# Patient Record
Sex: Female | Born: 1955 | Race: White | Hispanic: No | Marital: Single | Smoking: Never smoker
Health system: Southern US, Community
[De-identification: ages and names within clinical notes are randomized; demographics above are authoritative.]

## PROBLEM LIST (undated history)

## (undated) DIAGNOSIS — I447 Left bundle-branch block, unspecified: Secondary | ICD-10-CM

## (undated) DIAGNOSIS — I10 Essential (primary) hypertension: Secondary | ICD-10-CM

## (undated) DIAGNOSIS — F32A Depression, unspecified: Secondary | ICD-10-CM

## (undated) DIAGNOSIS — E039 Hypothyroidism, unspecified: Secondary | ICD-10-CM

## (undated) DIAGNOSIS — F419 Anxiety disorder, unspecified: Secondary | ICD-10-CM

## (undated) DIAGNOSIS — K760 Fatty (change of) liver, not elsewhere classified: Secondary | ICD-10-CM

## (undated) DIAGNOSIS — G43909 Migraine, unspecified, not intractable, without status migrainosus: Secondary | ICD-10-CM

## (undated) DIAGNOSIS — F329 Major depressive disorder, single episode, unspecified: Secondary | ICD-10-CM

## (undated) DIAGNOSIS — R011 Cardiac murmur, unspecified: Secondary | ICD-10-CM

## (undated) DIAGNOSIS — E669 Obesity, unspecified: Secondary | ICD-10-CM

## (undated) DIAGNOSIS — G4733 Obstructive sleep apnea (adult) (pediatric): Secondary | ICD-10-CM

## (undated) DIAGNOSIS — J309 Allergic rhinitis, unspecified: Secondary | ICD-10-CM

## (undated) DIAGNOSIS — R519 Headache, unspecified: Secondary | ICD-10-CM

## (undated) DIAGNOSIS — E559 Vitamin D deficiency, unspecified: Secondary | ICD-10-CM

## (undated) DIAGNOSIS — E785 Hyperlipidemia, unspecified: Secondary | ICD-10-CM

## (undated) DIAGNOSIS — I5189 Other ill-defined heart diseases: Secondary | ICD-10-CM

## (undated) DIAGNOSIS — H9319 Tinnitus, unspecified ear: Secondary | ICD-10-CM

## (undated) DIAGNOSIS — T148XXA Other injury of unspecified body region, initial encounter: Secondary | ICD-10-CM

## (undated) DIAGNOSIS — R51 Headache: Secondary | ICD-10-CM

## (undated) DIAGNOSIS — R7303 Prediabetes: Secondary | ICD-10-CM

## (undated) DIAGNOSIS — M1990 Unspecified osteoarthritis, unspecified site: Secondary | ICD-10-CM

## (undated) DIAGNOSIS — D126 Benign neoplasm of colon, unspecified: Secondary | ICD-10-CM

## (undated) DIAGNOSIS — I8390 Asymptomatic varicose veins of unspecified lower extremity: Secondary | ICD-10-CM

## (undated) DIAGNOSIS — G5600 Carpal tunnel syndrome, unspecified upper limb: Secondary | ICD-10-CM

## (undated) DIAGNOSIS — K219 Gastro-esophageal reflux disease without esophagitis: Secondary | ICD-10-CM

## (undated) HISTORY — DX: Vitamin D deficiency, unspecified: E55.9

## (undated) HISTORY — PX: TONSILECTOMY, ADENOIDECTOMY, BILATERAL MYRINGOTOMY AND TUBES: SHX2538

## (undated) HISTORY — DX: Other ill-defined heart diseases: I51.89

## (undated) HISTORY — DX: Left bundle-branch block, unspecified: I44.7

## (undated) HISTORY — DX: Anxiety disorder, unspecified: F41.9

## (undated) HISTORY — DX: Obstructive sleep apnea (adult) (pediatric): G47.33

## (undated) HISTORY — DX: Hypothyroidism, unspecified: E03.9

## (undated) HISTORY — DX: Depression, unspecified: F32.A

## (undated) HISTORY — DX: Gastro-esophageal reflux disease without esophagitis: K21.9

## (undated) HISTORY — DX: Cardiac murmur, unspecified: R01.1

## (undated) HISTORY — DX: Migraine, unspecified, not intractable, without status migrainosus: G43.909

## (undated) HISTORY — DX: Allergic rhinitis, unspecified: J30.9

## (undated) HISTORY — DX: Headache: R51

## (undated) HISTORY — PX: OTHER SURGICAL HISTORY: SHX169

## (undated) HISTORY — DX: Fatty (change of) liver, not elsewhere classified: K76.0

## (undated) HISTORY — DX: Carpal tunnel syndrome, unspecified upper limb: G56.00

## (undated) HISTORY — DX: Headache, unspecified: R51.9

## (undated) HISTORY — DX: Obesity, unspecified: E66.9

## (undated) HISTORY — DX: Tinnitus, unspecified ear: H93.19

## (undated) HISTORY — DX: Other injury of unspecified body region, initial encounter: T14.8XXA

## (undated) HISTORY — DX: Major depressive disorder, single episode, unspecified: F32.9

## (undated) HISTORY — PX: DERMOID CYST  EXCISION: SHX1452

## (undated) HISTORY — DX: Asymptomatic varicose veins of unspecified lower extremity: I83.90

## (undated) HISTORY — DX: Prediabetes: R73.03

## (undated) HISTORY — DX: Hyperlipidemia, unspecified: E78.5

## (undated) HISTORY — DX: Benign neoplasm of colon, unspecified: D12.6

## (undated) HISTORY — DX: Unspecified osteoarthritis, unspecified site: M19.90

## (undated) HISTORY — DX: Essential (primary) hypertension: I10

---

## 1999-04-04 ENCOUNTER — Encounter: Payer: Self-pay | Admitting: Obstetrics and Gynecology

## 1999-04-04 ENCOUNTER — Ambulatory Visit (HOSPITAL_COMMUNITY): Admission: RE | Admit: 1999-04-04 | Discharge: 1999-04-04 | Payer: Self-pay | Admitting: Obstetrics and Gynecology

## 1999-08-02 ENCOUNTER — Inpatient Hospital Stay (HOSPITAL_COMMUNITY): Admission: AD | Admit: 1999-08-02 | Discharge: 1999-08-03 | Payer: Self-pay | Admitting: Obstetrics and Gynecology

## 2000-04-02 ENCOUNTER — Ambulatory Visit (HOSPITAL_COMMUNITY): Admission: RE | Admit: 2000-04-02 | Discharge: 2000-04-02 | Payer: Self-pay | Admitting: Obstetrics and Gynecology

## 2000-04-02 ENCOUNTER — Encounter: Payer: Self-pay | Admitting: Obstetrics and Gynecology

## 2000-06-26 ENCOUNTER — Encounter: Admission: RE | Admit: 2000-06-26 | Discharge: 2000-06-26 | Payer: Self-pay | Admitting: Internal Medicine

## 2000-06-26 ENCOUNTER — Encounter: Payer: Self-pay | Admitting: Internal Medicine

## 2002-04-03 ENCOUNTER — Other Ambulatory Visit: Admission: RE | Admit: 2002-04-03 | Discharge: 2002-04-03 | Payer: Self-pay | Admitting: Obstetrics and Gynecology

## 2002-06-17 ENCOUNTER — Ambulatory Visit (HOSPITAL_COMMUNITY): Admission: RE | Admit: 2002-06-17 | Discharge: 2002-06-17 | Payer: Self-pay | Admitting: Internal Medicine

## 2003-04-14 ENCOUNTER — Other Ambulatory Visit: Admission: RE | Admit: 2003-04-14 | Discharge: 2003-04-14 | Payer: Self-pay | Admitting: Obstetrics and Gynecology

## 2004-04-21 ENCOUNTER — Other Ambulatory Visit: Admission: RE | Admit: 2004-04-21 | Discharge: 2004-04-21 | Payer: Self-pay | Admitting: Obstetrics and Gynecology

## 2011-03-01 ENCOUNTER — Other Ambulatory Visit: Payer: Self-pay | Admitting: Otolaryngology

## 2011-03-07 ENCOUNTER — Ambulatory Visit
Admission: RE | Admit: 2011-03-07 | Discharge: 2011-03-07 | Disposition: A | Discharge status: Home / Self Care | Payer: Managed Care, Other (non HMO) | Source: Ambulatory Visit | Attending: Otolaryngology | Admitting: Otolaryngology

## 2011-06-15 ENCOUNTER — Encounter: Payer: Self-pay | Admitting: Cardiovascular Disease

## 2011-06-15 ENCOUNTER — Ambulatory Visit (INDEPENDENT_AMBULATORY_CARE_PROVIDER_SITE_OTHER): Payer: Managed Care, Other (non HMO) | Admitting: Cardiovascular Disease

## 2011-06-15 VITALS — BP 128/80 | HR 64 | Ht 64.5 in | Wt 242.0 lb

## 2011-06-15 DIAGNOSIS — I447 Left bundle-branch block, unspecified: Secondary | ICD-10-CM

## 2011-06-15 NOTE — Progress Notes (Signed)
Carolyn Mcguire Date of Birth  07/18/1956 [facility] / [facility] [address]     [address] [place], [place]  [address] [phone]  Fax  [phone]  History of Present Illness:  Healthy middle age female,  No chest pain.  Gets dyspnea with activity.  Does not exercise regularly.  An ECG at a recent physical revealed a LBBB.  She has not had any cardiac problems.  Current Outpatient Prescriptions  Medication Sig Dispense Refill  . ALPRAZolam (XANAX) 1 MG tablet Take 1 mg by mouth at bedtime as needed.        Marland Kitchen levothyroxine (SYNTHROID, LEVOTHROID) 100 MCG tablet Take 100 mcg by mouth daily.        . NORETHINDRONE PO Take 0.35 mg by mouth daily.        . rosuvastatin (CRESTOR) 40 MG tablet Take 40 mg by mouth daily. TAKE 1/4       . sertraline (ZOLOFT) 50 MG tablet Take 25 mg by mouth daily.        . valsartan (DIOVAN) 320 MG tablet Take 320 mg by mouth daily. 1/2 TABLET          Allergies  Allergen Reactions  . Sulfur     MAKES PT RED    Past Medical History  Diagnosis Date  . Hypertension   . Hyperlipidemia   . Carpal tunnel syndrome     RIGHT HAND    Past Surgical History  Procedure Date  . Hemithyroidectomy   . Rupture belly button     WHEN SHE WAS AN INFANT  . Dermoid cyst  excision     OF HER RIGHT OVARY. SIZE OF A GRAPEFRUIT. OVARY IS STILL THERE.  . Tonsilectomy, adenoidectomy, bilateral myringotomy and tubes     AGE 23    History  Smoking status  . Never Smoker   Smokeless tobacco  . Not on file    History  Alcohol Use No    History reviewed. No pertinent family history.  Reviw of Systems:  Reviewed in the HPI.  All other systems are negative.  Physical Exam: BP 128/80  Pulse 64  Ht 5' 4.5" (1.638 m)  Wt 242 lb (109.77 kg)  BMI 40.90 kg/m2 The patient is alert and oriented x 3.  The mood and affect are normal.   Skin: warm and dry.  Color is normal.    HEENT:   the sclera are nonicteric.  The mucous  membranes are moist.  The carotids are 2+ without bruits.  There is no thyromegaly.  There is no JVD.    Lungs: clear.  The chest wall is non tender.    Heart: regular rate with a normal S1 and S2.  There are no murmurs, gallops, or rubs. The PMI is not displaced.     Abdomin: good bowel sounds.  There is no guarding or rebound.  There is no hepatosplenomegaly or tenderness.  There are no masses.   Extremities:  no clubbing, cyanosis, or edema.  The legs are without rashes.  The distal pulses are intact.   Neuro:  Cranial nerves II - XII are intact.  Motor and sensory functions are intact.    The gait is normal.  ECG: NSR. LBBB  Assessment / Plan:

## 2011-06-15 NOTE — Assessment & Plan Note (Signed)
Carolyn Mcguire presents with an asymptomatic LBBB.  We will get an echocardiogram for further evaluation of her LV function.  We will also need to get a Lexiscan myoview to rule out coronary ischemia as a cause of her LBBB.  She will follow up with [place] in several months.

## 2011-06-18 ENCOUNTER — Encounter: Payer: Self-pay | Admitting: Cardiovascular Disease

## 2011-06-19 ENCOUNTER — Encounter: Payer: Self-pay | Admitting: *Deleted

## 2011-07-02 ENCOUNTER — Other Ambulatory Visit (HOSPITAL_COMMUNITY): Payer: Managed Care, Other (non HMO) | Admitting: Radiology

## 2011-07-05 ENCOUNTER — Ambulatory Visit (HOSPITAL_COMMUNITY): Payer: Managed Care, Other (non HMO) | Attending: Cardiovascular Disease | Admitting: Radiology

## 2011-07-05 ENCOUNTER — Ambulatory Visit (HOSPITAL_BASED_OUTPATIENT_CLINIC_OR_DEPARTMENT_OTHER): Payer: Managed Care, Other (non HMO) | Admitting: Radiology

## 2011-07-05 VITALS — Ht 64.0 in | Wt 240.0 lb

## 2011-07-05 DIAGNOSIS — I447 Left bundle-branch block, unspecified: Secondary | ICD-10-CM

## 2011-07-05 DIAGNOSIS — R0989 Other specified symptoms and signs involving the circulatory and respiratory systems: Secondary | ICD-10-CM

## 2011-07-05 MED ORDER — TECHNETIUM TC 99M TETROFOSMIN IV KIT
33.0000 | PACK | Freq: Once | INTRAVENOUS | Status: AC | PRN
  Administered 2011-07-05: 33 via INTRAVENOUS

## 2011-07-05 MED ORDER — ADENOSINE (DIAGNOSTIC) 3 MG/ML IV SOLN
0.5600 mg/kg | Freq: Once | INTRAVENOUS | Status: AC
  Administered 2011-07-05: 60.9 mg via INTRAVENOUS

## 2011-07-05 NOTE — Progress Notes (Signed)
[facility] SITE 3 NUCLEAR MED [address] [place] [address] [phone]  Cardiology Nuclear Med Study  Carolyn Mcguire is a 55 y.o. female [id number] Sep 25, 1956   Nuclear Med Background Indication for Stress Test:  Evaluation for Ischemia and Abnormal EKG: New LBBB History:  No previous documented CAD Cardiac Risk Factors: Family History - CAD, Hypertension, LBBB and Lipids  Symptoms:  DOE and Rapid HR   Nuclear Pre-Procedure Caffeine/Decaff Intake:  None NPO After: 8:00pm   Lungs:  Clear IV 0.9% NS with Angio Cath:  24g  IV Site: L Antecubital  IV Started by:  Irean Hong, RN  Chest Size (in):  44 Cup Size: DD  Height: 5\' 4"  (1.626 m)  Weight:  240 lb (108.863 kg)  BMI:  Body mass index is 41.20 kg/(m^2). Tech Comments:  n/a    Nuclear Med Study 1 or 2 day study: 2 day  Stress Test Type:  Adenosine  Reading MD: Cassell Clement, MD  Order Authorizing Provider:  Laurice Record, MD  Resting Radionuclide: Technetium 62m Tetrofosmin  Resting Radionuclide Dose: 33.0 mCi   Stress Radionuclide:  Technetium 40m Tetrofosmin  Stress Radionuclide Dose: 33.0 mCi           Stress Protocol Rest HR: 65 Stress HR: 85  Rest BP: 140/88 Stress BP: 122/70  Exercise Time (min): n/a METS: n/a   Predicted Max HR: 168 bpm % Max HR: 50.6 bpm Rate Pressure Product: 10272   Dose of Adenosine (mg):  60.0 Dose of Lexiscan: n/a mg  Dose of Atropine (mg): n/a Dose of Dobutamine: n/a mcg/kg/min (at max HR)  Stress Test Technologist: Irean Hong, RN  Nuclear Technologist:  Doyne Keel, CNMT     Rest Procedure:  Myocardial perfusion imaging was performed at rest 45 minutes following the intravenous administration of Technetium 74m Tetrofosmin. Rest ECG: NSR with poor R wave progression; LBBB  Stress Procedure:  The patient received IV adenosine at 140 mcg/kg/min for 4 minutes.  The EKG was nondiagnostic due to baseline LBBB.  Technetium 51m Tetrofosmin was injected  at the 2 minute mark and quantitative spect images were obtained after a 45 minute delay. Stress ECG: No significant change from baseline ECG  QPS Raw Data Images:  Mild breast attenuation.  Normal left ventricular size. Stress Images:  There is mild anterior and apical attenuation with normal uptake in the other regions. Rest Images:  There is mild anterior and apical attenuation with normal uptake in the other regions Subtraction (SDS):  No evidence of ischemia. Transient Ischemic Dilatation (Normal <1.22):  1.03 Lung/Heart Ratio (Normal <0.45):  0.40  Quantitative Gated Spect Images QGS EDV:  98 ml QGS ESV:  33 ml QGS cine images:  NL LV Function; NL Wall Motion QGS EF: 66%  Impression Exercise Capacity:  Adenosine study with no exercise. BP Response:  Normal blood pressure response. Clinical Symptoms:  No chest pain. ECG Impression:  No significant ST segment change suggestive of ischemia. Comparison with Prior Nuclear Study: No previous nuclear study performed  Overall Impression:  Normal stress nuclear study.  No evidence of ischemia. There is mild breast attenuation.   Normal LV function.    Vesta Mixer, Montez Hageman., MD, [facility]

## 2011-07-10 ENCOUNTER — Telehealth: Payer: Self-pay | Admitting: Cardiology

## 2011-07-10 NOTE — Telephone Encounter (Signed)
Patient received statement, calling regarding getting bill and advised had pd with [employer].  Advised must have crossed in mail.  No balance for 05/2011 dos.  Confirmed [employer] posted 53.21 on account.  Patient ok.  stmt date 07/03/11, posted 07/05/11.  Crossed in mail.  Patient ok.

## 2011-07-16 ENCOUNTER — Ambulatory Visit (HOSPITAL_COMMUNITY): Payer: Managed Care, Other (non HMO) | Attending: Cardiovascular Disease | Admitting: Radiology

## 2011-07-16 DIAGNOSIS — R0600 Dyspnea, unspecified: Secondary | ICD-10-CM

## 2011-07-16 DIAGNOSIS — R0989 Other specified symptoms and signs involving the circulatory and respiratory systems: Secondary | ICD-10-CM

## 2011-07-16 MED ORDER — TECHNETIUM TC 99M TETROFOSMIN IV KIT
33.0000 | PACK | Freq: Once | INTRAVENOUS | Status: AC | PRN
  Administered 2011-07-16: 33 via INTRAVENOUS

## 2011-07-25 ENCOUNTER — Telehealth: Payer: Self-pay | Admitting: Cardiovascular Disease

## 2011-07-25 NOTE — Telephone Encounter (Signed)
Needs last consult on 7/20 faxed over.

## 2011-07-25 NOTE — Telephone Encounter (Signed)
Consult for 7/20 faxed today

## 2011-09-14 ENCOUNTER — Ambulatory Visit (INDEPENDENT_AMBULATORY_CARE_PROVIDER_SITE_OTHER): Payer: Managed Care, Other (non HMO) | Admitting: Cardiovascular Disease

## 2011-09-14 ENCOUNTER — Encounter: Payer: Self-pay | Admitting: Cardiovascular Disease

## 2011-09-14 VITALS — BP 129/70 | HR 75 | Ht 64.5 in | Wt 233.8 lb

## 2011-09-14 DIAGNOSIS — I447 Left bundle-branch block, unspecified: Secondary | ICD-10-CM

## 2011-09-14 NOTE — Assessment & Plan Note (Addendum)
She has had an unremarkable workup.  Will see her in 1 year.  We'll continue to follow her left bundle branch block

## 2011-09-14 NOTE — Progress Notes (Signed)
Carolyn Mcguire Date of Birth  November 05, 1956 [facility] [address]    [address] [place], [place]  [address] [phone]  Fax  [phone]  History of Present Illness:  55 yo female with a hx of LBBB.  Stress myoview was negative.  She's been on a diet and exercise program and has lost about 20 pounds since last first.   Current Outpatient Prescriptions on File Prior to Visit  Medication Sig Dispense Refill  . ALPRAZolam (XANAX) 1 MG tablet Take 1 mg by mouth at bedtime as needed.        Marland Kitchen levothyroxine (SYNTHROID, LEVOTHROID) 100 MCG tablet Take 100 mcg by mouth daily.        . NORETHINDRONE PO Take 0.35 mg by mouth daily.        . rosuvastatin (CRESTOR) 40 MG tablet Take 40 mg by mouth daily. TAKE 1/4       . valsartan (DIOVAN) 320 MG tablet Take 320 mg by mouth daily. 1/2 TABLET       . sertraline (ZOLOFT) 50 MG tablet Take 25 mg by mouth daily.          Allergies  Allergen Reactions  . Sulfa Antibiotics Rash    Past Medical History  Diagnosis Date  . Hypertension   . Hyperlipidemia   . Carpal tunnel syndrome     RIGHT HAND    Past Surgical History  Procedure Date  . Hemithyroidectomy   . Rupture belly button     WHEN SHE WAS AN INFANT  . Dermoid cyst  excision     OF HER RIGHT OVARY. SIZE OF A GRAPEFRUIT. OVARY IS STILL THERE.  . Tonsilectomy, adenoidectomy, bilateral myringotomy and tubes     AGE 6    History  Smoking status  . Never Smoker   Smokeless tobacco  . Not on file    History  Alcohol Use No    History reviewed. No pertinent family history.  Reviw of Systems:  Reviewed in the HPI.  All other systems are negative.  Physical Exam: BP 129/70  Pulse 75  Ht 5' 4.5" (1.638 m)  Wt 233 lb 12.8 oz (106.051 kg)  BMI 39.51 kg/m2 The patient is alert and oriented x 3.  The mood and affect are normal.   Skin: warm and dry.  Color is normal.    HEENT:   Normal carotids, no JVD  Lungs: clear   Heart: RR    Abdomen: normal BS, non  tender  Extremities:  No edema  Neuro:  nonfocal      ECG:  Assessment / Plan:

## 2011-09-14 NOTE — Patient Instructions (Addendum)
Your physician wants you to follow-up in: 1 year, You will receive a reminder letter in the mail two months in advance. If you don't receive a letter, please call our office to schedule the follow-up appointment.  Your physician recommends that you continue on your current medications as directed. Please refer to the Current Medication list given to you today. 

## 2012-03-27 ENCOUNTER — Encounter: Payer: Self-pay | Admitting: Internal Medicine

## 2012-04-16 ENCOUNTER — Ambulatory Visit: Payer: Managed Care, Other (non HMO) | Admitting: Internal Medicine

## 2012-04-18 ENCOUNTER — Encounter: Payer: Self-pay | Admitting: Internal Medicine

## 2012-04-23 ENCOUNTER — Encounter: Payer: Self-pay | Admitting: Internal Medicine

## 2012-04-23 ENCOUNTER — Ambulatory Visit (INDEPENDENT_AMBULATORY_CARE_PROVIDER_SITE_OTHER): Payer: Managed Care, Other (non HMO) | Admitting: Internal Medicine

## 2012-04-23 VITALS — BP 138/82 | HR 84 | Ht 64.5 in | Wt 232.4 lb

## 2012-04-23 DIAGNOSIS — I10 Essential (primary) hypertension: Secondary | ICD-10-CM | POA: Insufficient documentation

## 2012-04-23 DIAGNOSIS — K5900 Constipation, unspecified: Secondary | ICD-10-CM

## 2012-04-23 DIAGNOSIS — Z1211 Encounter for screening for malignant neoplasm of colon: Secondary | ICD-10-CM

## 2012-04-23 MED ORDER — PEG-KCL-NACL-NASULF-NA ASC-C 100 G PO SOLR
1.0000 | Freq: Once | ORAL | Status: DC
Start: 2012-04-23 — End: 2012-10-06

## 2012-04-23 NOTE — Progress Notes (Signed)
Subjective:    Patient ID: Carolyn Mcguire, female    DOB: 09-Oct-1956, 56 y.o.   MRN: [id number]  HPI Mrs. Kobel is a 56 yo female with PMH of hypertension, hyperlipidemia, hypothyroidism who is seen in consultation at the request of Dr. Renne Crigler for evaluation of constipation and colon cancer screening. The patient developed change in her bowel habits which consists of constipation approximately a month ago. Was associated with some lower abdominal pain and worsening of hemorrhoids. She reports that the constipation is no longer a problem for her, and she feels it was secondary to increasing her dose of nortriptyline. She subsequently discontinued the nortriptyline and her constipation resolved completely. She is now returned to her normal bowel pattern which is 2 formed stools daily. She is having no rectal bleeding or melena. Occasionally she does see scant bright red blood on the toilet tissue with wiping which she is related to hemorrhoids. She's had no abdominal pain now. No nausea or vomiting. No heartburn. No dysphagia or odynophagia. She does recall a prior colonoscopy done by Dr. Virginia Rochester for rectal bleeding which was reportedly negative.  Review of Systems As per history of present illness, otherwise negative  Past Medical History  Diagnosis Date  . Hypertension   . Hyperlipidemia   . Carpal tunnel syndrome     RIGHT HAND  . Hyperthyroidism    Past Surgical History  Procedure Date  . Hemithyroidectomy   . Rupture belly button     WHEN SHE WAS AN INFANT  . Dermoid cyst  excision     OF HER RIGHT OVARY. SIZE OF A GRAPEFRUIT. OVARY IS STILL THERE.  . Tonsilectomy, adenoidectomy, bilateral myringotomy and tubes     AGE 26   Current Outpatient Prescriptions  Medication Sig Dispense Refill  . ALPRAZolam (XANAX) 1 MG tablet Take 1 mg by mouth at bedtime as needed.        . Ibuprofen (ADVIL PO) Take by mouth as needed.        Marland Kitchen levothyroxine (SYNTHROID, LEVOTHROID) 100 MCG tablet Take 100  mcg by mouth daily.        . NORETHINDRONE PO Take 0.35 mg by mouth daily.        . peg 3350 powder (MOVIPREP) 100 G SOLR Take 1 kit (100 g total) by mouth once.  1 kit  0  . rosuvastatin (CRESTOR) 40 MG tablet Take 40 mg by mouth daily. TAKE 1/4       . valsartan (DIOVAN) 320 MG tablet Take 320 mg by mouth daily. 1/2 TABLET        Allergies  Allergen Reactions  . Sulfa Antibiotics Rash   Family History  Problem Relation Age of Onset  . Ovarian cancer Mother   . Diabetes Mother   . Colon cancer Maternal Uncle   . Ovarian cancer Maternal Grandmother    History  Substance Use Topics  . Smoking status: Never Smoker   . Smokeless tobacco: Never Used  . Alcohol Use: No        Objective:   Physical Exam BP 138/82  Pulse 84  Ht 5' 4.5" (1.638 m)  Wt 232 lb 6.4 oz (105.416 kg)  BMI 39.28 kg/m2 Constitutional: Well-developed and well-nourished. No distress. HEENT: Normocephalic and atraumatic. Oropharynx is clear and moist. No oropharyngeal exudate. Conjunctivae are normal. Pupils are equal round and reactive to light. No scleral icterus. Neck: Neck supple. Trachea midline. Cardiovascular: Normal rate, regular rhythm and intact distal pulses. No M/R/G Pulmonary/chest: Effort  normal and breath sounds normal. No wheezing, rales or rhonchi. Abdominal: Soft, nontender, nondistended. Bowel sounds active throughout. There are no masses palpable. No hepatosplenomegaly. Extremities: no clubbing, cyanosis, or edema Lymphadenopathy: No cervical adenopathy noted. Neurological: Alert and oriented to person place and time. Skin: Skin is warm and dry. No rashes noted. Psychiatric: Normal mood and affect. Behavior is normal.  Labs reviewed and received from [facility] dated 03/28/2012 WBC 6.4, hemoglobin 14.0, hematocrit 41.9, MCV 89.0, platelet 223 Sodium 140, potassium 3.8, chloride 104, CO2 29, BUN 16, creatinine 0.9, glucose 1:30 Calcium 8.5, total protein 7.0, albumin  4.0 Total bili 0.8, alk phosphatase 97, AST 21, ALT 26 TSH 1.090  Imaging reviewed, a one view abdominal film dated 03/27/2012 Impression no plain film finding for an acute abdominal process.  Colonoscopy report date 09/10/1996, Dr. Sabino Gasser -- exam to the cecum. Findings "essentially normal colonoscopic exam to the cecum"    Assessment & Plan:   56 yo female with PMH of hypertension, hyperlipidemia, hypothyroidism who is seen in consultation at the request of Dr. Renne Crigler for evaluation of constipation and colon cancer screening.  1. CRC screening/constipation -- the patient's constipation has resolved, and very well was likely due to the anticholinergic properties of nortriptyline. She is due for her colorectal cancer screening, and given her age. We discussed the test today including the risks and benefits and she is agreeable to proceed. Nothing further needed for constipation, as this has resolved.

## 2012-04-23 NOTE — Patient Instructions (Signed)
You have been scheduled for a colonoscopy with propofol. Please follow written instructions given to you at your visit today.  Please pick up your prep kit at the pharmacy within the next 1-3 days.  We have sent the following medications to your pharmacy for you to pick up at your convenience: Moviprep; you were given instructions today at your visit.

## 2012-05-06 ENCOUNTER — Ambulatory Visit (AMBULATORY_SURGERY_CENTER): Payer: Managed Care, Other (non HMO) | Admitting: Internal Medicine

## 2012-05-06 ENCOUNTER — Encounter: Payer: Self-pay | Admitting: Internal Medicine

## 2012-05-06 VITALS — BP 167/77 | HR 76 | Temp 95.7°F | Resp 13 | Ht 64.5 in | Wt 232.0 lb

## 2012-05-06 DIAGNOSIS — Z1211 Encounter for screening for malignant neoplasm of colon: Secondary | ICD-10-CM

## 2012-05-06 DIAGNOSIS — D126 Benign neoplasm of colon, unspecified: Secondary | ICD-10-CM

## 2012-05-06 DIAGNOSIS — K5900 Constipation, unspecified: Secondary | ICD-10-CM

## 2012-05-06 MED ORDER — SODIUM CHLORIDE 0.9 % IV SOLN: 500.0000 mL | INTRAVENOUS | Status: DC

## 2012-05-06 NOTE — Patient Instructions (Signed)

## 2012-05-06 NOTE — Progress Notes (Signed)
Patient did not experience any of the following events: a burn prior to discharge; a fall within the facility; wrong site/side/patient/procedure/implant event; or a hospital transfer or hospital admission upon discharge from the facility. (G8907) Patient did not have preoperative order for IV antibiotic SSI prophylaxis. (G8918)  

## 2012-05-06 NOTE — Op Note (Signed)
[facility] [address] [place], [place]  [address]  COLONOSCOPY PROCEDURE REPORT  PATIENT:  Carolyn, Mcguire  MR#:  [id number] BIRTHDATE:  1955-12-29, 55 yrs. old  GENDER:  female ENDOSCOPIST:  Carie Caddy. Reilley Valentine, MD REF. BY:  Soyla Murphy. Renne Crigler, M.D. PROCEDURE DATE:  05/06/2012 PROCEDURE:  Colon with cold biopsy polypectomy, Colonoscopy with snare polypectomy ASA CLASS:  Class II INDICATIONS:  Routine Risk Screening MEDICATIONS:   MAC sedation, administered by CRNA, propofol (Diprivan) 470 mg IV  DESCRIPTION OF PROCEDURE:   After the risks benefits and alternatives of the procedure were thoroughly explained, informed consent was obtained.  Digital rectal exam was performed and revealed no rectal masses.   The LB CF-[id number] endoscope was introduced through the anus and advanced to the terminal ileum which was intubated for a short distance, without limitations. The quality of the prep was good, using MoviPrep.  The instrument was then slowly withdrawn as the colon was fully examined. <<PROCEDUREIMAGES>>  FINDINGS:  A 4 mm sessile polyp was found in the ascending colon. Polyp was snared without cautery. Retrieval was successful.  Two diminutive polyps, 2 -3 mm, were found in the sigmoid colon. The polyps were removed using cold biopsy forceps.  Otherwise normal colonoscopy without other polyps, masses, vascular ectasias, or inflammatory changes.   Retroflexed views in the rectum revealed no abnormalities.  The scope was then withdrawn from the cecum and the procedure completed.  COMPLICATIONS:  None  ENDOSCOPIC IMPRESSION: 1) Sessile polyp in the ascending colon. Removed and sent to pathology. 2) Two polyps in the sigmoid colon. Removed and sent to pathology. 3) Otherwise normal colonoscopy  RECOMMENDATIONS: 1) Await pathology results 2) If the polyps removed today are proven to be adenomatous (pre-cancerous) polyps, you will need a colonoscopy in 3  years. However, if only 1 of the polyps is adenomatous, you would need repeat colonoscopy in 5 years.  Otherwise you should continue to follow colorectal cancer screening guidelines for "routine risk" patients with a colonoscopy in 10 years. You will receive a letter within 1-2 weeks with the results of your biopsy as well as final recommendations. Please call my office if you have not received a letter after 3 weeks.  Carie Caddy. [name], MD  CC:  The Patient Romero Liner, MD  n. Rosalie DoctorCarie Caddy. Winfred Iiams at 05/06/2012 09:48 AM  Lillette Boxer, [phone]

## 2012-05-06 NOTE — Progress Notes (Signed)
The pt tolerated the colonoscopy very well. Maw   

## 2012-05-07 ENCOUNTER — Telehealth: Payer: Self-pay

## 2012-05-07 NOTE — Telephone Encounter (Signed)
  Follow up Call-  Call back number 05/06/2012  Post procedure Call Back phone  # [phone]  Permission to leave phone message Yes     Patient questions:  Do you have a fever, pain , or abdominal swelling? no Pain Score  0 *  Have you tolerated food without any problems? yes  Have you been able to return to your normal activities? yes  Do you have any questions about your discharge instructions: Diet   no Medications  no Follow up visit  no  Do you have questions or concerns about your Care? no  Actions: * If pain score is 4 or above: No action needed, pain <4.  No problems per the pt . Maw

## 2012-05-12 ENCOUNTER — Telehealth: Payer: Self-pay | Admitting: Internal Medicine

## 2012-05-12 NOTE — Telephone Encounter (Signed)
She really should not be swollen a week after a colonoscopy- may want to take a laxative -bottle of mag citrate or 3-4 doses of miralax in one day just to get things moving.Marland KitchenMarland Kitchen

## 2012-05-12 NOTE — Telephone Encounter (Signed)
Informed pt of Amy's suggestions who will try a laxative and call if symptom persists.

## 2012-05-12 NOTE — Telephone Encounter (Signed)
Pt had COLON on 05/06/12 and reports her abdomen is swollen. She denies pain, tenderness and other problems; just swelling. She reports normal BMs . Is there anything she can do since she is passing stool? Thanks.

## 2012-05-12 NOTE — Telephone Encounter (Signed)
She should really not be swollen a week after a colonoscopy-would suggest a laxative just to move things thru- bottle of mag citrat or 3-4 doses of miralax in one day see if that helps

## 2012-05-13 ENCOUNTER — Telehealth: Payer: Self-pay | Admitting: Internal Medicine

## 2012-05-13 ENCOUNTER — Ambulatory Visit (INDEPENDENT_AMBULATORY_CARE_PROVIDER_SITE_OTHER)
Admission: RE | Admit: 2012-05-13 | Discharge: 2012-05-13 | Disposition: A | Discharge status: Home / Self Care | Payer: Managed Care, Other (non HMO) | Source: Ambulatory Visit | Attending: Physician Assistant | Admitting: Physician Assistant

## 2012-05-13 DIAGNOSIS — R140 Abdominal distension (gaseous): Secondary | ICD-10-CM

## 2012-05-13 DIAGNOSIS — R1900 Intra-abdominal and pelvic swelling, mass and lump, unspecified site: Secondary | ICD-10-CM

## 2012-05-13 DIAGNOSIS — R141 Gas pain: Secondary | ICD-10-CM

## 2012-05-13 DIAGNOSIS — R142 Eructation: Secondary | ICD-10-CM

## 2012-05-13 NOTE — Telephone Encounter (Signed)
Informed pt that her xray is normal; no free air and the gas pattern is normal; no ileus. Suggested pt try walking and she states she walks daily and walked yesterday; can't get into her clothes. Pt will try walking more and will call later in the week if problem persists.

## 2012-05-13 NOTE — Telephone Encounter (Signed)
Informed pt to come in for xrays today; pt stated understanding

## 2012-05-13 NOTE — Telephone Encounter (Signed)
Pt called yesterday c/o being swollen after her procedure on 05/06/12. She was advised to take either Mag Citrate or 3-4 does of Miralax. Pt reported she did one dose of Miralax because she had already had 3 stools. This am she reports having a normal stool again, but the swelling is still at her belly button up to her breast. Please advise. Thanks.

## 2012-05-13 NOTE — Telephone Encounter (Signed)
Not sure what she is feeling... Check multi abd films today.

## 2012-05-14 ENCOUNTER — Other Ambulatory Visit: Payer: Self-pay | Admitting: Specialist

## 2012-05-14 DIAGNOSIS — M25511 Pain in right shoulder: Secondary | ICD-10-CM

## 2012-05-20 ENCOUNTER — Ambulatory Visit
Admission: RE | Admit: 2012-05-20 | Discharge: 2012-05-20 | Disposition: A | Discharge status: Home / Self Care | Payer: Managed Care, Other (non HMO) | Source: Ambulatory Visit | Attending: Specialist | Admitting: Specialist

## 2012-05-20 ENCOUNTER — Encounter: Payer: Self-pay | Admitting: Internal Medicine

## 2012-05-20 DIAGNOSIS — M25511 Pain in right shoulder: Secondary | ICD-10-CM

## 2012-05-22 ENCOUNTER — Telehealth: Payer: Self-pay | Admitting: Internal Medicine

## 2012-05-23 NOTE — Telephone Encounter (Signed)
Informed pt the letter was mailed this week d/t Dr Lauro Franklin vacation. Reviewed the letter with pt; pt stated understanding.

## 2012-10-06 ENCOUNTER — Encounter: Payer: Self-pay | Admitting: Cardiovascular Disease

## 2012-10-06 ENCOUNTER — Ambulatory Visit (INDEPENDENT_AMBULATORY_CARE_PROVIDER_SITE_OTHER): Payer: Managed Care, Other (non HMO) | Admitting: Cardiovascular Disease

## 2012-10-06 VITALS — BP 140/76 | HR 61 | Ht 64.5 in | Wt 223.0 lb

## 2012-10-06 DIAGNOSIS — I10 Essential (primary) hypertension: Secondary | ICD-10-CM

## 2012-10-06 DIAGNOSIS — I447 Left bundle-branch block, unspecified: Secondary | ICD-10-CM

## 2012-10-06 NOTE — Progress Notes (Signed)
Carolyn Mcguire Date of Birth  07-Apr-1956 [facility] [address]    [address] [place], [place]  [address] [phone]  Fax  [phone]  Problem list: 1. Left bundle branch block 2. Hypothyroidism 3. Hyperlipidemia 4. Hypertension   History of Present Illness:  56 yo female with a hx of LBBB.  Stress myoview was negative.  She's been on a diet and exercise program and has lost about 6 pounds since last year.  She is drinking more water, avoiding fast foods.  She is walking regularly.  She has been diagnosed with migraines.  She thinks muscle tension may be contributing to her headaches.    Current Outpatient Prescriptions on File Prior to Visit  Medication Sig Dispense Refill  . levothyroxine (SYNTHROID, LEVOTHROID) 100 MCG tablet Take 100 mcg by mouth daily.        . rosuvastatin (CRESTOR) 40 MG tablet Take 40 mg by mouth daily. TAKE 1/4       . topiramate (TOPAMAX) 25 MG tablet       . valsartan (DIOVAN) 320 MG tablet Take 320 mg by mouth daily. 1/2 TABLET         Allergies  Allergen Reactions  . Sulfa Antibiotics Rash    Past Medical History  Diagnosis Date  . Hypertension   . Hyperlipidemia   . Carpal tunnel syndrome     RIGHT HAND  . Anxiety   . Depression   . Arthritis     shoulder  . GERD (gastroesophageal reflux disease)   . Hyperthyroidism     Past Surgical History  Procedure Date  . Hemithyroidectomy   . Rupture belly button     WHEN SHE WAS AN INFANT  . Dermoid cyst  excision     OF HER RIGHT OVARY. SIZE OF A GRAPEFRUIT. OVARY IS STILL THERE.  . Tonsilectomy, adenoidectomy, bilateral myringotomy and tubes     AGE 16, pt states she didn't have tubes put into her ears    History  Smoking status  . Never Smoker   Smokeless tobacco  . Never Used    History  Alcohol Use No    Family History  Problem Relation Age of Onset  . Ovarian cancer Mother   . Diabetes Mother   . Colon cancer Maternal Uncle   . Ovarian cancer Maternal  Grandmother   . Esophageal cancer Neg Hx   . Rectal cancer Neg Hx   . Stomach cancer Neg Hx     Reviw of Systems:  Reviewed in the HPI.  All other systems are negative.  Physical Exam: BP 140/76  Pulse 61  Ht 5' 4.5" (1.638 m)  Wt 223 lb (101.152 kg)  BMI 37.69 kg/m2  SpO2 99% The patient is alert and oriented x 3.  The mood and affect are normal.   Skin: warm and dry.  Color is normal.    HEENT:   Normal carotids, no JVD  Lungs: clear   Heart: RR    Abdomen: normal BS, non tender  Extremities:  No edema  Neuro:  nonfocal      ECG: Nov. 11, 2013 - NSR at 61,   LBBB, no changes from previous tracing  Assessment / Plan:

## 2012-10-06 NOTE — Assessment & Plan Note (Signed)
Her left bundle block is unchanged. She'll continue with her same medications. We'll have her followup with her general medical doctor for this and her hypertension. I'll plan on seeing her again in 4-5 years

## 2012-10-06 NOTE — Assessment & Plan Note (Signed)
Blood pressure has been well-controlled. We'll continue with same medications. She will followup with Dr. Renne Crigler.

## 2012-10-06 NOTE — Patient Instructions (Addendum)
Your physician recommends that you schedule a follow-up appointment in: as needed  

## 2014-05-10 ENCOUNTER — Other Ambulatory Visit: Payer: Self-pay | Admitting: Orthopedic Surgery

## 2014-05-10 DIAGNOSIS — M25569 Pain in unspecified knee: Secondary | ICD-10-CM

## 2014-05-16 ENCOUNTER — Other Ambulatory Visit: Payer: Managed Care, Other (non HMO)

## 2014-06-12 ENCOUNTER — Ambulatory Visit
Admission: RE | Admit: 2014-06-12 | Discharge: 2014-06-12 | Disposition: A | Discharge status: Home / Self Care | Payer: Managed Care, Other (non HMO) | Source: Ambulatory Visit | Attending: Orthopedic Surgery | Admitting: Orthopedic Surgery

## 2014-06-12 DIAGNOSIS — M25569 Pain in unspecified knee: Secondary | ICD-10-CM

## 2014-09-30 ENCOUNTER — Other Ambulatory Visit: Payer: Self-pay | Admitting: Dermatology

## 2015-05-27 DIAGNOSIS — T148XXA Other injury of unspecified body region, initial encounter: Secondary | ICD-10-CM

## 2015-05-27 HISTORY — DX: Other injury of unspecified body region, initial encounter: T14.8XXA

## 2015-06-06 ENCOUNTER — Encounter (HOSPITAL_COMMUNITY): Payer: Self-pay | Admitting: Nurse Practitioner

## 2015-06-06 ENCOUNTER — Emergency Department (HOSPITAL_COMMUNITY)
Admission: EM | Admit: 2015-06-06 | Discharge: 2015-06-07 | Disposition: A | Discharge status: Home / Self Care | Payer: BLUE CROSS/BLUE SHIELD | Attending: Emergency Medicine | Admitting: Emergency Medicine

## 2015-06-06 DIAGNOSIS — E785 Hyperlipidemia, unspecified: Secondary | ICD-10-CM | POA: Insufficient documentation

## 2015-06-06 DIAGNOSIS — Y9289 Other specified places as the place of occurrence of the external cause: Secondary | ICD-10-CM | POA: Diagnosis not present

## 2015-06-06 DIAGNOSIS — Z79899 Other long term (current) drug therapy: Secondary | ICD-10-CM | POA: Diagnosis not present

## 2015-06-06 DIAGNOSIS — R110 Nausea: Secondary | ICD-10-CM | POA: Insufficient documentation

## 2015-06-06 DIAGNOSIS — Y998 Other external cause status: Secondary | ICD-10-CM | POA: Insufficient documentation

## 2015-06-06 DIAGNOSIS — S81851A Open bite, right lower leg, initial encounter: Secondary | ICD-10-CM

## 2015-06-06 DIAGNOSIS — Z8739 Personal history of other diseases of the musculoskeletal system and connective tissue: Secondary | ICD-10-CM | POA: Insufficient documentation

## 2015-06-06 DIAGNOSIS — F329 Major depressive disorder, single episode, unspecified: Secondary | ICD-10-CM | POA: Insufficient documentation

## 2015-06-06 DIAGNOSIS — Z23 Encounter for immunization: Secondary | ICD-10-CM | POA: Insufficient documentation

## 2015-06-06 DIAGNOSIS — Y9389 Activity, other specified: Secondary | ICD-10-CM | POA: Insufficient documentation

## 2015-06-06 DIAGNOSIS — S81811A Laceration without foreign body, right lower leg, initial encounter: Secondary | ICD-10-CM | POA: Insufficient documentation

## 2015-06-06 DIAGNOSIS — W5581XA Bitten by other mammals, initial encounter: Secondary | ICD-10-CM | POA: Diagnosis not present

## 2015-06-06 DIAGNOSIS — S8011XA Contusion of right lower leg, initial encounter: Secondary | ICD-10-CM | POA: Diagnosis not present

## 2015-06-06 DIAGNOSIS — Z8719 Personal history of other diseases of the digestive system: Secondary | ICD-10-CM | POA: Insufficient documentation

## 2015-06-06 DIAGNOSIS — I10 Essential (primary) hypertension: Secondary | ICD-10-CM | POA: Diagnosis not present

## 2015-06-06 DIAGNOSIS — E0590 Thyrotoxicosis, unspecified without thyrotoxic crisis or storm: Secondary | ICD-10-CM | POA: Diagnosis not present

## 2015-06-06 DIAGNOSIS — Z8669 Personal history of other diseases of the nervous system and sense organs: Secondary | ICD-10-CM | POA: Insufficient documentation

## 2015-06-06 MED ORDER — RABIES VACCINE, PCEC IM SUSR
1.0000 mL | Freq: Once | INTRAMUSCULAR | Status: AC
  Administered 2015-06-06: 1 mL via INTRAMUSCULAR
  Filled 2015-06-06: qty 1

## 2015-06-06 MED ORDER — RABIES IMMUNE GLOBULIN 150 UNIT/ML IM INJ
20.0000 [IU]/kg | INJECTION | Freq: Once | INTRAMUSCULAR | Status: AC
  Administered 2015-06-06: 2175 [IU]
  Filled 2015-06-06: qty 14.5

## 2015-06-06 MED ORDER — ONDANSETRON 8 MG PO TBDP: 8.0000 mg | ORAL_TABLET | Freq: Three times a day (TID) | ORAL | Status: DC | PRN

## 2015-06-06 MED ORDER — ONDANSETRON 4 MG PO TBDP
8.0000 mg | ORAL_TABLET | Freq: Once | ORAL | Status: AC
  Administered 2015-06-06: 8 mg via ORAL
  Filled 2015-06-06: qty 2

## 2015-06-06 MED ORDER — NAPROXEN 500 MG PO TABS: 500.0000 mg | ORAL_TABLET | Freq: Two times a day (BID) | ORAL | Status: DC

## 2015-06-06 MED ORDER — AMOXICILLIN-POT CLAVULANATE 875-125 MG PO TABS: 1.0000 | ORAL_TABLET | Freq: Two times a day (BID) | ORAL | Status: DC
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
## 2015-06-06 NOTE — ED Provider Notes (Signed)
CSN: [id number]     Arrival date & time 06/06/15  1401 History  This chart was scribed for Jeannett Senior, PA-C, working with Malvin Johns, MD by Steva Colder, ED Scribe. The patient was seen in room TR06C/TR06C at 3:17 PM.    Chief Complaint  Patient presents with  . Animal Bite      The history is provided by the patient. No language interpreter was used.    HPI Comments: Carolyn Mcguire is a 59 y.o. female who presents to the Emergency Department complaining of animal bite onset today PTA. Pt notes that a fox came out from under her car and bit her on the RLE, pt denies being scratched by the fox. Pt reports that after the incident she slung the fox and the fox tried to attack her again and she hurt her ankle trying to avoid the fox. Pt reports that she contacted animal control. She states that she is having associated symptoms of bruising to her LE and nausea. She denies gait problem, rash, and any other symptoms. Pt tetanus is UTD and she last had it 05/23/2011.   Past Medical History  Diagnosis Date  . Hypertension   . Hyperlipidemia   . Carpal tunnel syndrome     RIGHT HAND  . Anxiety   . Depression   . Arthritis     shoulder  . GERD (gastroesophageal reflux disease)   . Hyperthyroidism    Past Surgical History  Procedure Laterality Date  . Hemithyroidectomy    . Rupture belly button      WHEN SHE WAS AN INFANT  . Dermoid cyst  excision      OF HER RIGHT OVARY. SIZE OF A GRAPEFRUIT. OVARY IS STILL THERE.  . Tonsilectomy, adenoidectomy, bilateral myringotomy and tubes      AGE 56, pt states she didn't have tubes put into her ears   Family History  Problem Relation Age of Onset  . Ovarian cancer Mother   . Diabetes Mother   . Colon cancer Maternal Uncle   . Ovarian cancer Maternal Grandmother   . Esophageal cancer Neg Hx   . Rectal cancer Neg Hx   . Stomach cancer Neg Hx    History  Substance Use Topics  . Smoking status: Never Smoker   . Smokeless tobacco:  Never Used  . Alcohol Use: No   OB History    No data available     Review of Systems  Gastrointestinal: Positive for nausea. Negative for vomiting.  Musculoskeletal: Positive for arthralgias. Negative for joint swelling.  Skin: Positive for color change and wound. Negative for rash.      Allergies  Sulfa antibiotics  Home Medications   Prior to Admission medications   Medication Sig Start Date End Date Taking? Authorizing Provider  levothyroxine (SYNTHROID, LEVOTHROID) 100 MCG tablet Take 100 mcg by mouth daily.      Historical Provider, MD  rosuvastatin (CRESTOR) 40 MG tablet Take 40 mg by mouth daily. TAKE 1/4     Historical Provider, MD  topiramate (TOPAMAX) 25 MG tablet  09/09/12   Historical Provider, MD  valsartan (DIOVAN) 320 MG tablet Take 320 mg by mouth daily. 1/2 TABLET     Historical Provider, MD   BP 176/89 mmHg  Pulse 101  Temp(Src) 98.3 F (36.8 C) (Oral)  Resp 16  Ht 5' 4.5" (1.638 m)  Wt 243 lb 7 oz (110.423 kg)  BMI 41.16 kg/m2  SpO2 97% Physical Exam  Constitutional: She is  oriented to person, place, and time. She appears well-developed and well-nourished. No distress.  HENT:  Head: Normocephalic and atraumatic.  Eyes: EOM are normal.  Neck: Neck supple. No tracheal deviation present.  Cardiovascular: Normal rate, regular rhythm and normal heart sounds.   Pulmonary/Chest: Effort normal and breath sounds normal. No respiratory distress. She has no wheezes. She has no rales.  Musculoskeletal: Normal range of motion.  Neurological: She is alert and oriented to person, place, and time.  Skin: Skin is warm and dry.     Psychiatric: She has a normal mood and affect. Her behavior is normal.  Nursing note and vitals reviewed.   ED Course  Procedures (including critical care time) DIAGNOSTIC STUDIES: Oxygen Saturation is 97% on RA, nl by my interpretation.    COORDINATION OF CARE: 3:21 PM-Discussed treatment plan which includes rabies injection  with pt at bedside and pt agreed to plan.   Labs Review Labs Reviewed - No data to display  Imaging Review No results found.   EKG Interpretation None      MDM   Final diagnoses:  Animal bite of lower leg, right, initial encounter    patient with laceration to the right lower leg from [place] bite. Unable to find the fox. Wound irrigated thoroughly, the patient's tetanus is up-to-date. He does not require any repair. Will discharge home on Augmentin, rabies immunoglobulin and first vaccine series administered. Patient is to do wound care at home, follow-up for further vaccination or as needed.  Filed Vitals:   06/06/15 1455 06/06/15 1653  BP: 176/89 173/75  Pulse: 101 97  Temp: 98.3 F (36.8 C) 98.3 F (36.8 C)  TempSrc: Oral Oral  Resp: 16 22  Height: 5' 4.5" (1.638 m)   Weight: 243 lb 7 oz (110.423 kg)   SpO2: 97% 99%    I personally performed the services described in this documentation, which was scribed in my presence. The recorded information has been reviewed and is accurate.    Jeannett Senior, PA-C 06/07/15 [place], MD 06/10/15 [id number]

## 2015-06-06 NOTE — Discharge Instructions (Signed)
Ibuprofen for pain. augmentin for infection prevention. Ice and elevate your leg. Keep it clean. Wash with soap and water. You can apply topical antibiotic ointment. Follow up for further shot series or if you have any problems.     Animal Bite An animal bite can result in a scratch on the skin, deep open cut, puncture of the skin, crush injury, or tearing away of the skin or a body part. Dogs are responsible for most animal bites. Children are bitten more often than adults. An animal bite can range from very mild to more serious. A small bite from your house pet is no cause for alarm. However, some animal bites can become infected or injure a bone or other tissue. You must seek medical care if:  The skin is broken and bleeding does not slow down or stop after 15 minutes.  The puncture is deep and difficult to clean (such as a cat bite).  Pain, warmth, redness, or pus develops around the wound.  The bite is from a stray animal or rodent. There may be a risk of rabies infection.  The bite is from a snake, raccoon, skunk, fox, coyote, or bat. There may be a risk of rabies infection.  The person bitten has a chronic illness such as diabetes, liver disease, or cancer, or the person takes medicine that lowers the immune system.  There is concern about the location and severity of the bite. It is important to clean and protect an animal bite wound right away to prevent infection. Follow these steps:  Clean the wound with plenty of water and soap.  Apply an antibiotic cream.  Apply gentle pressure over the wound with a clean towel or gauze to slow or stop bleeding.  Elevate the affected area above the heart to help stop any bleeding.  Seek medical care. Getting medical care within 8 hours of the animal bite leads to the best possible outcome. DIAGNOSIS  Your caregiver will most likely:  Take a detailed history of the animal and the bite injury.  Perform a wound exam.  Take your medical  history. Blood tests or X-rays may be performed. Sometimes, infected bite wounds are cultured and sent to a lab to identify the infectious bacteria.  TREATMENT  Medical treatment will depend on the location and type of animal bite as well as the patient's medical history. Treatment may include:  Wound care, such as cleaning and flushing the wound with saline solution, bandaging, and elevating the affected area.  Antibiotics.  Tetanus immunization.  Rabies immunization.  Leaving the wound open to heal. This is often done with animal bites, due to the high risk of infection. However, in certain cases, wound closure with stitches, wound adhesive, skin adhesive strips, or staples may be used. Infected bites that are left untreated may require intravenous (IV) antibiotics and surgical treatment in the hospital. [place]  Follow your caregiver's instructions for wound care.  Take all medicines as directed.  If your caregiver prescribes antibiotics, take them as directed. Finish them even if you start to feel better.  Follow up with your caregiver for further exams or immunizations as directed. You may need a tetanus shot if:  You cannot remember when you had your last tetanus shot.  You have never had a tetanus shot.  The injury broke your skin. If you get a tetanus shot, your arm may swell, get red, and feel warm to the touch. This is common and not a problem. If  you need a tetanus shot and you choose not to have one, there is a rare chance of getting tetanus. Sickness from tetanus can be serious. SEEK MEDICAL CARE IF:  You notice warmth, redness, soreness, swelling, pus discharge, or a bad smell coming from the wound.  You have a red line on the skin coming from the wound.  You have a fever, chills, or a general ill feeling.  You have nausea or vomiting.  You have continued or worsening pain.  You have trouble moving the injured part.  You have other questions  or concerns. MAKE SURE YOU:  Understand these instructions.  Will watch your condition.  Will get help right away if you are not doing well or get worse. Document Released: 07/31/2011 Document Revised: 02/04/2012 Document Reviewed: 07/31/2011 [facility] Patient Information 2015 [place], [place]. This information is not intended to replace advice given to you by your health care provider. Make sure you discuss any questions you have with your health care provider.        [facility]     [address] [place], [place] [address]              [phone]                                  [place] PATIENT  Patient's Name: Carolyn Mcguire                     Original Order Date:  06/06/2015  Medical Record Number: [id number]  ED Physician: . Primary Diagnosis: Rabies Exposure       PCP: Horatio Pel, MD . 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 Species of Animal: fox IMMUNOGLOBULIN INJECTION GIVEN IN THE ER?: yes   You have been seen in the Emergency Department for a possible rabies exposure.  You must return for the additional vaccine doses to the [facility] ([place]) next to [facility].  If needed, your immunoglobulin follow-up injections, need to be scheduled for the dates below. If your first visit should fall on a weekend day, please come anytime between the hours of 9am-7pm.  DAY 0:  Date 06/06/15     To: Urgent Care  DAY 3:  Date 06/09/15   To:  Urgent Care  DAY 7:  Date 06/13/15    To:  Urgent Care  DAY 14:  Date 06/20/15  To:  Urgent Care    The [facility] is open from 8am-8pm Monday thru Friday and 9am-7pm on Saturdays and Sundays.  There will be a minimal fee for the injection that will be billed to your  insurance company along with the charge for the vaccine.  Date: 06/06/2015  Patient Signature: _________________________________________________  [facility] Copy   Patient Copy      Pharmacy Copy

## 2015-06-09 ENCOUNTER — Emergency Department (HOSPITAL_COMMUNITY)
Admission: EM | Admit: 2015-06-09 | Discharge: 2015-06-09 | Disposition: A | Discharge status: Home / Self Care | Payer: BLUE CROSS/BLUE SHIELD | Source: Home / Self Care

## 2015-06-09 ENCOUNTER — Encounter (HOSPITAL_COMMUNITY): Payer: Self-pay

## 2015-06-09 DIAGNOSIS — Z203 Contact with and (suspected) exposure to rabies: Secondary | ICD-10-CM | POA: Diagnosis not present

## 2015-06-09 MED ORDER — RABIES VACCINE, PCEC IM SUSR
1.0000 mL | Freq: Once | INTRAMUSCULAR | Status: AC
  Administered 2015-06-09: 1 mL via INTRAMUSCULAR
  Filled 2015-06-09: qty 1

## 2015-06-09 NOTE — ED Notes (Signed)
Day #3 of rabies series initiated in ED . Denies pain. No redness on inspection

## 2015-06-13 ENCOUNTER — Emergency Department (HOSPITAL_COMMUNITY)
Admission: EM | Admit: 2015-06-13 | Discharge: 2015-06-13 | Disposition: A | Discharge status: Home / Self Care | Payer: BLUE CROSS/BLUE SHIELD | Source: Home / Self Care

## 2015-06-13 ENCOUNTER — Encounter (HOSPITAL_COMMUNITY): Payer: Self-pay | Admitting: Emergency Medicine

## 2015-06-13 DIAGNOSIS — Z203 Contact with and (suspected) exposure to rabies: Secondary | ICD-10-CM | POA: Diagnosis not present

## 2015-06-13 MED ORDER — RABIES VACCINE, PCEC IM SUSR
1.0000 mL | Freq: Once | INTRAMUSCULAR | Status: AC
  Administered 2015-06-13: 1 mL via INTRAMUSCULAR

## 2015-06-13 NOTE — Discharge Instructions (Signed)
Please return on 7/25 for day 14 (#4) rabies series.  Thank you.

## 2015-06-13 NOTE — ED Notes (Signed)
Pt is here for day 7 rabies inj (#3) Voices no new concerns.... No acute distress.

## 2015-06-15 ENCOUNTER — Encounter (HOSPITAL_COMMUNITY): Payer: Self-pay | Admitting: Emergency Medicine

## 2015-06-15 ENCOUNTER — Emergency Department (HOSPITAL_COMMUNITY)
Admission: EM | Admit: 2015-06-15 | Discharge: 2015-06-15 | Disposition: A | Discharge status: Home / Self Care | Payer: BLUE CROSS/BLUE SHIELD | Source: Home / Self Care | Attending: Emergency Medicine | Admitting: Emergency Medicine

## 2015-06-15 DIAGNOSIS — Z5189 Encounter for other specified aftercare: Secondary | ICD-10-CM | POA: Diagnosis not present

## 2015-06-15 MED ORDER — MUPIROCIN 2 % EX OINT: 1.0000 "application " | TOPICAL_OINTMENT | Freq: Two times a day (BID) | CUTANEOUS | Status: DC

## 2015-06-15 NOTE — Discharge Instructions (Signed)
The wound looks like it is healing well. It does look like there may have been a small pocket of infection, but this has drained on its own. Apply mupirocin ointment twice a day for the next week. Apply warm compresses 2-3 times a day for the next 2 days. If you see spreading redness, have increased pain, or see more pus, please come back. Otherwise, come back on Monday for your last rabies vaccination.

## 2015-06-15 NOTE — ED Provider Notes (Signed)
CSN: [id number]     Arrival date & time 06/15/15  1833 History   First MD Initiated Contact with Patient 06/15/15 1858     Chief Complaint  Patient presents with  . Wound Infection   (Consider location/radiation/quality/duration/timing/severity/associated sxs/prior Treatment) HPI  She is a 59 year old woman here for evaluation of wound. She was bitten on her right leg by a fox a little over a week ago.  She was seen in the emergency room at that time and started on Augmentin as well as the rabies series. She finished the Augmentin 2 days ago. Today, she had a small amount of pus from the wound. She denies any increased pain or redness. No fevers or chills.  Past Medical History  Diagnosis Date  . Hypertension   . Hyperlipidemia   . Carpal tunnel syndrome     RIGHT HAND  . Anxiety   . Depression   . Arthritis     shoulder  . GERD (gastroesophageal reflux disease)   . Hyperthyroidism    Past Surgical History  Procedure Laterality Date  . Hemithyroidectomy    . Rupture belly button      WHEN SHE WAS AN INFANT  . Dermoid cyst  excision      OF HER RIGHT OVARY. SIZE OF A GRAPEFRUIT. OVARY IS STILL THERE.  . Tonsilectomy, adenoidectomy, bilateral myringotomy and tubes      AGE 25, pt states she didn't have tubes put into her ears   Family History  Problem Relation Age of Onset  . Ovarian cancer Mother   . Diabetes Mother   . Colon cancer Maternal Uncle   . Ovarian cancer Maternal Grandmother   . Esophageal cancer Neg Hx   . Rectal cancer Neg Hx   . Stomach cancer Neg Hx    History  Substance Use Topics  . Smoking status: Never Smoker   . Smokeless tobacco: Never Used  . Alcohol Use: No   OB History    No data available     Review of Systems As in history of present illness Allergies  Sulfa antibiotics  Home Medications   Prior to Admission medications   Medication Sig Start Date End Date Taking? Authorizing Provider  levothyroxine (SYNTHROID, LEVOTHROID) 100  MCG tablet Take 100 mcg by mouth daily.      Historical Provider, MD  mupirocin ointment (BACTROBAN) 2 % Apply 1 application topically 2 (two) times daily. For 1 week 06/15/15   Melony Overly, MD  naproxen (NAPROSYN) 500 MG tablet Take 1 tablet (500 mg total) by mouth 2 (two) times daily. 06/06/15   Tatyana Kirichenko, PA-C  ondansetron (ZOFRAN ODT) 8 MG disintegrating tablet Take 1 tablet (8 mg total) by mouth every 8 (eight) hours as needed for nausea or vomiting. 06/06/15   Tatyana Kirichenko, PA-C  rosuvastatin (CRESTOR) 40 MG tablet Take 40 mg by mouth daily. TAKE 1/4     Historical Provider, MD  topiramate (TOPAMAX) 25 MG tablet  09/09/12   Historical Provider, MD  valsartan (DIOVAN) 320 MG tablet Take 320 mg by mouth daily. 1/2 TABLET     Historical Provider, MD   BP 167/97 mmHg  Pulse 79  Temp(Src) 98.9 F (37.2 C) (Oral)  Resp 16  SpO2 99% Physical Exam  Constitutional: She is oriented to person, place, and time. She appears well-developed and well-nourished. No distress.  Cardiovascular: Normal rate.   Pulmonary/Chest: Effort normal.  Neurological: She is alert and oriented to person, place, and time.  Skin:  3 cm healing superficial laceration. The lateral aspect has a site that appears to have drained. There is no fluctuance or induration. No erythema or tenderness.    ED Course  Procedures (including critical care time) Labs Review Labs Reviewed - No data to display  Imaging Review No results found.   MDM   1. Visit for wound check    No active signs of infection. It sounds like there may have been a small abscess that has drained spontaneously. We'll treat with mupirocin ointment twice a day for the next week. Return precautions reviewed. She will follow-up on Monday for her last rabies shot.    Melony Overly, MD 06/15/15 [place]

## 2015-06-15 NOTE — ED Notes (Signed)
C/o wound infection on right leg She got bite by a dog Area is swollen and red

## 2015-06-20 ENCOUNTER — Encounter (HOSPITAL_COMMUNITY): Payer: Self-pay | Admitting: Emergency Medicine

## 2015-06-20 ENCOUNTER — Emergency Department (HOSPITAL_COMMUNITY)
Admission: EM | Admit: 2015-06-20 | Discharge: 2015-06-20 | Disposition: A | Discharge status: Home / Self Care | Payer: BLUE CROSS/BLUE SHIELD | Source: Home / Self Care | Attending: Family Medicine | Admitting: Family Medicine

## 2015-06-20 DIAGNOSIS — Z203 Contact with and (suspected) exposure to rabies: Secondary | ICD-10-CM

## 2015-06-20 DIAGNOSIS — Z23 Encounter for immunization: Secondary | ICD-10-CM | POA: Diagnosis not present

## 2015-06-20 MED ORDER — RABIES VACCINE, PCEC IM SUSR
1.0000 mL | Freq: Once | INTRAMUSCULAR | Status: AC
  Administered 2015-06-20: 1 mL via INTRAMUSCULAR

## 2015-06-20 MED ORDER — RABIES VACCINE, PCEC IM SUSR
INTRAMUSCULAR | Status: AC
  Filled 2015-06-20: qty 1

## 2015-06-20 NOTE — ED Notes (Signed)
Pt here for final rabies vaccine  

## 2015-07-02 ENCOUNTER — Ambulatory Visit (INDEPENDENT_AMBULATORY_CARE_PROVIDER_SITE_OTHER): Payer: BLUE CROSS/BLUE SHIELD | Admitting: Family Medicine

## 2015-07-02 VITALS — BP 136/88 | HR 78 | Temp 99.2°F | Resp 20 | Ht 64.5 in | Wt 237.0 lb

## 2015-07-02 DIAGNOSIS — R010 Benign and innocent cardiac murmurs: Secondary | ICD-10-CM | POA: Diagnosis not present

## 2015-07-02 DIAGNOSIS — R011 Cardiac murmur, unspecified: Secondary | ICD-10-CM

## 2015-07-02 DIAGNOSIS — T887XXA Unspecified adverse effect of drug or medicament, initial encounter: Secondary | ICD-10-CM

## 2015-07-02 DIAGNOSIS — I10 Essential (primary) hypertension: Secondary | ICD-10-CM | POA: Diagnosis not present

## 2015-07-02 DIAGNOSIS — T50905A Adverse effect of unspecified drugs, medicaments and biological substances, initial encounter: Secondary | ICD-10-CM

## 2015-07-02 NOTE — Progress Notes (Addendum)
Subjective:  This chart was scribed for Carolyn Cheadle, MD by Leandra Kern, Medical Scribe. This patient was seen in Room 14 and the patient's care was started at 3:49 PM.   Patient ID: Carolyn Mcguire, female    DOB: [birth year], 59 y.o.   MRN: [id number]  Chief Complaint  Patient presents with  . Medication Problem    Wants to stop taking bupropion XL 150mg  due to side effects    HPI HPI Comments: RAEANN Mcguire is a 59 y.o. female who [facility] consult.  Pt notes that she started Wellbutrin on 06/09 - 2 mos ago - and increased to 300 mg for 3 days. She reports that she had to cut the dosages in half,150 mg, due to the symptoms. Pt indicates associated side effects of headaches, dry mouth, dizziness, tinnitus more severe that usual, palpitations, and sleep disturbance. Pt notes that she tried to get in touch with her physician, Dr. Sherin Quarry to consult him about stopping taking the medication, however no success due to him being on a vacation. She reports that she has previously been on depression medications for which she experienced side effects with. Pt denies any urinary problems, or taking any OTC medications. Pt notes that she was put on the medication due to losing 3 immediate family members in the past few months (Father, mother, and brother).  Pt saw Dr. Sherin Quarry in November 2013. No murmur noted at that time.  Pt declined getting a blood work up done.  Pt has a surgical history of hemithyroidectomy.   PCP: Horatio Pel, MD   Patient Active Problem List   Diagnosis Date Noted  . HTN (hypertension) 04/23/2012  . LBBB (left bundle branch block) 06/15/2011   Past Medical History  Diagnosis Date  . Hypertension   . Hyperlipidemia   . Carpal tunnel syndrome     RIGHT HAND  . Anxiety   . Depression   . Arthritis     shoulder  . GERD (gastroesophageal reflux disease)   . Hyperthyroidism    Past Surgical History  Procedure  Laterality Date  . Hemithyroidectomy    . Rupture belly button      WHEN SHE WAS AN INFANT  . Dermoid cyst  excision      OF HER RIGHT OVARY. SIZE OF A GRAPEFRUIT. OVARY IS STILL THERE.  . Tonsilectomy, adenoidectomy, bilateral myringotomy and tubes      AGE 28, pt states she didn't have tubes put into her ears   Allergies  Allergen Reactions  . Sulfur     MAKES PT RED   Prior to Admission medications   Medication Sig Start Date End Date Taking? Authorizing Provider  buPROPion (WELLBUTRIN XL) 150 MG 24 hr tablet Take 150 mg by mouth daily.   Yes Historical Provider, MD  levothyroxine (SYNTHROID, LEVOTHROID) 88 MCG tablet Take 88 mcg by mouth daily before breakfast.   Yes Historical Provider, MD  ondansetron (ZOFRAN ODT) 8 MG disintegrating tablet Take 1 tablet (8 mg total) by mouth every 8 (eight) hours as needed for nausea or vomiting. 06/06/15  Yes Tatyana Kirichenko, PA-C  rosuvastatin (CRESTOR) 10 MG tablet Take 10 mg by mouth daily.   Yes Historical Provider, MD  valsartan (DIOVAN) 320 MG tablet Take 320 mg by mouth daily. 1/2 TABLET    Yes Historical Provider, MD  levothyroxine (SYNTHROID, LEVOTHROID) 100 MCG tablet Take 100 mcg by mouth daily.  Historical Provider, MD  mupirocin ointment (BACTROBAN) 2 % Apply 1 application topically 2 (two) times daily. For 1 week Patient not taking: Reported on 07/02/2015 06/15/15   Melony Overly, MD  naproxen (NAPROSYN) 500 MG tablet Take 1 tablet (500 mg total) by mouth 2 (two) times daily. Patient not taking: Reported on 07/02/2015 06/06/15   Tatyana Kirichenko, PA-C  rosuvastatin (CRESTOR) 40 MG tablet Take 40 mg by mouth daily. TAKE 1/4     Historical Provider, MD  topiramate (TOPAMAX) 25 MG tablet  09/09/12   Historical Provider, MD   History   Social History  . Marital Status: Single    Spouse Name: N/A  . Number of Children: N/A  . Years of Education: N/A   Occupational History  . Unemployed    Social History Main Topics  . Smoking  status: Never Smoker   . Smokeless tobacco: Never Used  . Alcohol Use: No  . Drug Use: No  . Sexual Activity: Not on file   Other Topics Concern  . Not on file   Social History Narrative   Depression screen [facility] 2/9 07/02/2015  Decreased Interest 0  Down, Depressed, Hopeless 0  PHQ - 2 Score 0     Review of Systems  Constitutional: Negative for fever and chills.  HENT: Positive for tinnitus. Negative for ear pain.   Cardiovascular: Positive for palpitations.  Genitourinary: Negative for dysuria, urgency, frequency, vaginal discharge, difficulty urinating, genital sores and vaginal pain.  Neurological: Positive for dizziness, tremors and headaches. Negative for weakness.  Hematological: Negative for adenopathy.  Psychiatric/Behavioral: Positive for sleep disturbance. Negative for dysphoric mood. The patient is not nervous/anxious.       Objective:   Physical Exam  Constitutional: She is oriented to person, place, and time. She appears well-developed and well-nourished. No distress.  HENT:  Head: Normocephalic and atraumatic.  Mouth/Throat: Oropharynx is clear and moist.  Eyes: EOM are normal. Pupils are equal, round, and reactive to light.  Neck: Neck supple.  Cardiovascular: Normal rate and regular rhythm.   Murmur (2/6 systolic murmur, left upper external border. ) heard. Pulmonary/Chest: Effort normal and breath sounds normal. No respiratory distress. She has no wheezes. She has no rales.  Lymphadenopathy:    She has no cervical adenopathy.  Neurological: She is alert and oriented to person, place, and time. No cranial nerve deficit.  Skin: Skin is warm and dry.  Psychiatric: She has a normal mood and affect. Her behavior is normal.  Nursing note and vitals reviewed.  BP 136/88 mmHg  Pulse 78  Temp(Src) 99.2 F (37.3 C) (Oral)  Resp 20  Ht 5' 4.5" (1.638 m)  Wt 237 lb (107.502 kg)  BMI 40.07 kg/m2  SpO2 97%  Negative orthostatics.     Assessment & Plan:   1.  Essential hypertension   2. Undiagnosed cardiac murmurs   3. Medication side effect, initial encounter   Pt reassured that it is fine to just stop wellbutrin and f/u w/ her PCP for other med if needed and have murmur rechecked.  If she develops an sxs while on wellbutrin ok to call and we can decrease her to 150 qd for a short time before stopping.  Meds ordered this encounter  Medications  . levothyroxine (SYNTHROID, LEVOTHROID) 88 MCG tablet    Sig: Take 88 mcg by mouth daily before breakfast.  . rosuvastatin (CRESTOR) 10 MG tablet    Sig: Take 10 mg by mouth daily.  Marland Kitchen buPROPion (WELLBUTRIN XL)  150 MG 24 hr tablet    Sig: Take 150 mg by mouth daily.    I personally performed the services described in this documentation, which was scribed in my presence. The recorded information has been reviewed and considered, and addended by me as needed.  Carolyn Cheadle, MD MPH

## 2015-07-02 NOTE — Patient Instructions (Signed)
Bupropion extended-release tablets (Depression/Mood Disorders) What is this medicine? BUPROPION (byoo PROE pee on) is used to treat depression. This medicine may be used for other purposes; ask your health care provider or pharmacist if you have questions. COMMON BRAND NAME(S): Aplenzin, Budeprion XL, Forfivo XL, Wellbutrin XL What should I tell my health care provider before I take this medicine? They need to know if you have any of these conditions: -an eating disorder, such as anorexia or bulimia -bipolar disorder or psychosis -diabetes or high blood sugar, treated with medication -glaucoma -head injury or brain tumor -heart disease, previous heart attack, or irregular heart beat -high blood pressure -kidney or liver disease -seizures (convulsions) -suicidal thoughts or a previous suicide attempt -Tourette's syndrome -weight loss -an unusual or allergic reaction to bupropion, other medicines, foods, dyes, or preservatives -breast-feeding -pregnant or trying to become pregnant How should I use this medicine? Take this medicine by mouth with a glass of water. Follow the directions on the prescription label. You can take it with or without food. If it upsets your stomach, take it with food. Do not crush, chew, or cut these tablets. This medicine is taken once daily at the same time each day. Do not take your medicine more often than directed. Do not stop taking this medicine suddenly except upon the advice of your doctor. Stopping this medicine too quickly may cause serious side effects or your condition may worsen. A special MedGuide will be given to you by the pharmacist with each prescription and refill. Be sure to read this information carefully each time. Talk to your pediatrician regarding the use of this medicine in children. Special care may be needed. Overdosage: If you think you have taken too much of this medicine contact a poison control center or emergency room at once. NOTE:  This medicine is only for you. Do not share this medicine with others. What if I miss a dose? If you miss a dose, skip the missed dose and take your next tablet at the regular time. Do not take double or extra doses. What may interact with this medicine? Do not take this medicine with any of the following medications: -linezolid -MAOIs like Azilect, Carbex, Eldepryl, Marplan, Nardil, and Parnate -methylene blue (injected into a vein) -other medicines that contain bupropion like Zyban This medicine may also interact with the following medications: -alcohol -certain medicines for anxiety or sleep -certain medicines for blood pressure like metoprolol, propranolol -certain medicines for depression or psychotic disturbances -certain medicines for HIV or AIDS like efavirenz, lopinavir, nelfinavir, ritonavir -certain medicines for irregular heart beat like propafenone, flecainide -certain medicines for Parkinson's disease like amantadine, levodopa -certain medicines for seizures like carbamazepine, phenytoin, phenobarbital -cimetidine -clopidogrel -cyclophosphamide -furazolidone -isoniazid -nicotine -orphenadrine -procarbazine -steroid medicines like prednisone or cortisone -stimulant medicines for attention disorders, weight loss, or to stay awake -tamoxifen -theophylline -thiotepa -ticlopidine -tramadol -warfarin This list may not describe all possible interactions. Give your health care provider a list of all the medicines, herbs, non-prescription drugs, or dietary supplements you use. Also tell them if you smoke, drink alcohol, or use illegal drugs. Some items may interact with your medicine. What should I watch for while using this medicine? Tell your doctor if your symptoms do not get better or if they get worse. Visit your doctor or health care professional for regular checks on your progress. Because it may take several weeks to see the full effects of this medicine, it is  important to continue your treatment as prescribed   by your doctor. Patients and their families should watch out for new or worsening thoughts of suicide or depression. Also watch out for sudden changes in feelings such as feeling anxious, agitated, panicky, irritable, hostile, aggressive, impulsive, severely restless, overly excited and hyperactive, or not being able to sleep. If this happens, especially at the beginning of treatment or after a change in dose, call your health care professional. Avoid alcoholic drinks while taking this medicine. Drinking large amounts of alcoholic beverages, using sleeping or anxiety medicines, or quickly stopping the use of these agents while taking this medicine may increase your risk for a seizure. Do not drive or use heavy machinery until you know how this medicine affects you. This medicine can impair your ability to perform these tasks. Do not take this medicine close to bedtime. It may prevent you from sleeping. Your mouth may get dry. Chewing sugarless gum or sucking hard candy, and drinking plenty of water may help. Contact your doctor if the problem does not go away or is severe. The tablet shell for some brands of this medicine does not dissolve. This is normal. The tablet shell may appear whole in the stool. This is not a cause for concern. What side effects may I notice from receiving this medicine? Side effects that you should report to your doctor or health care professional as soon as possible: -allergic reactions like skin rash, itching or hives, swelling of the face, lips, or tongue -breathing problems -changes in vision -confusion -fast or irregular heartbeat -hallucinations -increased blood pressure -redness, blistering, peeling or loosening of the skin, including inside the mouth -seizures -suicidal thoughts or other mood changes -unusually weak or tired -vomiting Side effects that usually do not require medical attention (report to your  doctor or health care professional if they continue or are bothersome): -change in sex drive or performance -constipation -headache -loss of appetite -nausea -tremors -weight loss This list may not describe all possible side effects. Call your doctor for medical advice about side effects. You may report side effects to FDA at 1-800-FDA-1088. Where should I keep my medicine? Keep out of the reach of children. Store at room temperature between 15 and 30 degrees C (59 and 86 degrees F). Throw away any unused medicine after the expiration date. NOTE: This sheet is a summary. It may not cover all possible information. If you have questions about this medicine, talk to your doctor, pharmacist, or health care provider.  2015, Elsevier/Gold Standard. (2013-06-05 12:39:42)  

## 2015-07-06 ENCOUNTER — Telehealth: Payer: Self-pay

## 2015-07-06 NOTE — Telephone Encounter (Signed)
Pt needs to talk with someone about the side effects to the rabies shot and the nausea medication that she is using   Best number [phone]

## 2015-07-07 ENCOUNTER — Emergency Department (HOSPITAL_COMMUNITY)
Admission: EM | Admit: 2015-07-07 | Discharge: 2015-07-07 | Disposition: A | Discharge status: Home / Self Care | Payer: BLUE CROSS/BLUE SHIELD | Attending: Emergency Medicine | Admitting: Emergency Medicine

## 2015-07-07 ENCOUNTER — Encounter (HOSPITAL_COMMUNITY): Payer: Self-pay | Admitting: Emergency Medicine

## 2015-07-07 ENCOUNTER — Emergency Department (HOSPITAL_COMMUNITY): Payer: BLUE CROSS/BLUE SHIELD

## 2015-07-07 DIAGNOSIS — I447 Left bundle-branch block, unspecified: Secondary | ICD-10-CM | POA: Diagnosis not present

## 2015-07-07 DIAGNOSIS — F329 Major depressive disorder, single episode, unspecified: Secondary | ICD-10-CM | POA: Insufficient documentation

## 2015-07-07 DIAGNOSIS — R002 Palpitations: Secondary | ICD-10-CM | POA: Insufficient documentation

## 2015-07-07 DIAGNOSIS — F419 Anxiety disorder, unspecified: Secondary | ICD-10-CM | POA: Diagnosis not present

## 2015-07-07 DIAGNOSIS — R0602 Shortness of breath: Secondary | ICD-10-CM | POA: Insufficient documentation

## 2015-07-07 DIAGNOSIS — K219 Gastro-esophageal reflux disease without esophagitis: Secondary | ICD-10-CM | POA: Insufficient documentation

## 2015-07-07 DIAGNOSIS — R110 Nausea: Secondary | ICD-10-CM | POA: Insufficient documentation

## 2015-07-07 DIAGNOSIS — Z79899 Other long term (current) drug therapy: Secondary | ICD-10-CM | POA: Diagnosis not present

## 2015-07-07 DIAGNOSIS — I10 Essential (primary) hypertension: Secondary | ICD-10-CM | POA: Diagnosis not present

## 2015-07-07 DIAGNOSIS — E0590 Thyrotoxicosis, unspecified without thyrotoxic crisis or storm: Secondary | ICD-10-CM | POA: Diagnosis not present

## 2015-07-07 DIAGNOSIS — M1990 Unspecified osteoarthritis, unspecified site: Secondary | ICD-10-CM | POA: Diagnosis not present

## 2015-07-07 DIAGNOSIS — E785 Hyperlipidemia, unspecified: Secondary | ICD-10-CM | POA: Diagnosis not present

## 2015-07-07 LAB — CBC WITH DIFFERENTIAL/PLATELET
Basophils Absolute: 0 10*3/uL (ref 0.0–0.1)
Basophils Relative: 0 % (ref 0–1)
Eosinophils Absolute: 0 10*3/uL (ref 0.0–0.7)
Eosinophils Relative: 1 % (ref 0–5)
HCT: 43.4 % (ref 36.0–46.0)
Hemoglobin: 14.9 g/dL (ref 12.0–15.0)
Lymphocytes Relative: 20 % (ref 12–46)
Lymphs Abs: 1.6 10*3/uL (ref 0.7–4.0)
MCH: 29.2 pg (ref 26.0–34.0)
MCHC: 34.3 g/dL (ref 30.0–36.0)
MCV: 84.9 fL (ref 78.0–100.0)
Monocytes Absolute: 0.3 10*3/uL (ref 0.1–1.0)
Monocytes Relative: 4 % (ref 3–12)
Neutro Abs: 6 10*3/uL (ref 1.7–7.7)
Neutrophils Relative %: 75 % (ref 43–77)
Platelets: 214 10*3/uL (ref 150–400)
RBC: 5.11 MIL/uL (ref 3.87–5.11)
RDW: 12.6 % (ref 11.5–15.5)
WBC: 8 10*3/uL (ref 4.0–10.5)

## 2015-07-07 LAB — BASIC METABOLIC PANEL
ANION GAP: 15 (ref 5–15)
BUN: 8 mg/dL (ref 6–20)
CHLORIDE: 100 mmol/L — AB (ref 101–111)
CO2: 25 mmol/L (ref 22–32)
Calcium: 9.5 mg/dL (ref 8.9–10.3)
Creatinine, Ser: 0.88 mg/dL (ref 0.44–1.00)
GFR calc Af Amer: >60 mL/min (ref >60)
GFR calc non Af Amer: >60 mL/min (ref >60)
Glucose, Bld: 108 mg/dL — ABNORMAL HIGH (ref 65–99)
POTASSIUM: 3.6 mmol/L (ref 3.5–5.1)
Sodium: 140 mmol/L (ref 135–145)

## 2015-07-07 MED ORDER — ONDANSETRON 4 MG PO TBDP: 4.0000 mg | ORAL_TABLET | Freq: Three times a day (TID) | ORAL | Status: DC | PRN

## 2015-07-07 NOTE — ED Provider Notes (Signed)
CSN: [id number]     Arrival date & time 07/07/15  1525 History   First MD Initiated Contact with Patient 07/07/15 1657     Chief Complaint  Patient presents with  . Palpitations  . Shortness of Breath  . Nausea     (Consider location/radiation/quality/duration/timing/severity/associated sxs/prior Treatment) Patient is a 59 y.o. female presenting with shortness of breath. The history is provided by the patient.  Shortness of Breath Severity:  Mild Onset quality:  Gradual Duration:  1 week Timing:  Constant Progression:  Unchanged Chronicity:  New Context: activity   Relieved by:  Nothing Worsened by:  Nothing tried Ineffective treatments:  None tried Associated symptoms: headaches   Associated symptoms: no abdominal pain, no chest pain, no fever, no vomiting and no wheezing    59 yo F with a chief complaint of shortness of breath mild fatigue headache nausea. This started about a week ago. Patient was recently given the rabies series due to [facility] bite. Patient has also been taking Wellbutrin and recently stopped this. Since then patient has had these symptoms. Mildly worse with moving around. Patient also had the death of 4 family members as well as her cat over the past 2 years. Has been significantly depressed. Denies suicidal or homicidal ideation.  Past Medical History  Diagnosis Date  . Hypertension   . Hyperlipidemia   . Carpal tunnel syndrome     RIGHT HAND  . Anxiety   . Depression   . Arthritis     shoulder  . GERD (gastroesophageal reflux disease)   . Hyperthyroidism    Past Surgical History  Procedure Laterality Date  . Hemithyroidectomy    . Rupture belly button      WHEN SHE WAS AN INFANT  . Dermoid cyst  excision      OF HER RIGHT OVARY. SIZE OF A GRAPEFRUIT. OVARY IS STILL THERE.  . Tonsilectomy, adenoidectomy, bilateral myringotomy and tubes      AGE 22, pt states she didn't have tubes put into her ears   Family History  Problem Relation Age of Onset   . Ovarian cancer Mother   . Diabetes Mother   . Colon cancer Maternal Uncle   . Ovarian cancer Maternal Grandmother   . Esophageal cancer Neg Hx   . Rectal cancer Neg Hx   . Stomach cancer Neg Hx   . Heart disease Father    Social History  Substance Use Topics  . Smoking status: Never Smoker   . Smokeless tobacco: Never Used  . Alcohol Use: No   OB History    No data available     Review of Systems  Constitutional: Negative for fever and chills.  HENT: Negative for congestion and rhinorrhea.   Eyes: Negative for redness and visual disturbance.  Respiratory: Negative for shortness of breath and wheezing.   Cardiovascular: Negative for chest pain and palpitations.  Gastrointestinal: Positive for nausea and constipation (one hard stool). Negative for vomiting and abdominal pain.  Genitourinary: Negative for dysuria and urgency.  Musculoskeletal: Negative for myalgias and arthralgias.  Skin: Negative for pallor and wound.  Neurological: Positive for headaches. Negative for dizziness.      Allergies  Sulfur  Home Medications   Prior to Admission medications   Medication Sig Start Date End Date Taking? Authorizing Provider  buPROPion (WELLBUTRIN XL) 150 MG 24 hr tablet Take 150 mg by mouth daily.    Historical Provider, MD  levothyroxine (SYNTHROID, LEVOTHROID) 100 MCG tablet Take 100 mcg by  mouth daily.      Historical Provider, MD  levothyroxine (SYNTHROID, LEVOTHROID) 88 MCG tablet Take 88 mcg by mouth daily before breakfast.    Historical Provider, MD  mupirocin ointment (BACTROBAN) 2 % Apply 1 application topically 2 (two) times daily. For 1 week Patient not taking: Reported on 07/02/2015 06/15/15   Melony Overly, MD  naproxen (NAPROSYN) 500 MG tablet Take 1 tablet (500 mg total) by mouth 2 (two) times daily. Patient not taking: Reported on 07/02/2015 06/06/15   Tatyana Kirichenko, PA-C  ondansetron (ZOFRAN ODT) 4 MG disintegrating tablet Take 1 tablet (4 mg total) by mouth  every 8 (eight) hours as needed for nausea or vomiting. 07/07/15   Deno Etienne, DO  ondansetron (ZOFRAN ODT) 8 MG disintegrating tablet Take 1 tablet (8 mg total) by mouth every 8 (eight) hours as needed for nausea or vomiting. 06/06/15   Tatyana Kirichenko, PA-C  rosuvastatin (CRESTOR) 10 MG tablet Take 10 mg by mouth daily.    Historical Provider, MD  rosuvastatin (CRESTOR) 40 MG tablet Take 40 mg by mouth daily. TAKE 1/4     Historical Provider, MD  topiramate (TOPAMAX) 25 MG tablet  09/09/12   Historical Provider, MD  valsartan (DIOVAN) 320 MG tablet Take 320 mg by mouth daily. 1/2 TABLET     Historical Provider, MD   BP 170/63 mmHg  Pulse 66  Temp(Src) 98.7 F (37.1 C) (Oral)  Resp 15  SpO2 98% Physical Exam  Constitutional: She is oriented to person, place, and time. She appears well-developed and well-nourished. No distress.  HENT:  Head: Normocephalic and atraumatic.  Eyes: EOM are normal. Pupils are equal, round, and reactive to light.  Neck: Normal range of motion. Neck supple.  Cardiovascular: Normal rate and regular rhythm.  Exam reveals no gallop and no friction rub.   No murmur heard. Pulmonary/Chest: Effort normal. She has no wheezes. She has no rales.  Abdominal: Soft. She exhibits no distension. There is no tenderness. There is no rebound.  Musculoskeletal: She exhibits no edema or tenderness.  Neurological: She is alert and oriented to person, place, and time.  Skin: Skin is warm and dry. She is not diaphoretic.  Psychiatric: Her behavior is normal. Her mood appears anxious.    ED Course  Procedures (including critical care time) Labs Review Labs Reviewed  BASIC METABOLIC PANEL - Abnormal; Notable for the following:    Chloride 100 (*)    Glucose, Bld 108 (*)    All other components within normal limits  CBC WITH DIFFERENTIAL/PLATELET    Imaging Review Dg Chest 2 View  07/07/2015   CLINICAL DATA:  Shortness of Breath  EXAM: CHEST  2 VIEW  COMPARISON:   07/02/2012  FINDINGS: Cardiomediastinal silhouette is stable. There is mild elevation of the right hemidiaphragm again noted. No acute infiltrate or pleural effusion. No pulmonary edema.  IMPRESSION: No active cardiopulmonary disease.   Electronically Signed   By: Lahoma Crocker M.D.   On: 07/07/2015 16:47   I, Cecilio Asper, personally reviewed and evaluated these images and lab results as part of my medical decision-making.   EKG Interpretation   Date/Time:  Thursday July 07 2015 15:31:37 EDT Ventricular Rate:  83 PR Interval:  144 QRS Duration: 136 QT Interval:  386 QTC Calculation: 453 R Axis:   -41 Text Interpretation:  Unusual P axis, possible ectopic atrial rhythm Left  axis deviation Non-specific intra-ventricular conduction block Abnormal  ECG no prior ecg for comparison Confirmed by Sherika Kubicki  MD, Quillian Quince [phone]) on  07/07/2015 5:20:17 PM      MDM   Final diagnoses:  Shortness of breath  LBBB (left bundle branch block)    59 yo F with a chief complaint of shortness of breath. Chest x-ray and EKG unremarkable. No noted chest pain. No noted abdominal pain. Benign abdominal exam. Patient complaining of some nausea but able to tolerate by mouth in the ED. Well appearing. EKG with a left bundle branch block. Patient states this is an old finding. CBC and BMP unremarkable. Without other abdominal findings feel that a liver function panel or lipase to be unnecessary. Given prescription for Zofran. We'll have follow-up with her PCP.  5:44 PM:  I have discussed the diagnosis/risks/treatment options with the patient and family and believe the pt to be eligible for discharge home to follow-up with PCP. We also discussed returning to the ED immediately if new or worsening sx occur. We discussed the sx which are most concerning (e.g., fever, abdominal pain chest pain) that necessitate immediate return. Medications administered to the patient during their visit and any new prescriptions  provided to the patient are listed below.  Medications given during this visit Medications - No data to display  New Prescriptions   ONDANSETRON (ZOFRAN ODT) 4 MG DISINTEGRATING TABLET    Take 1 tablet (4 mg total) by mouth every 8 (eight) hours as needed for nausea or vomiting.     The patient appears reasonably screen and/or stabilized for discharge and I doubt any other medical condition or other [facility] requiring further screening, evaluation, or treatment in the ED at this time prior to discharge.      Deno Etienne, DO 07/07/15 1744

## 2015-07-07 NOTE — Discharge Instructions (Signed)
Shortness of Breath °Shortness of breath means you have trouble breathing. Shortness of breath needs medical care right away. °HOME CARE  °· Do not smoke. °· Avoid being around chemicals or things (paint fumes, dust) that may bother your breathing. °· Rest as needed. Slowly begin your normal activities. °· Only take medicines as told by your doctor. °· Keep all doctor visits as told. °GET HELP RIGHT AWAY IF:  °· Your shortness of breath gets worse. °· You feel lightheaded, pass out (faint), or have a cough that is not helped by medicine. °· You cough up blood. °· You have pain with breathing. °· You have pain in your chest, arms, shoulders, or belly (abdomen). °· You have a fever. °· You cannot walk up stairs or exercise the way you normally do. °· You do not get better in the time expected. °· You have a hard time doing normal activities even with rest. °· You have problems with your medicines. °· You have any new symptoms. °MAKE SURE YOU: °· Understand these instructions. °· Will watch your condition. °· Will get help right away if you are not doing well or get worse. °Document Released: 04/30/2008 Document Revised: 11/17/2013 Document Reviewed: 01/28/2012 °ExitCare® Patient Information ©2015 [employer]. This information is not intended to replace advice given to you by your health care provider. Make sure you discuss any questions you have with your health care provider. ° °

## 2015-07-07 NOTE — ED Notes (Signed)
MD at bedside. 

## 2015-07-07 NOTE — Telephone Encounter (Signed)
Spoke with pt, she is going to see her PCP today.

## 2015-07-07 NOTE — ED Notes (Signed)
Pt sts palpitations, nausea and SOB x 5 days worse today

## 2015-08-15 ENCOUNTER — Ambulatory Visit: Payer: BLUE CROSS/BLUE SHIELD | Admitting: Cardiovascular Disease

## 2015-08-31 ENCOUNTER — Ambulatory Visit (INDEPENDENT_AMBULATORY_CARE_PROVIDER_SITE_OTHER): Payer: BLUE CROSS/BLUE SHIELD | Admitting: Cardiovascular Disease

## 2015-08-31 ENCOUNTER — Encounter: Payer: Self-pay | Admitting: Cardiovascular Disease

## 2015-08-31 VITALS — BP 148/90 | HR 64 | Ht 64.0 in | Wt 230.0 lb

## 2015-08-31 DIAGNOSIS — R0600 Dyspnea, unspecified: Secondary | ICD-10-CM | POA: Diagnosis not present

## 2015-08-31 DIAGNOSIS — I10 Essential (primary) hypertension: Secondary | ICD-10-CM

## 2015-08-31 DIAGNOSIS — I447 Left bundle-branch block, unspecified: Secondary | ICD-10-CM

## 2015-08-31 NOTE — Patient Instructions (Addendum)
Medication Instructions:  Your physician recommends that you continue on your current medications as directed. Please refer to the Current Medication list given to you today.   Labwork: None Ordered   Testing/Procedures: Your physician has requested that you have an echocardiogram. Echocardiography is a painless test that uses sound waves to create images of your heart. It provides your doctor with information about the size and shape of your heart and how well your heart's chambers and valves are working. This procedure takes approximately one hour. There are no restrictions for this procedure.   Follow-Up: Your physician wants you to follow-up in: 6 months with Dr. Acie Fredrickson.  You will receive a reminder letter in the mail two months in advance. If you don't receive a letter, please call our office to schedule the follow-up appointment.   Your physician discussed the importance of regular exercise and recommended that you start or continue a regular exercise program for good health.

## 2015-08-31 NOTE — Progress Notes (Signed)
Carolyn Mcguire Date of Birth  07-11-1956 [facility] [address]    [place], [place]  [address] [phone]  Fax  [phone]  Problem list: 1. Left bundle branch block 2. Hypothyroidism 3. Hyperlipidemia 4. Hypertension 5. Dyspnea on exertion   History of Present Illness:  59 yo female with a hx of LBBB.  Stress myoview was negative.  She's been on a diet and exercise program and has lost about 6 pounds since last year.  She is drinking more water, avoiding fast foods.  She is walking regularly.  She has been diagnosed with migraines.  She thinks muscle tension may be contributing to her headaches.  Oct. 5, 2016:  Carolyn Mcguire is seen today for follow up of her shortness of breath Has had many deaths in her family and became depressed . Was started on Welbutrin for depression .  She was seen in the ER. Was told by the pharmacist at her primary medical doctors office thought it was due to the discontinuation of her Welbutrin. She is still not feeling 100% better so she was scheduled to see me .  She is still short of breath.    DOE  With walking up from the buildings in the back of her property.  She does not getting any regular exercise. She was bitten by a fox on July 11.  Had a series of rabies shots ( the fox was rabid)  Is retired .   She has had a stressful 2 years.    Several deaths in her family . Has not had the time or inclination   Wants to start a walking program.      No CP   Current Outpatient Prescriptions on File Prior to Visit  Medication Sig Dispense Refill  . levothyroxine (SYNTHROID, LEVOTHROID) 88 MCG tablet Take 88 mcg by mouth daily before breakfast.    . rosuvastatin (CRESTOR) 10 MG tablet Take 10 mg by mouth daily.    . valsartan (DIOVAN) 320 MG tablet Take 320 mg by mouth daily. 1/2 TABLET      No current facility-administered medications on file prior to visit.    Allergies  Allergen Reactions  . Sulfur Rash    MAKES PT  RED    Past Medical History  Diagnosis Date  . Hypertension   . Hyperlipidemia   . Carpal tunnel syndrome     RIGHT HAND  . Anxiety   . Depression   . Arthritis     shoulder  . GERD (gastroesophageal reflux disease)   . Hyperthyroidism     Past Surgical History  Procedure Laterality Date  . Hemithyroidectomy    . Rupture belly button      WHEN SHE WAS AN INFANT  . Dermoid cyst  excision      OF HER RIGHT OVARY. SIZE OF A GRAPEFRUIT. OVARY IS STILL THERE.  . Tonsilectomy, adenoidectomy, bilateral myringotomy and tubes      AGE 5, pt states she didn't have tubes put into her ears    History  Smoking status  . Never Smoker   Smokeless tobacco  . Never Used    History  Alcohol Use No    Family History  Problem Relation Age of Onset  . Ovarian cancer Mother   . Diabetes Mother   . Colon cancer Maternal Uncle   . Ovarian cancer Maternal Grandmother   . Esophageal cancer Neg Hx   . Rectal cancer Neg Hx   . Stomach cancer  Neg Hx   . Heart disease Father     Reviw of Systems:  Reviewed in the HPI.  All other systems are negative.  Physical Exam: BP 148/90 mmHg  Pulse 64  Ht 5\' 4"  (1.626 m)  Wt 104.327 kg (230 lb)  BMI 39.46 kg/m2  SpO2 96% The patient is alert and oriented x 3.  The mood and affect are normal.   Skin: warm and dry.  Color is normal.   HEENT:   Normal carotids, no JVD Lungs: clear  Heart: RR   Abdomen: normal BS, non tender Extremities:  No edema Neuro:  nonfocal    ECG:  Aug. 12, 2016:  NSR , LBBB   Assessment / Plan:   1. Left bundle branch block- will check echo to make sure her /LV EF is still normal.   I doubt that her DOE is due to LBBB.  I suspect her DOE is due to deconditioning   2. Hypothyroidism  3. Hyperlipidemia 4. Hypertension 5. Dyspnea on exertion-  I suspect that she is deconditioned. She's not been exercising. In addition, she is somewhat overweight. I've recommended that she start regular exercise and weight  loss program. Offered to send her to a nutritionist. We offered to set her up at the [facility] at the wellness program.  Will get an echo.  I'll see her in 6 months    Gail Vendetti, Wonda Cheng, MD  08/31/2015 10:32 AM    [facility] [place],  [place], [place]  [address] Pager [phone] Phone: [phone]; Fax: [phone]   [facility]  [address] [place], [place]  [address] [phone]   Fax [phone]

## 2015-09-08 ENCOUNTER — Other Ambulatory Visit: Payer: Self-pay

## 2015-09-08 ENCOUNTER — Ambulatory Visit (HOSPITAL_COMMUNITY): Payer: BLUE CROSS/BLUE SHIELD | Attending: Interventional Cardiology

## 2015-09-08 DIAGNOSIS — I071 Rheumatic tricuspid insufficiency: Secondary | ICD-10-CM | POA: Insufficient documentation

## 2015-09-08 DIAGNOSIS — I10 Essential (primary) hypertension: Secondary | ICD-10-CM | POA: Diagnosis not present

## 2015-09-08 DIAGNOSIS — R0600 Dyspnea, unspecified: Secondary | ICD-10-CM | POA: Diagnosis not present

## 2015-09-08 DIAGNOSIS — I447 Left bundle-branch block, unspecified: Secondary | ICD-10-CM | POA: Diagnosis not present

## 2015-09-08 DIAGNOSIS — I340 Nonrheumatic mitral (valve) insufficiency: Secondary | ICD-10-CM | POA: Insufficient documentation

## 2015-09-12 ENCOUNTER — Telehealth: Payer: Self-pay | Admitting: Cardiovascular Disease

## 2015-09-12 NOTE — Telephone Encounter (Signed)
Reviewed echo results with patient who verbalized understanding.    

## 2015-09-12 NOTE — Telephone Encounter (Signed)
New message   Pt calling for ECHO results  Please call pt

## 2015-09-15 ENCOUNTER — Telehealth: Payer: Self-pay | Admitting: Cardiovascular Disease

## 2015-09-15 ENCOUNTER — Encounter: Payer: Self-pay | Admitting: Physician Assistant

## 2015-09-15 DIAGNOSIS — E785 Hyperlipidemia, unspecified: Secondary | ICD-10-CM | POA: Insufficient documentation

## 2015-09-15 DIAGNOSIS — I5189 Other ill-defined heart diseases: Secondary | ICD-10-CM | POA: Insufficient documentation

## 2015-09-15 DIAGNOSIS — I10 Essential (primary) hypertension: Secondary | ICD-10-CM | POA: Insufficient documentation

## 2015-09-15 DIAGNOSIS — E669 Obesity, unspecified: Secondary | ICD-10-CM | POA: Insufficient documentation

## 2015-09-15 MED ORDER — AMLODIPINE BESYLATE 5 MG PO TABS: 5.0000 mg | ORAL_TABLET | Freq: Every day | ORAL | Status: DC

## 2015-09-15 NOTE — Telephone Encounter (Signed)
Pt called back and states that she had her BP checked at local drug store and it was 181/81. Spoke with Tarri Fuller, PA-C and he said to have pt start Amlodipine 5mg  QD and f/u with Melina Copa, PA-C tomorrow as planned. Spoke with pt and advised her of new order. Prescription sent to preferred local pharmacy. Pt verbalized understanding and was in agreement with this plan.

## 2015-09-15 NOTE — Addendum Note (Signed)
Addended by: Loren Racer on: 09/15/2015 02:23 PM   Modules accepted: Orders

## 2015-09-15 NOTE — Telephone Encounter (Signed)
Pt states that suddenly at 0930 she started feeling SOB, nausea, dizziness, and "feels her heart racing". Pt did not have vitals available for today but states that she went to her PCP on Tuesday and her BP was 161/90 and they increased her Diovan to a full tab at that time (320mg ). Pt states that symptoms come in waves, they will improve and then worsen but never fully resolve. Spoke with Tarri Fuller, PA-C and he asked that pt go to local drug store and have her BP checked and we could offer her an appt for tomorrow. Spoke with pt and advised her of recommendations per Tarri Fuller, PA-C. Pt verbalized understanding and was in agreement. Appt made for tomorrow with Melina Copa, PA-C at 0930 and pt states that she will try to go to local fire dept to have BP checked and if she cant then she will go to [facility] to check.

## 2015-09-15 NOTE — Progress Notes (Signed)
Cardiology Office Note Date:  09/16/2015  Patient ID:  Carolyn Mcguire July 20, 1956, MRN [id number] PCP:  Horatio Pel, MD  Cardiologist:  Dr. Acie Fredrickson  Chief Complaint: elevated BP, SOB  History of Present Illness: Carolyn Mcguire is a 59 y.o. female with history of HTN, HLD, hypothyroidism, LBBB (prior negative nuclear stress test 06/2011), migraines, depression, obesity who presents for evaluation of SOB, malaise, and dizziness. She saw Dr. Acie Fredrickson earlier this month at which time she was reporting dyspnea felt partly due to her deconditioning and diastolic dysfunction. 2D echo 09/08/15: EF 55-60%, grade 1 DD, elevated LVEDP, mild MR, mild TR. Weight loss was recommended. She has history of not feeling well intermittently dating back to August of this year. 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  She went to her PCP earlier this week at which time BP was 151/90 thus Diovan was increased. She did not notice significant improvement in her BP with this. She called in yesterday reporting continued intermittent dyspnea, sensation of elevated HR, nausea, poor appetite, and fatigue. It's not necessarily tachypalpitations so much as awareness of increased HR (happening right now in NSR 80s). She was started on amlodipine 5mg  via phone notes. She took one yesterday PM and felt somewhat better for a period of time. She was asked to f/u here today. She continues to have the above symptoms, generally unchanged from when she saw Dr. Acie Fredrickson earlier this month. No chest pain, syncope, bleeding, LEE, orthopnea.   Past Medical History  Diagnosis Date  . Essential hypertension   . Hyperlipidemia   . Carpal tunnel syndrome     RIGHT HAND  . Anxiety   . Depression   . Arthritis     shoulder  . GERD (gastroesophageal reflux disease)     . Hypothyroidism   . LBBB (left bundle branch block)     a. negative nuclear stress test 06/2011.  . Migraines   . Obesity   . Diastolic dysfunction     a. 2D echo 09/08/15: EF 55-60%, grade 1 DD, elevated LVEDP, mild MR, mild TR.    Past Surgical History  Procedure Laterality Date  . Hemithyroidectomy    . Rupture belly button      WHEN SHE WAS AN INFANT  . Dermoid cyst  excision      OF HER RIGHT OVARY. SIZE OF A GRAPEFRUIT. OVARY IS STILL THERE.  . Tonsilectomy, adenoidectomy, bilateral myringotomy and tubes      AGE 69, pt states she didn't have tubes put into her ears    Current Outpatient Prescriptions  Medication Sig Dispense Refill  . amLODipine (NORVASC) 5 MG tablet Take 1 tablet (5 mg total) by mouth daily. 30 tablet 3  . levothyroxine (SYNTHROID, LEVOTHROID) 88 MCG tablet Take 88 mcg by mouth daily before breakfast.    . rosuvastatin (CRESTOR) 10 MG tablet Take 10 mg by mouth daily.    . valsartan (DIOVAN) 320 MG tablet Take 320 mg by mouth daily.  0   No current facility-administered medications for this visit.    Allergies:   Sulfur   Social History:  The patient  reports that she has never smoked. She has never used smokeless tobacco. She reports that she does not drink alcohol or use illicit drugs.   Family History:  The patient's family history includes Colon cancer in her maternal uncle; Diabetes in her mother; Heart disease in her father; Ovarian cancer in her maternal grandmother and mother. There is no history of Esophageal cancer, Rectal cancer, or Stomach cancer.  ROS:  Please see the history of present illness.  All other systems are reviewed and otherwise negative.   PHYSICAL EXAM:  VS:  BP 164/94 mmHg  Pulse 79  Ht 5' 4.5" (1.638 m)  Wt 225 lb 6.4 oz (102.241 kg)  BMI 38.11 kg/m2  SpO2 98% BMI: Body mass index is 38.11 kg/(m^2). Pulse ox 97-98% with ambulation and rest on room air. BP recheck 149/87. Well nourished, well developed obese WF, in no  acute distress HEENT: normocephalic, atraumatic Neck: no JVD, carotid bruits or masses Cardiac:  normal S1, S2; RRR; no murmurs, rubs, or gallops Lungs:  clear to auscultation bilaterally, no wheezing, rhonchi or rales Abd: soft, nontender, no hepatomegaly, + BS MS: no deformity or atrophy Ext: no edema Skin: warm and dry, no rash Neuro:  moves all extremities spontaneously, no focal abnormalities noted, follows commands Psych: euthymic mood, full affect  EKG:  Done today shows NSR, right axis devation, NSIVCD 79bpm - no acute change from prior  Recent Labs: 07/07/2015: BUN 8; Creatinine, Ser 0.88; Hemoglobin 14.9; Platelets 214; Potassium 3.6; Sodium 140  No results found for requested labs within last 365 days.   CrCl cannot be calculated (Patient has no serum creatinine result on file.).   Wt Readings from Last 3 Encounters:  09/16/15 225 lb 6.4 oz (102.241 kg)  08/31/15 230 lb (104.327 kg)  07/02/15 237 lb (107.502 kg)     Other studies reviewed: Additional studies/records reviewed today include: summarized above  ASSESSMENT AND PLAN:  1. SOB/dizziness/nausea - suspect symptoms may be related to recently uncontrolled BP. She has had upward titrations of medications as above but BP remains elevated. Note recent echocardiogram suggested diastolic dysfunction with elevated LV filling pressures. She may see improvement on a diuretic. Will try a trial of Lasix 20mg  daily for now. I instructed her to increase her potassium intake while on this. Check labs including CMET, CBC, TSH, BNP to evaluate for metabolic causes for her symptoms. Continue other meds for now. If dyspnea persists despite improved BP control, would consider updating her stress test. She is not reporting any chest pain or pressure. 2. Essential HTN - see above regarding med additions. If BP remains poorly controlled, will consider renal artery duplex. She plans to get a BP cuff at home. I asked her to bring a log of  readings to her f/u appt. 3. Diastolic dysfunction - see above. BP control long-term will be important. 4. Obesity - weight loss recommended. Patient is motivated to exercise but has not felt well enough to do so recently.  Disposition: F/u with me in 1 week.  Current medicines are reviewed at length with the patient today.  The patient did not have any concerns regarding medicines.  Raechel Ache PA-C 09/16/2015 9:51 AM     [place] [address] [phone] (office)  [phone] (fax)

## 2015-09-15 NOTE — Telephone Encounter (Signed)
Pt c/o Shortness Of Breath: STAT if SOB developed within the last 24 hours or pt is noticeably SOB on the phone  1. Are you currently SOB (can you hear that pt is SOB on the phone)? Yes  2. How long have you been experiencing SOB? This morning at 10:00  3. Are you SOB when sitting or when up moving around? Both  4. Are you currently experiencing any other symptoms? Dizziness and nausea

## 2015-09-16 ENCOUNTER — Ambulatory Visit (INDEPENDENT_AMBULATORY_CARE_PROVIDER_SITE_OTHER): Payer: BLUE CROSS/BLUE SHIELD | Admitting: Physician Assistant

## 2015-09-16 ENCOUNTER — Encounter: Payer: Self-pay | Admitting: Physician Assistant

## 2015-09-16 ENCOUNTER — Telehealth: Payer: Self-pay | Admitting: Physician Assistant

## 2015-09-16 VITALS — BP 164/94 | HR 79 | Ht 64.5 in | Wt 225.4 lb

## 2015-09-16 DIAGNOSIS — I5189 Other ill-defined heart diseases: Secondary | ICD-10-CM

## 2015-09-16 DIAGNOSIS — I10 Essential (primary) hypertension: Secondary | ICD-10-CM

## 2015-09-16 DIAGNOSIS — I519 Heart disease, unspecified: Secondary | ICD-10-CM | POA: Diagnosis not present

## 2015-09-16 DIAGNOSIS — R0602 Shortness of breath: Secondary | ICD-10-CM

## 2015-09-16 DIAGNOSIS — E669 Obesity, unspecified: Secondary | ICD-10-CM | POA: Diagnosis not present

## 2015-09-16 LAB — COMPREHENSIVE METABOLIC PANEL
ALK PHOS: 85 U/L (ref 33–130)
ALT: 15 U/L (ref 6–29)
AST: 18 U/L (ref 10–35)
Albumin: 4.4 g/dL (ref 3.6–5.1)
BUN: 18 mg/dL (ref 7–25)
CO2: 27 mmol/L (ref 20–31)
Calcium: 9.5 mg/dL (ref 8.6–10.4)
Chloride: 103 mmol/L (ref 98–110)
Creat: 0.68 mg/dL (ref 0.50–1.05)
GLUCOSE: 98 mg/dL (ref 65–99)
POTASSIUM: 3.8 mmol/L (ref 3.5–5.3)
Sodium: 144 mmol/L (ref 135–146)
Total Bilirubin: 0.9 mg/dL (ref 0.2–1.2)
Total Protein: 7.2 g/dL (ref 6.1–8.1)

## 2015-09-16 LAB — CBC WITH DIFFERENTIAL/PLATELET
BASOS PCT: 0 % (ref 0–1)
Basophils Absolute: 0 10*3/uL (ref 0.0–0.1)
Eosinophils Absolute: 0 10*3/uL (ref 0.0–0.7)
Eosinophils Relative: 0 % (ref 0–5)
HEMATOCRIT: 40.1 % (ref 36.0–46.0)
HEMOGLOBIN: 14.4 g/dL (ref 12.0–15.0)
Lymphocytes Relative: 14 % (ref 12–46)
Lymphs Abs: 1.2 10*3/uL (ref 0.7–4.0)
MCH: 30.1 pg (ref 26.0–34.0)
MCHC: 35.9 g/dL (ref 30.0–36.0)
MCV: 83.9 fL (ref 78.0–100.0)
MONO ABS: 0.4 10*3/uL (ref 0.1–1.0)
MONOS PCT: 5 % (ref 3–12)
MPV: 9.3 fL (ref 8.6–12.4)
NEUTROS ABS: 6.9 10*3/uL (ref 1.7–7.7)
Neutrophils Relative %: 81 % — ABNORMAL HIGH (ref 43–77)
Platelets: 213 10*3/uL (ref 150–400)
RBC: 4.78 MIL/uL (ref 3.87–5.11)
RDW: 13.1 % (ref 11.5–15.5)
WBC: 8.5 10*3/uL (ref 4.0–10.5)

## 2015-09-16 LAB — TSH: TSH: 1.267 u[IU]/mL (ref 0.350–4.500)

## 2015-09-16 MED ORDER — FUROSEMIDE 20 MG PO TABS: 20.0000 mg | ORAL_TABLET | Freq: Every day | ORAL | Status: DC

## 2015-09-16 NOTE — Telephone Encounter (Signed)
error 

## 2015-09-16 NOTE — Patient Instructions (Addendum)
Medication Instructions:  Your physician has recommended you make the following change in your medication:  1.  START Lasix 20 mg tablet taking 1 tablet daily  Labwork: TODAY:  CMET                CBC W/DIFF                TSH                BNP  Testing/Procedures: None ordered  Follow-Up: Your physician recommends that you schedule a follow-up appointment in: [place], PA-C (FLEX IS OK IF THAT IS ALL THAT IS AVAILABLE)  Any Other Special Instructions Will Be Listed Below (If Applicable).  Bring a log of your blood pressure readings with you to your follow-up appointment next week.

## 2015-09-17 ENCOUNTER — Telehealth: Payer: Self-pay | Admitting: Cardiology

## 2015-09-17 LAB — BRAIN NATRIURETIC PEPTIDE: Brain Natriuretic Peptide: 33.5 pg/mL (ref 0.0–100.0)

## 2015-09-17 NOTE — Telephone Encounter (Signed)
Pt called asked if she should continue Lasix since she didn't seem to be urinating any more than usual. I believe this was added for HTN, not HF and I encouraged her to continue it.   Kerin Ransom PA-C 09/17/2015 3:52 PM

## 2015-09-18 ENCOUNTER — Telehealth: Payer: Self-pay | Admitting: Cardiology

## 2015-09-18 NOTE — Telephone Encounter (Signed)
Pt called, lots of questions about her medications- concerned her B/P was too low- 188/86. I suggested she continue her medications as ordered, keep f/u later this week.  Kerin Ransom PA-C 09/18/2015 10:10 AM

## 2015-09-21 ENCOUNTER — Encounter: Payer: Self-pay | Admitting: Physician Assistant

## 2015-09-21 NOTE — Progress Notes (Addendum)
Cardiology Office Note Date:  09/22/2015  Patient ID:  Carolyn, Mcguire 1956-11-23, MRN [id number] PCP:  Horatio Pel, MD  Cardiologist:  Dr. Acie Fredrickson  Chief Complaint: f/u BP  History of Present Illness: Carolyn Mcguire is a 59 y.o. female with history of HTN, HLD, hypothyroidism, LBBB (prior negative nuclear stress test 06/2011), migraines, depression, obesity who presents for evaluation of SOB, malaise, and dizziness. She saw Dr. Acie Fredrickson earlier this month at which time she was reporting dyspnea felt partly due to her deconditioning and diastolic dysfunction. 2D echo 09/08/15: EF 55-60%, grade 1 DD, elevated LVEDP, mild MR, mild TR. Weight loss was recommended. She has history of not feeling well intermittently dating back to August of this year. 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 She keeps a paper calendar with these unfortunate events logged on it. She has h/o normal sleep study.  I saw her last week 09/16/15 for dyspnea and BP issues. She had seen her PCP earlier in the week at which time BP was 151/90 thus Diovan was increased, unfortunately without signficant improvement. She called in reporting continued intermittent dyspnea, sensation of elevated HR, nausea, poor appetite, and fatigue. It was not necessarily tachypalpitations so much as awareness of increased HR (happening in the office while NSR 80s). She was started on amlodipine 5mg  via phone notes. I started her on Lasix 20mg  daily to help with BP. BNP was normal. Labs showed normal CBC, BNP, thyroid function, and CMET.  She comes back in today for follow-up feeling better. She brings in a log of BPs, most of which hover around 110/70. Her dyspnea has significantly improved. O2 sat is normal. She still feels a sensation of "pounding" heartbeat (including  right now in NSR HR mid-90s). No chest pain. No syncope. No LEE or erythema. She has committed to eating healthier - reducing salt/fat intake and avoiding restaurant foods now. Weight is down 5lb from last visit.   Past Medical History  Diagnosis Date  . Essential hypertension   . Hyperlipidemia   . Carpal tunnel syndrome     RIGHT HAND  . Anxiety   . Depression   . Arthritis     shoulder  . GERD (gastroesophageal reflux disease)   . Hypothyroidism   . LBBB (left bundle branch block)     a. negative nuclear stress test 06/2011.  . Migraines   . Obesity   . Diastolic dysfunction     a. 2D echo 09/08/15: EF 55-60%, grade 1 DD, elevated LVEDP, mild MR, mild TR. - normal BNP 08/2015.    Past Surgical History  Procedure Laterality Date  . Hemithyroidectomy    . Rupture belly button      WHEN SHE WAS AN INFANT  . Dermoid cyst  excision      OF HER RIGHT OVARY. SIZE OF A GRAPEFRUIT. OVARY IS STILL THERE.  . Tonsilectomy, adenoidectomy, bilateral myringotomy and tubes      AGE 61, pt states she didn't have tubes put into her ears    Current Outpatient Prescriptions  Medication Sig Dispense Refill  . amLODipine (NORVASC) 5 MG tablet Take 1 tablet (5 mg total) by mouth daily. 30 tablet 3  . furosemide (LASIX) 20 MG tablet Take 1 tablet (20 mg total) by mouth daily. 90 tablet 3  .  levothyroxine (SYNTHROID, LEVOTHROID) 88 MCG tablet Take 88 mcg by mouth daily before breakfast.    . rosuvastatin (CRESTOR) 10 MG tablet Take 10 mg by mouth daily.    . valsartan (DIOVAN) 320 MG tablet Take 320 mg by mouth daily.  0   No current facility-administered medications for this visit.    Allergies:   Sulfur   Social History:  The patient  reports that she has never smoked. She has never used smokeless tobacco. She reports that she does not drink alcohol or use illicit drugs.   Family History:  The patient's family history includes Colon cancer in her maternal uncle; Diabetes in her mother;  Heart disease in her father; Ovarian cancer in her maternal grandmother and mother. There is no history of Esophageal cancer, Rectal cancer, or Stomach cancer.  ROS:  Please see the history of present illness.  All other systems are reviewed and otherwise negative.   PHYSICAL EXAM:  VS:  BP 130/70 mmHg  Pulse 92  Ht 5' 4.5" (1.638 m)  Wt 220 lb 12.8 oz (100.154 kg)  BMI 37.33 kg/m2  SpO2 99% BMI: Body mass index is 37.33 kg/(m^2). Well nourished, well developed obese WF, in no acute distress HEENT: normocephalic, atraumatic Neck: no JVD, carotid bruits or masses Cardiac:  normal S1, S2; RRR; no murmurs, rubs, or gallops Lungs:  clear to auscultation bilaterally, no wheezing, rhonchi or rales Abd: soft, nontender, no hepatomegaly, + BS MS: no deformity or atrophy Ext: no edema Skin: warm and dry, no rash Neuro:  moves all extremities spontaneously, no focal abnormalities noted, follows commands Psych: euthymic mood, full affect  Recent Labs: 09/16/2015: ALT 15; BUN 18; Creat 0.68; Hemoglobin 14.4; Platelets 213; Potassium 3.8; Sodium 144; TSH 1.267  No results found for requested labs within last 365 days.   Estimated Creatinine Clearance: 90.1 mL/min (by C-G formula based on Cr of 0.68).   Wt Readings from Last 3 Encounters:  09/22/15 220 lb 12.8 oz (100.154 kg)  09/16/15 225 lb 6.4 oz (102.241 kg)  08/31/15 230 lb (104.327 kg)     Other studies reviewed: Additional studies/records reviewed today include: summarized above  ASSESSMENT AND PLAN:  1. SOB/dizziness/palpitations - suspect dyspnea was due to uncontrolled HTN. She still notices awareness of elevated/strong heartbeat while in NSR. HR is in the mid-upper 90s and she may just have physiologic awareness of this. Suspect this is partially due to deconditioning. Will try switching her amlodipine to Toprol XL 25mg  daily to decrease force of myocardial contractility. She has no signs or symptoms now of PE. Dyspnea has  significantly improved and pulse ox is normal. I have asked her to continue to follow BP, HR, and symptoms and let us know how things go. 2. Uncontrolled HTN - improved with addition of Lasix. Will check BMET today. If she continues to lose weight, we may eventually be able to discontinue Lasix based on BP trend. Addendum: labs now show that she has sufficiently put out enough fluid and has reached the drier side. Will stop Lasix. Will have her continue to follow BP with the med changes we made today, and to call if BP begins running >130/80.  3. Diastolic dysfunction - recent BNP normal. Continue BP control. 4. Obesity Body mass index is 37.33 kg/(m^2). - congratulated patient on her strides towards healthier lifestyle. She plans to enroll in the [facility] to exercise regularly.  Disposition: F/u with Dr. Acie Fredrickson as previously planned 6 months from his OV on 08/31/15.  Current medicines are reviewed at length with the patient today.  The patient did not have any concerns regarding medicines.  Raechel Ache PA-C 09/22/2015 10:41 AM     [facility] [place] [address] [phone] (office)  [phone] (fax)

## 2015-09-22 ENCOUNTER — Ambulatory Visit (INDEPENDENT_AMBULATORY_CARE_PROVIDER_SITE_OTHER): Payer: BLUE CROSS/BLUE SHIELD | Admitting: Physician Assistant

## 2015-09-22 ENCOUNTER — Encounter: Payer: Self-pay | Admitting: Physician Assistant

## 2015-09-22 VITALS — BP 130/70 | HR 92 | Ht 64.5 in | Wt 220.8 lb

## 2015-09-22 DIAGNOSIS — R5381 Other malaise: Secondary | ICD-10-CM

## 2015-09-22 DIAGNOSIS — I519 Heart disease, unspecified: Secondary | ICD-10-CM | POA: Diagnosis not present

## 2015-09-22 DIAGNOSIS — I10 Essential (primary) hypertension: Secondary | ICD-10-CM

## 2015-09-22 DIAGNOSIS — E669 Obesity, unspecified: Secondary | ICD-10-CM

## 2015-09-22 DIAGNOSIS — R0600 Dyspnea, unspecified: Secondary | ICD-10-CM | POA: Diagnosis not present

## 2015-09-22 DIAGNOSIS — I5189 Other ill-defined heart diseases: Secondary | ICD-10-CM

## 2015-09-22 LAB — BASIC METABOLIC PANEL
BUN: 36 mg/dL — AB (ref 7–25)
CHLORIDE: 101 mmol/L (ref 98–110)
CO2: 24 mmol/L (ref 20–31)
Calcium: 9.7 mg/dL (ref 8.6–10.4)
Creat: 1.08 mg/dL — ABNORMAL HIGH (ref 0.50–1.05)
GLUCOSE: 120 mg/dL — AB (ref 65–99)
POTASSIUM: 4 mmol/L (ref 3.5–5.3)
Sodium: 138 mmol/L (ref 135–146)

## 2015-09-22 MED ORDER — METOPROLOL SUCCINATE ER 25 MG PO TB24: 25.0000 mg | ORAL_TABLET | Freq: Every day | ORAL | Status: DC

## 2015-09-22 NOTE — Progress Notes (Signed)
Quick Note:  Please call patient. Labs now show that she has sufficiently put out enough fluid and has reached the drier side. Please stop Lasix - continue to follow BP with the med change we made today - call if it begins running >130/80. Please also let her know that her blood sugar is mildly elevated and should be followed periodically by her PCP. Thanks! Karalynn Cottone PA-C  ______

## 2015-09-22 NOTE — Patient Instructions (Addendum)
Medication Instructions:  Your physician has recommended you make the following change in your medication:  1.  STOP the Amlodipine 2.  START Toprol XL 25 mg taking 1 tablet daily  Labwork: TODAY:  BMET   Testing/Procedures: None ordered  Follow-Up: Your physician wants you to follow-up in: [place] Acie Fredrickson.  You will receive a reminder letter in the mail two months in advance. If you don't receive a letter, please call our office to schedule the follow-up appointment.     Any Other Special Instructions Will Be Listed Below (If Applicable).   If you need a refill on your cardiac medications before your next appointment, please call your pharmacy.

## 2015-09-28 ENCOUNTER — Telehealth: Payer: Self-pay | Admitting: Cardiovascular Disease

## 2015-09-28 NOTE — Telephone Encounter (Signed)
Pt called to report her BP readings. This AM BP 130/82 pulse 101 beats/minute. Pt's BP last night was 147/76. Pt is taking Diovan 320 mg daily,and Metoprolol 25 mg daily . Pt states that the last two nights she has been eating out; first night in a restaurant, second night she ate in a friend's house. Pt thinks that the food may had too much salt added. Pt also started having a head cold. This may added to that BP fluctuating. Pt called because she was told to call the office if her BP was greater than  130/70'S. Pt is aware to call in 2 days if the BP continues to go up. Pt verbalized understanding.

## 2015-09-28 NOTE — Telephone Encounter (Signed)
Pt c/o BP issue: STAT if pt c/o blurred vision, one-sided weakness or slurred speech  1. What are your last 5 BP readings? 130/82 p 101 (this am- 11/2),147/70 p 76 (last night 1130 pm- 11/1)  2. Are you having any other symptoms (ex. Dizziness, headache, blurred vision, passed out)? Congestion, little dizzy, headache   3. What is your BP issue? Pt was told to contact office if BP creeps up

## 2015-09-28 NOTE — Telephone Encounter (Signed)
The BP and HR readings are not bad Continue to monitor

## 2015-09-30 ENCOUNTER — Telehealth: Payer: Self-pay | Admitting: Cardiovascular Disease

## 2015-09-30 NOTE — Telephone Encounter (Signed)
Spoke with patient who states she has had fast heart rate over the past few days.  She reports blood pressure and pulse readings below; states both have improved since she called earlier.  States currently:  BP is 126/76 mmHg, pulse 91 bpm.  She states she does not have a fever that she is aware of and is not taking any cold medications.  She states she is staying well hydrated by drinking water.  I encouraged her to continue to drink plenty of fluids, to rest through the weekend and that if cold symptoms worsen that she can take Mucinex and/or Tylenol.  I advised that we would want to make medication changes based on fast pulse readings once her cold symptoms improve.  I advised her to call back next week if symptoms do not improve.  She verbalized understanding and agreement with plan of care.

## 2015-09-30 NOTE — Telephone Encounter (Signed)
New message     Pt c/o medication issue:  1. Name of Medication: metoprolol 2. How are you currently taking this medication (dosage and times per day)? 25mg  daily 3. Are you having a reaction (difficulty breathing--STAT)? no 4. What is your medication issue?  Pt has a cold, and states her HR is 99 at 3:25 pm  125/74, 128/77 with 92HR at 9:30 am.  Is this pulse rate high? Could it be that the medication is not working?

## 2015-11-02 ENCOUNTER — Encounter: Payer: Self-pay | Admitting: Physician Assistant

## 2015-11-02 ENCOUNTER — Encounter: Payer: Self-pay | Admitting: Gastroenterology

## 2015-11-09 ENCOUNTER — Telehealth: Payer: Self-pay | Admitting: Physician Assistant

## 2015-11-09 ENCOUNTER — Encounter: Payer: Self-pay | Admitting: Physician Assistant

## 2015-11-09 ENCOUNTER — Other Ambulatory Visit: Payer: Self-pay | Admitting: *Deleted

## 2015-11-09 ENCOUNTER — Ambulatory Visit (INDEPENDENT_AMBULATORY_CARE_PROVIDER_SITE_OTHER): Payer: BLUE CROSS/BLUE SHIELD | Admitting: Physician Assistant

## 2015-11-09 VITALS — BP 130/70 | HR 76 | Ht 64.5 in | Wt 222.8 lb

## 2015-11-09 DIAGNOSIS — R110 Nausea: Secondary | ICD-10-CM | POA: Diagnosis not present

## 2015-11-09 DIAGNOSIS — R1013 Epigastric pain: Secondary | ICD-10-CM

## 2015-11-09 MED ORDER — ESOMEPRAZOLE MAGNESIUM 40 MG PO CPDR: DELAYED_RELEASE_CAPSULE | ORAL | Status: DC

## 2015-11-09 MED ORDER — ESOMEPRAZOLE MAGNESIUM 40 MG PO CPDR: 40.0000 mg | DELAYED_RELEASE_CAPSULE | Freq: Every day | ORAL | Status: DC

## 2015-11-09 NOTE — Patient Instructions (Addendum)
You have been scheduled for an abdominal ultrasound at [facility] (1st floor of hospital) on 11-14-2015 at 7:30 am . Please arrive at 7;15 am to your appointment for registration. Make certain not to have anything to eat or drink after midnight for your your appointment. Should you need to reschedule your appointment, please contact radiology at [phone]. This test typically takes about 30 minutes to perform.  You have been scheduled for an endoscopy. Please follow written instructions given to you at your visit today. If you use inhalers (even only as needed), please bring them with you on the day of your procedure. Your physician has requested that you go to www.startemmi.com and enter the access code given to you at your visit today. This web site gives a general overview about your procedure. However, you should still follow specific instructions given to you by our office regarding your preparation for the procedure.  We sent a prescription for Nexium 40 mg by mouth every morning.[facility] [address] / [place].

## 2015-11-09 NOTE — Progress Notes (Addendum)
Patient ID: Carolyn Mcguire, female   DOB: [birth year], 59 y.o.   MRN: [id number]   Subjective:    Patient ID: Carolyn Mcguire, female    DOB: 06-02-1956, 59 y.o.   MRN: [id number]  HPI  Miste is a 59 year old white female known to Dr.Pyrtle. She was last seen here couple of years ago for colon cancer screening and underwent colonoscopy 6 2013. She was found to have 3 small polyps which were removed. One of these was a tubular adenoma the others benign lymphoid polyps 2. She is to have 5 year interval follow-up. She comes in today with complaints of discomfort and nausea over the past month. She says her nausea symptoms actually have been present since August off and on. She sees says it seems to come  and go but never completely resolves. Now over the past couple of weeks she has had more ongoing epigastric discomfort. She describes a knot-type sensation in her lower chest and epigastric area. Not on any regular aspirin or NSAIDs, no new meds. She does relate been under a lot of stress over the past year or so and wonders if this may be playing a role. She has lost 30 pounds over the past this past year as well- intentionally. She has been on Nexium 20 mg by mouth daily just over the past couple of weeks.  Review of Systems Pertinent positive and negative review of systems were noted in the above HPI section.  All other review of systems was otherwise negative.  Outpatient Encounter Prescriptions as of 11/09/2015  Medication Sig  . esomeprazole (NEXIUM) 20 MG capsule Take 20 mg by mouth daily.  Marland Kitchen levothyroxine (SYNTHROID, LEVOTHROID) 88 MCG tablet Take 88 mcg by mouth daily before breakfast.  . metoprolol succinate (TOPROL XL) 25 MG 24 hr tablet Take 1 tablet (25 mg total) by mouth daily.  . rosuvastatin (CRESTOR) 10 MG tablet Take 10 mg by mouth daily.  . valsartan (DIOVAN) 320 MG tablet Take 320 mg by mouth daily.  Marland Kitchen esomeprazole (NEXIUM) 40 MG capsule Take 1 capsule (40 mg total) by mouth daily  at 12 noon.   No facility-administered encounter medications on file as of 11/09/2015.   Allergies  Allergen Reactions  . Sulfur Rash    MAKES PT RED   Patient Active Problem List   Diagnosis Date Noted  . Diastolic dysfunction [id number]  . Obesity   . Essential hypertension   . Hyperlipidemia   . HTN (hypertension) 04/23/2012  . LBBB (left bundle branch block) 06/15/2011   Social History   Social History  . Marital Status: Single    Spouse Name: N/A  . Number of Children: 0  . Years of Education: N/A   Occupational History  . Unemployed    Social History Main Topics  . Smoking status: Never Smoker   . Smokeless tobacco: Never Used  . Alcohol Use: No  . Drug Use: No  . Sexual Activity: Not on file   Other Topics Concern  . Not on file   Social History Narrative    Ms. Lerman's family history includes Colon cancer in her maternal uncle; Diabetes in her mother; Heart disease in her father; Ovarian cancer in her maternal grandmother and mother. There is no history of Esophageal cancer, Rectal cancer, or Stomach cancer.      Objective:    Filed Vitals:   11/09/15 0832  BP: 130/70  Pulse: 76    Physical Exam  well-developed white female in no  acute distress, pleasant blood pressure 130/70 pulse 76 height 5 foot 4 weight 222, BMI 37.6. HEENT; nontraumatic normocephalic EOMI PERRLA sclera anicteric, Supple; no JVD, Cardiovascular; regular rate and rhythm with S1-S2 no murmur or gallop, Pulmonary; clear bilaterally, Abdomen; soft, she minimally tender in the epigastrium, no guarding or rebound no palpable mass or hepatosplenomegaly she does have a diastases, bowel sounds present, Rectal ;exam not done, Ext; no clubbing cyanosis or edema skin warm and dry, Neuropsych ;mood and affect appropriate       Assessment & Plan:   #1 59 yo female with nausea and few week hx of upper abdominal discomfort- r/o gastritis/PUD/GERD, r/o GB disease #2 hx adenomatous polyp 2013-  due for Follow up colonoscopy 2018 #3 HTN  #4 obesity #5LBBB  Plan; Increase Nexium  to 40 mg po qam Schedule for Upper abdominal US Schedule for EGD with Dr Hilarie Fredrickson . Procedure discussed in deatil with pt and she is agreeable to proceed. Labs were done 08/2015- reviewed and unremarkable.   Javaya [place] S Jaeceon Michelin PA-C 11/09/2015   Cc: [name], MD  Addendum: Reviewed and agree with initial management. Jerene Bears, MD

## 2015-11-09 NOTE — Telephone Encounter (Signed)
The patient had a question on her Nexium 40 mg directions.  Amy told her to take it 1 capsule daily every morning. I typed in to take it at noon.  I apologized and changed the prescription on Epic and informed the patient to take it in the morning daily.  She thanked me for calling her back to clarify.

## 2015-11-14 ENCOUNTER — Ambulatory Visit (HOSPITAL_COMMUNITY)
Admission: RE | Admit: 2015-11-14 | Discharge: 2015-11-14 | Disposition: A | Discharge status: Home / Self Care | Payer: BLUE CROSS/BLUE SHIELD | Source: Ambulatory Visit | Attending: Physician Assistant | Admitting: Physician Assistant

## 2015-11-14 DIAGNOSIS — K760 Fatty (change of) liver, not elsewhere classified: Secondary | ICD-10-CM | POA: Diagnosis not present

## 2015-11-14 DIAGNOSIS — R110 Nausea: Secondary | ICD-10-CM | POA: Diagnosis present

## 2015-11-14 DIAGNOSIS — R1013 Epigastric pain: Secondary | ICD-10-CM | POA: Diagnosis not present

## 2015-11-26 ENCOUNTER — Telehealth: Payer: Self-pay | Admitting: Physician Assistant

## 2015-11-26 NOTE — Telephone Encounter (Signed)
     Called bc her BP was >130/80 but she had not taken her BP meds that day. I told her to only call if BP elevated after she has taken her meds. Also told her that her goal for BP according to JNC 8 is <140/90. She understood.   Angelena Form PA-C  [facility]

## 2015-12-28 ENCOUNTER — Ambulatory Visit (AMBULATORY_SURGERY_CENTER): Payer: BLUE CROSS/BLUE SHIELD | Admitting: Internal Medicine

## 2015-12-28 ENCOUNTER — Encounter: Payer: Self-pay | Admitting: Internal Medicine

## 2015-12-28 VITALS — BP 131/64 | HR 72 | Temp 97.5°F | Resp 15 | Wt 222.0 lb

## 2015-12-28 DIAGNOSIS — R1013 Epigastric pain: Secondary | ICD-10-CM | POA: Diagnosis not present

## 2015-12-28 DIAGNOSIS — K2980 Duodenitis without bleeding: Secondary | ICD-10-CM | POA: Diagnosis not present

## 2015-12-28 DIAGNOSIS — K317 Polyp of stomach and duodenum: Secondary | ICD-10-CM | POA: Diagnosis not present

## 2015-12-28 DIAGNOSIS — K3189 Other diseases of stomach and duodenum: Secondary | ICD-10-CM | POA: Diagnosis not present

## 2015-12-28 LAB — GLUCOSE, CAPILLARY: Glucose-Capillary: 70 mg/dL (ref 65–99)

## 2015-12-28 MED ORDER — SODIUM CHLORIDE 0.9 % IV SOLN: 500.0000 mL | INTRAVENOUS | Status: DC

## 2015-12-28 NOTE — Progress Notes (Signed)
Called to room to assist during endoscopic procedure.  Patient ID and intended procedure confirmed with present staff. Received instructions for my participation in the procedure from the performing physician.  

## 2015-12-28 NOTE — Op Note (Signed)
[facility]  [address] [place], [address]   ENDOSCOPY PROCEDURE REPORT  PATIENT: Carolyn, Mcguire  MR#: [id number] BIRTHDATE: [birth year] , 69  yrs. old GENDER: female ENDOSCOPIST: Jerene Bears, MD PROCEDURE DATE:  12/28/2015 PROCEDURE:  EGD, diagnostic and EGD w/ biopsy ASA CLASS:     Class II INDICATIONS:  epigastric pain. MEDICATIONS: Monitored anesthesia care and Propofol 180 mg IV TOPICAL ANESTHETIC: none  DESCRIPTION OF PROCEDURE: After the risks benefits and alternatives of the procedure were thoroughly explained, informed consent was obtained.  The LB [id number] endoscope was introduced through the mouth and advanced to the second portion of the duodenum , Without limitations.  The instrument was slowly withdrawn as the mucosa was fully examined.  ESOPHAGUS: The mucosa of the esophagus appeared normal. Z-line regular.  STOMACH: Nodular mucosa was found in the prepyloric region of the stomach extending to the pyloric channel.  No ulcers.  Multiple biopsies were performed using cold forceps on this area to exclude hyperplastic or adenomatous polyp.   The stomach otherwise appeared normal, and biopsies were taken from the gastric body, antrum and incisura to exclude H. pylori.  DUODENUM: A sessile polyp measuring 3 mm in size was found in the duodenal bulb.  A polypectomy was performed with a cold forceps. The resection was complete and the polyp tissue was completely retrieved.  The duodenal mucosa was otherwise normal in the bulb and second portion.  Retroflexed views revealed no abnormalities.     The scope was then withdrawn from the patient and the procedure completed.  COMPLICATIONS: There were no immediate complications.  ENDOSCOPIC IMPRESSION: 1.   The mucosa of the esophagus appeared normal 2.   Nodular mucosa was found in the prepyloric region of the stomach; multiple biopsies were performed 3.   The stomach otherwise appeared  normal; multiple biopsies 4.   Sessile polyp measuring 3 mm in size was found in the duodenal bulb; polypectomy was performed, otherwise normal duodenum  RECOMMENDATIONS: 1.  Await biopsy results 2.  If pathology results unrevealing and epigastric pain continues consider CCK HIDA scan to rule out biliary dyskinesia  eSigned:  Jerene Bears, MD 12/28/2015 10:33 AM CC: the patient, Dr. Shelia Media

## 2015-12-28 NOTE — Progress Notes (Signed)
A/ox3, pleased with MAC, report to RN 

## 2015-12-28 NOTE — Patient Instructions (Signed)
YOU HAD AN ENDOSCOPIC PROCEDURE TODAY AT [facility]:   Refer to the procedure report that was given to you for any specific questions about what was found during the examination.  If the procedure report does not answer your questions, please call your gastroenterologist to clarify.  If you requested that your care partner not be given the details of your procedure findings, then the procedure report has been included in a sealed envelope for you to review at your convenience later.  YOU SHOULD EXPECT: Some feelings of bloating in the abdomen. Passage of more gas than usual.  Walking can help get rid of the air that was put into your GI tract during the procedure and reduce the bloating. If you had a lower endoscopy (such as a colonoscopy or flexible sigmoidoscopy) you may notice spotting of blood in your stool or on the toilet paper. If you underwent a bowel prep for your procedure, you may not have a normal bowel movement for a few days.  Please Note:  You might notice some irritation and congestion in your nose or some drainage.  This is from the oxygen used during your procedure.  There is no need for concern and it should clear up in a day or so.  SYMPTOMS TO REPORT IMMEDIATELY:    Following upper endoscopy (EGD)  Vomiting of blood or coffee ground material  New chest pain or pain under the shoulder blades  Painful or persistently difficult swallowing  New shortness of breath  Fever of 100F or higher  Black, tarry-looking stools  For urgent or emergent issues, a gastroenterologist can be reached at any hour by calling [phone].   DIET: Your first meal following the procedure should be a small meal and then it is ok to progress to your normal diet. Heavy or fried foods are harder to digest and may make you feel nauseous or bloated.  Likewise, meals heavy in dairy and vegetables can increase bloating.  Drink plenty of fluids but you should avoid alcoholic beverages  for 24 hours.  ACTIVITY:  You should plan to take it easy for the rest of today and you should NOT DRIVE or use heavy machinery until tomorrow (because of the sedation medicines used during the test).    FOLLOW UP: Our staff will call the number listed on your records the next business day following your procedure to check on you and address any questions or concerns that you may have regarding the information given to you following your procedure. If we do not reach you, we will leave a message.  However, if you are feeling well and you are not experiencing any problems, there is no need to return our call.  We will assume that you have returned to your regular daily activities without incident.  If any biopsies were taken you will be contacted by phone or by letter within the next 1-3 weeks.  Please call us at [phone] if you have not heard about the biopsies in 3 weeks.    SIGNATURES/CONFIDENTIALITY: You and/or your care partner have signed paperwork which will be entered into your electronic medical record.  These signatures attest to the fact that that the information above on your After Visit Summary has been reviewed and is understood.  Full responsibility of the confidentiality of this discharge information lies with you and/or your care-partner.  Await pathology results Polyp handout given

## 2015-12-29 ENCOUNTER — Telehealth: Payer: Self-pay

## 2015-12-29 NOTE — Telephone Encounter (Signed)
  Follow up Call-  Call back number 12/28/2015  Post procedure Call Back phone  # [phone]  Permission to leave phone message Yes     Patient questions:  Do you have a fever, pain , or abdominal swelling? No. Pain Score  0 *  Have you tolerated food without any problems? Yes.    Have you been able to return to your normal activities? Yes.    Do you have any questions about your discharge instructions: Diet   No. Medications  No. Follow up visit  No.  Do you have questions or concerns about your Care? No.  Actions: * If pain score is 4 or above: No action needed, pain <4.

## 2016-01-03 ENCOUNTER — Encounter: Payer: Self-pay | Admitting: Internal Medicine

## 2016-01-11 ENCOUNTER — Telehealth: Payer: Self-pay | Admitting: Internal Medicine

## 2016-01-11 NOTE — Telephone Encounter (Signed)
Discussed path letter with pt and questions were answered. Path letter mailed to pt per her request.

## 2016-01-19 ENCOUNTER — Telehealth: Payer: Self-pay | Admitting: Physician Assistant

## 2016-01-19 ENCOUNTER — Telehealth: Payer: Self-pay | Admitting: Cardiovascular Disease

## 2016-01-19 NOTE — Telephone Encounter (Signed)
Pt wants to know if she can stop taking her Esomeprazole?She says she have been taking it for 2 months.

## 2016-01-19 NOTE — Telephone Encounter (Signed)
Patient would like to come off of the Nexium 40 mg that she takes once daily. She feels she has her symptoms under control. Through her insurance she is in touch with a registered dietician and she has a case manager to discuss and receive recommendations. Please advise on this.

## 2016-01-19 NOTE — Telephone Encounter (Signed)
Spoke with patient who called to ask if she can d/c esomeprazole.  I advised her that this was prescribed by Nicoletta Ba, PA @ [facility] and that she should call their office to ask for advice on stopping.  She asks if she can start taking fish oil; states it was suggested by her eye doctor to help with dry eyes.  She states her PCP monitors her hyperlipidemia.  I advised that I do not see any cardiac contraindication for her to take fish oil and advised she should consult with Dr. Shelia Media.  We discussed her mild diastolic dysfunction and ways to improve heart function including low sodium diet, exercise, good BP control, and adherence to medical therapy.  She verbalized understanding and thanked me for the call.

## 2016-01-23 NOTE — Telephone Encounter (Signed)
Patient advised.

## 2016-01-23 NOTE — Telephone Encounter (Signed)
I reviewed her chart- yes it is fine to stop nexium . If she has recurrent sxs, may try OTC Zantac or pepcid which are  different type of drug -if she is worried about side effects from nexium

## 2016-02-21 ENCOUNTER — Encounter: Payer: Self-pay | Admitting: Cardiovascular Disease

## 2016-02-21 ENCOUNTER — Ambulatory Visit (INDEPENDENT_AMBULATORY_CARE_PROVIDER_SITE_OTHER): Payer: BLUE CROSS/BLUE SHIELD | Admitting: Cardiovascular Disease

## 2016-02-21 VITALS — BP 134/68 | HR 80 | Ht 64.5 in | Wt 221.8 lb

## 2016-02-21 DIAGNOSIS — I10 Essential (primary) hypertension: Secondary | ICD-10-CM

## 2016-02-21 MED ORDER — METOPROLOL SUCCINATE ER 25 MG PO TB24: 25.0000 mg | ORAL_TABLET | Freq: Every day | ORAL | Status: DC

## 2016-02-21 NOTE — Progress Notes (Signed)
Carolyn Mcguire Date of Birth  13-Jan-1956 [facility] [id number]    [place], [place]  [address] [phone]  Fax  [phone]  Problem list: 1. Left bundle branch block 2. Hypothyroidism 3. Hyperlipidemia 4. Hypertension 5. Dyspnea on exertion   History of Present Illness:  60 yo female with a hx of LBBB.  Stress myoview was negative.  She's been on a diet and exercise program and has lost about 6 pounds since last year.  She is drinking more water, avoiding fast foods.  She is walking regularly.  She has been diagnosed with migraines.  She thinks muscle tension may be contributing to her headaches.  Oct. 5, 2016:  [name] is seen today for follow up of her shortness of breath Has had many deaths in her family and became depressed . Was started on Welbutrin for depression .  She was seen in the ER. Was told by the pharmacist at her primary medical doctors office thought it was due to the discontinuation of her Welbutrin. She is still not feeling 100% better so she was scheduled to see me .  She is still short of breath.    DOE  With walking up from the buildings in the back of her property.  She does not getting any regular exercise. She was bitten by a fox on July 11.  Had a series of rabies shots ( the fox was rabid)  Is retired .   She has had a stressful 2 years.    Several deaths in her family . Has not had the time or inclination  Wants to start a walking program.     No CP   February 21, 2016: Doing well Has lost some weight .   Has lost 9 lbs since I last saw her  Walks regularly     Current Outpatient Prescriptions on File Prior to Visit  Medication Sig Dispense Refill  . ALPRAZolam (XANAX) 0.5 MG tablet TK 1 T PO  TID PRN  0  . levothyroxine (SYNTHROID, LEVOTHROID) 88 MCG tablet Take 88 mcg by mouth daily before breakfast.    . metoprolol succinate (TOPROL XL) 25 MG 24 hr tablet Take 1 tablet (25 mg total) by mouth daily. 30 tablet 0  .  rosuvastatin (CRESTOR) 10 MG tablet Take 10 mg by mouth daily.    . valsartan (DIOVAN) 320 MG tablet Take 320 mg by mouth daily.  0   No current facility-administered medications on file prior to visit.    Allergies  Allergen Reactions  . Sulfur Rash    MAKES PT RED    Past Medical History  Diagnosis Date  . Essential hypertension   . Hyperlipidemia   . Carpal tunnel syndrome     RIGHT HAND  . Anxiety   . Depression   . Arthritis     shoulder  . GERD (gastroesophageal reflux disease)   . Hypothyroidism   . LBBB (left bundle branch block)     a. negative nuclear stress test 06/2011.  . Migraines   . Obesity   . Diastolic dysfunction     a. 2D echo 09/08/15: EF 55-60%, grade 1 DD, elevated LVEDP, mild MR, mild TR. - normal BNP 08/2015.  Marland Kitchen Allergic rhinitis   . OSA (obstructive sleep apnea)     mild  . Adenomatous colon polyp   . Animal bite 05/2015    fox     Past Surgical History  Procedure Laterality Date  .  Hemithyroidectomy    . Rupture belly button      WHEN SHE WAS AN INFANT  . Dermoid cyst  excision      OF HER RIGHT OVARY. SIZE OF A GRAPEFRUIT. OVARY IS STILL THERE.  . Tonsilectomy, adenoidectomy, bilateral myringotomy and tubes      AGE 89, pt states she didn't have tubes put into her ears    History  Smoking status  . Never Smoker   Smokeless tobacco  . Never Used    History  Alcohol Use No    Family History  Problem Relation Age of Onset  . Ovarian cancer Mother   . Diabetes Mother   . Colon cancer Maternal Uncle   . Ovarian cancer Maternal Grandmother   . Esophageal cancer Neg Hx   . Rectal cancer Neg Hx   . Stomach cancer Neg Hx   . Heart disease Father     Reviw of Systems:  Reviewed in the HPI.  All other systems are negative.  Physical Exam: BP 134/68 mmHg  Pulse 80  Ht 5' 4.5" (1.638 m)  Wt 221 lb 12.8 oz (100.608 kg)  BMI 37.50 kg/m2  SpO2 98% The patient is alert and oriented x 3.  The mood and affect are normal.   Skin:  warm and dry.  Color is normal.   HEENT:   Normal carotids, no JVD Lungs: clear  Heart: RR   Abdomen: normal BS, non tender Extremities:  No edema Neuro:  nonfocal    ECG:  Aug. 12, 2016:  NSR , LBBB   Assessment / Plan:   1. Left bundle branch block- will check echo to make sure her LV EF is still normal.   I doubt that her DOE is due to LBBB.  She is walking more.     2. Hypothyroidism  3. Hyperlipidemia 4. Hypertension - continue valsartan and toprol   5. Dyspnea on exertion-  This is gradually improving .    Nahser, Wonda Cheng, MD  02/21/2016 11:22 AM    [facility] [place],  [place], [place]  [address] Pager [phone] Phone: [phone]; Fax: [phone]   [facility]  [address] [place], [place]  [address] [phone]   Fax [phone]

## 2016-02-21 NOTE — Patient Instructions (Signed)
Medication Instructions:  Your physician recommends that you continue on your current medications as directed. Please refer to the Current Medication list given to you today.   Labwork: None Ordered   Testing/Procedures: None Ordered   Follow-Up: Your physician wants you to follow-up in: 1 year with Dr. Nahser.  You will receive a reminder letter in the mail two months in advance. If you don't receive a letter, please call our office to schedule the follow-up appointment.   If you need a refill on your cardiac medications before your next appointment, please call your pharmacy.   Thank you for choosing [facility]! Taralynn Quiett, RN [phone]    

## 2016-02-27 ENCOUNTER — Ambulatory Visit: Payer: BLUE CROSS/BLUE SHIELD | Admitting: Cardiovascular Disease

## 2016-03-05 DIAGNOSIS — E039 Hypothyroidism, unspecified: Secondary | ICD-10-CM | POA: Diagnosis not present

## 2016-03-05 DIAGNOSIS — I10 Essential (primary) hypertension: Secondary | ICD-10-CM | POA: Diagnosis not present

## 2016-03-05 DIAGNOSIS — E7800 Pure hypercholesterolemia, unspecified: Secondary | ICD-10-CM | POA: Diagnosis not present

## 2016-03-05 DIAGNOSIS — R5383 Other fatigue: Secondary | ICD-10-CM | POA: Diagnosis not present

## 2016-03-06 DIAGNOSIS — E039 Hypothyroidism, unspecified: Secondary | ICD-10-CM | POA: Diagnosis not present

## 2016-03-06 DIAGNOSIS — I10 Essential (primary) hypertension: Secondary | ICD-10-CM | POA: Diagnosis not present

## 2016-03-06 DIAGNOSIS — E7800 Pure hypercholesterolemia, unspecified: Secondary | ICD-10-CM | POA: Diagnosis not present

## 2016-03-08 DIAGNOSIS — M2141 Flat foot [pes planus] (acquired), right foot: Secondary | ICD-10-CM | POA: Diagnosis not present

## 2016-03-08 DIAGNOSIS — M7671 Peroneal tendinitis, right leg: Secondary | ICD-10-CM | POA: Diagnosis not present

## 2016-03-08 DIAGNOSIS — M2142 Flat foot [pes planus] (acquired), left foot: Secondary | ICD-10-CM | POA: Diagnosis not present

## 2016-03-12 ENCOUNTER — Ambulatory Visit (INDEPENDENT_AMBULATORY_CARE_PROVIDER_SITE_OTHER): Payer: BLUE CROSS/BLUE SHIELD | Admitting: Sports Medicine

## 2016-03-12 ENCOUNTER — Encounter: Payer: Self-pay | Admitting: Sports Medicine

## 2016-03-12 VITALS — BP 115/52 | Ht 64.5 in | Wt 221.0 lb

## 2016-03-12 DIAGNOSIS — M25571 Pain in right ankle and joints of right foot: Secondary | ICD-10-CM | POA: Diagnosis not present

## 2016-03-12 NOTE — Progress Notes (Signed)
   Subjective:    Patient ID: Carolyn Mcguire, female    DOB: [birth year], 58 y.o.   MRN: [id number]  HPI chief complaint: Right ankle pain  60 year old female comes in today complaining of right ankle pain that has been present since an injury in July 2016. The patient was actually bitten by a rabid fox and, in an attempt to escape, injured her right ankle. She is unsure of the exact mechanism of injury. She did have immediate pain and swelling afterwards. She was seen in the emergency room for her fox bite and was given injections for rabies. Her ankle was not addressed at that time. When her pain persisted, she eventually saw Dr. Noemi Chapel. X-rays were obtained which were negative per her report. Unfortunately, she forgot to bring those x-rays with her. Since the initial injury, she has noticed some mild persistent swelling along the lateral ankle. Persistent lateral ankle pain as well as, especially with prolonged standing and walking. No locking or catching. No medial ankle pain. Pain at times will radiate along the lateral aspect of her foot. No numbness or tingling. She denies any significant problems with this ankle in the past.  Past medical history reviewed Medications reviewed Allergies reviewed      Review of Systems    as above Objective:   Physical Exam  Well-developed, well-nourished. No acute distress.  Right ankle: Full range of motion. No obvious effusion. No soft tissue swelling. 2+ talar tilt with a positive anterior drawer. She is tender to palpation at the anterior lateral joint line. No tenderness over the ATF or calcaneal fibular ligament. No tenderness over the distal fibula nor over the medial malleolus. No tenderness to palpation along the peroneal tendon. No peroneal tendon subluxation. Neurovascularly intact distally. Walking without significant limp. Fairly neutral gait.      Assessment & Plan:   Chronic right ankle pain status post ankle injury worrisome for  possible OCD  I explained to the patient that I think we need to get an MRI to completely evaluate this injury. Her symptoms have been present now for many months and so I think she needs a definitive diagnosis. She is concerned about the cost of an MRI, so I provided her with information for 3 different radiology offices. She will try to find a facility that is somewhat affordable. I will wait to hear back from her.

## 2016-03-13 ENCOUNTER — Other Ambulatory Visit: Payer: Self-pay | Admitting: *Deleted

## 2016-03-13 DIAGNOSIS — M25571 Pain in right ankle and joints of right foot: Secondary | ICD-10-CM

## 2016-03-19 DIAGNOSIS — R936 Abnormal findings on diagnostic imaging of limbs: Secondary | ICD-10-CM | POA: Diagnosis not present

## 2016-03-19 DIAGNOSIS — S86311A Strain of muscle(s) and tendon(s) of peroneal muscle group at lower leg level, right leg, initial encounter: Secondary | ICD-10-CM | POA: Diagnosis not present

## 2016-03-21 ENCOUNTER — Ambulatory Visit: Payer: BLUE CROSS/BLUE SHIELD | Admitting: Sports Medicine

## 2016-03-21 ENCOUNTER — Telehealth: Payer: Self-pay | Admitting: Sports Medicine

## 2016-03-21 NOTE — Telephone Encounter (Signed)
I spoke with the patient on the phone today after reviewing the MRI of her right ankle. 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 It is retracted proximally about 5 cm. This is an injury that is about a year old. Based on these findings, I recommended consultation with Dr. Doran Durand to discuss treatment options. Patient will follow-up with me as needed.

## 2016-03-21 NOTE — Telephone Encounter (Signed)
[facility] Dr. Wylene Simmer Wednesday, May 3rd at [address], [place], [place] [address] Phone: [phone]

## 2016-03-28 DIAGNOSIS — S86311A Strain of muscle(s) and tendon(s) of peroneal muscle group at lower leg level, right leg, initial encounter: Secondary | ICD-10-CM | POA: Diagnosis not present

## 2016-03-28 DIAGNOSIS — H8143 Vertigo of central origin, bilateral: Secondary | ICD-10-CM | POA: Diagnosis not present

## 2016-03-28 DIAGNOSIS — H93A9 Pulsatile tinnitus, unspecified ear: Secondary | ICD-10-CM | POA: Diagnosis not present

## 2016-03-28 DIAGNOSIS — M79671 Pain in right foot: Secondary | ICD-10-CM | POA: Diagnosis not present

## 2016-04-02 DIAGNOSIS — M79671 Pain in right foot: Secondary | ICD-10-CM | POA: Diagnosis not present

## 2016-04-02 DIAGNOSIS — G8929 Other chronic pain: Secondary | ICD-10-CM | POA: Diagnosis not present

## 2016-04-06 DIAGNOSIS — N644 Mastodynia: Secondary | ICD-10-CM | POA: Diagnosis not present

## 2016-04-06 DIAGNOSIS — M79671 Pain in right foot: Secondary | ICD-10-CM | POA: Diagnosis not present

## 2016-04-10 DIAGNOSIS — M79671 Pain in right foot: Secondary | ICD-10-CM | POA: Diagnosis not present

## 2016-04-13 DIAGNOSIS — M79671 Pain in right foot: Secondary | ICD-10-CM | POA: Diagnosis not present

## 2016-04-16 DIAGNOSIS — H9313 Tinnitus, bilateral: Secondary | ICD-10-CM | POA: Diagnosis not present

## 2016-04-16 DIAGNOSIS — H8143 Vertigo of central origin, bilateral: Secondary | ICD-10-CM | POA: Diagnosis not present

## 2016-04-25 DIAGNOSIS — R42 Dizziness and giddiness: Secondary | ICD-10-CM | POA: Diagnosis not present

## 2016-04-25 DIAGNOSIS — H9319 Tinnitus, unspecified ear: Secondary | ICD-10-CM | POA: Diagnosis not present

## 2016-05-21 ENCOUNTER — Other Ambulatory Visit: Payer: Self-pay

## 2016-05-21 ENCOUNTER — Telehealth: Payer: Self-pay | Admitting: Internal Medicine

## 2016-05-21 DIAGNOSIS — R1013 Epigastric pain: Secondary | ICD-10-CM

## 2016-05-21 NOTE — Telephone Encounter (Signed)
Yes please proceed to HIDA scan with CCK to evaluate epigastric abdominal pain. office follow-up thereafter

## 2016-05-21 NOTE — Telephone Encounter (Signed)
HIDA scan scheduled at [facility] 05/30/16@7 :30am, pt to arrive there at 7:15am. Pt to be NPO after midnight and hold stomach meds. Pt scheduled to see Dr. Hilarie Fredrickson 07/24/16@10 :30am. Pt aware of appts.

## 2016-05-21 NOTE — Telephone Encounter (Signed)
Pt states she is still having issues with epigastric pain and is ready to have her gallbladder looked at. See EGD report from 12/28/15 regarding CCK Hida scan. Do you want to order HIDA scan now? Please advise.

## 2016-05-30 ENCOUNTER — Ambulatory Visit (HOSPITAL_COMMUNITY)
Admission: RE | Admit: 2016-05-30 | Discharge: 2016-05-30 | Disposition: A | Discharge status: Home / Self Care | Payer: BLUE CROSS/BLUE SHIELD | Source: Ambulatory Visit | Attending: Internal Medicine | Admitting: Internal Medicine

## 2016-05-30 DIAGNOSIS — R1013 Epigastric pain: Secondary | ICD-10-CM

## 2016-05-30 MED ORDER — TECHNETIUM TC 99M MEBROFENIN IV KIT: 5.1000 | PACK | Freq: Once | INTRAVENOUS | Status: DC | PRN

## 2016-06-06 DIAGNOSIS — M79671 Pain in right foot: Secondary | ICD-10-CM | POA: Diagnosis not present

## 2016-06-11 ENCOUNTER — Encounter: Payer: Self-pay | Admitting: Podiatry

## 2016-06-11 ENCOUNTER — Ambulatory Visit (INDEPENDENT_AMBULATORY_CARE_PROVIDER_SITE_OTHER): Payer: BLUE CROSS/BLUE SHIELD | Admitting: Podiatry

## 2016-06-11 VITALS — Resp 16 | Ht 64.5 in | Wt 236.0 lb

## 2016-06-11 DIAGNOSIS — M779 Enthesopathy, unspecified: Secondary | ICD-10-CM

## 2016-06-11 DIAGNOSIS — L84 Corns and callosities: Secondary | ICD-10-CM | POA: Diagnosis not present

## 2016-06-11 MED ORDER — TRIAMCINOLONE ACETONIDE 10 MG/ML IJ SUSP
10.0000 mg | Freq: Once | INTRAMUSCULAR | Status: AC
  Administered 2016-06-11: 10 mg

## 2016-06-11 NOTE — Progress Notes (Signed)
   Subjective:    Patient ID: Carolyn Mcguire, female    DOB: [birth year], 60 y.o.   MRN: [id number]  HPI Chief Complaint  Patient presents with  . Painful lesions    Bilateral; plantar forefoot-below 5th toe; x3 months      Review of Systems  All other systems reviewed and are negative.      Objective:   Physical Exam        Assessment & Plan:

## 2016-06-13 ENCOUNTER — Telehealth: Payer: Self-pay | Admitting: *Deleted

## 2016-06-13 NOTE — Progress Notes (Signed)
Subjective:     Patient ID: Carolyn Mcguire, female   DOB: 07-Jun-1956, 60 y.o.   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

## 2016-06-13 NOTE — Telephone Encounter (Signed)
Pt called for the name of the blocked sweat glands on the bottom of her feet.  I told pt porokeratosis and spelled for pt.

## 2016-06-20 ENCOUNTER — Telehealth: Payer: Self-pay | Admitting: Cardiovascular Disease

## 2016-06-20 NOTE — Telephone Encounter (Signed)
New message   Pt verbalized that she don't feel good she gets hot and feels flushed but she don't know if it is her hormones  Pt said she is off balance   Pt verbalized that she has not vomited or passed out but feels she might

## 2016-06-20 NOTE — Telephone Encounter (Signed)
Spoke with patient who states she has been feeling off balance recently. States she has hx of non-positional vertigo but this does not feel like that - states felt room spinning with previous vertigo and this is not the same.  Complains of getting hot, turning red, feeling nauseated and generally feels bad randomly - sometimes with activity and sometimes not.  States she sometimes gets winded with strenuous activity but continues to walk without difficulty.  States BP and pulse have been normal.  Reports: 7/23 BP 113/75 mmHg and pulse 86 bpm today @ 1445 134/62mmHg, 74 bpm She takes 320 mg valsartan daily She denies chest pain.  Planning to meet with a nutritionist Aug. 10 to work on diet and weight loss. She admits to eating more ice cream recently.  She requests to see Dr. Acie Fredrickson or have an event monitor put on. We discussed past hx of dehydration from lab work in October and she states she probably does not drink enough water.  I advised her to drink at least 64 oz water daily and to also drink low sugar gatorade or V8 for additional electrolytes.  I advised her to push fluids and to avoid high sodium foods over the next 2 days and to call back Friday if symptoms worsen or do not improve.  I advised that Dr. Acie Fredrickson will not be in the office this week or next but that if symptoms worsen I can schedule her with an APP.  She verbalized understanding and agreement.

## 2016-06-21 NOTE — Telephone Encounter (Signed)
Agree with notes from  Christen Bame, RN. Her HR and BP are normal. I would not expect her symptoms to be coming from her Valsartan

## 2016-06-22 ENCOUNTER — Telehealth: Payer: Self-pay | Admitting: Cardiovascular Disease

## 2016-06-22 DIAGNOSIS — Z01419 Encounter for gynecological examination (general) (routine) without abnormal findings: Secondary | ICD-10-CM | POA: Diagnosis not present

## 2016-06-22 DIAGNOSIS — Z6839 Body mass index (BMI) 39.0-39.9, adult: Secondary | ICD-10-CM | POA: Diagnosis not present

## 2016-06-22 DIAGNOSIS — Z8041 Family history of malignant neoplasm of ovary: Secondary | ICD-10-CM | POA: Diagnosis not present

## 2016-06-22 DIAGNOSIS — Z1321 Encounter for screening for nutritional disorder: Secondary | ICD-10-CM | POA: Diagnosis not present

## 2016-06-22 NOTE — Telephone Encounter (Signed)
Spoke with patient who states she is working on drinking water as well as low sodium V8 and occasional gatorade to help with feeling off balance.  She is at her church walking for exercise while we are on the phone.  She states she thinks it is too early to tell if this regimen is helpful but will continue on treatment plan and call back with worsening symptoms.  She thanked me for the call.

## 2016-06-22 NOTE — Telephone Encounter (Signed)
New message    Pt states that she can not get all the fluids in. Please advise.

## 2016-06-28 DIAGNOSIS — Z1231 Encounter for screening mammogram for malignant neoplasm of breast: Secondary | ICD-10-CM | POA: Diagnosis not present

## 2016-06-28 DIAGNOSIS — Z780 Asymptomatic menopausal state: Secondary | ICD-10-CM | POA: Diagnosis not present

## 2016-07-04 DIAGNOSIS — E669 Obesity, unspecified: Secondary | ICD-10-CM | POA: Diagnosis not present

## 2016-07-05 ENCOUNTER — Encounter: Payer: Self-pay | Admitting: Podiatry

## 2016-07-05 ENCOUNTER — Ambulatory Visit (INDEPENDENT_AMBULATORY_CARE_PROVIDER_SITE_OTHER): Payer: BLUE CROSS/BLUE SHIELD

## 2016-07-05 ENCOUNTER — Ambulatory Visit (INDEPENDENT_AMBULATORY_CARE_PROVIDER_SITE_OTHER): Payer: BLUE CROSS/BLUE SHIELD | Admitting: Podiatry

## 2016-07-05 VITALS — BP 134/69 | HR 72 | Resp 12

## 2016-07-05 DIAGNOSIS — M779 Enthesopathy, unspecified: Secondary | ICD-10-CM | POA: Diagnosis not present

## 2016-07-05 DIAGNOSIS — M79671 Pain in right foot: Secondary | ICD-10-CM

## 2016-07-05 DIAGNOSIS — Q828 Other specified congenital malformations of skin: Secondary | ICD-10-CM | POA: Diagnosis not present

## 2016-07-05 DIAGNOSIS — M79672 Pain in left foot: Secondary | ICD-10-CM

## 2016-07-06 ENCOUNTER — Encounter: Payer: Self-pay | Admitting: *Deleted

## 2016-07-06 ENCOUNTER — Telehealth: Payer: Self-pay | Admitting: Genetic Counselor

## 2016-07-06 ENCOUNTER — Encounter: Payer: Self-pay | Admitting: Genetic Counselor

## 2016-07-06 NOTE — Telephone Encounter (Signed)
Appointment scheduled with Roma Kayser on 9/18 at 1pm. Patient aware to arrive 30 minutes. Letter mailed to patient and faxed to the patient. Demographics verified.

## 2016-07-06 NOTE — Progress Notes (Signed)
Subjective:     Patient ID: Carolyn Mcguire, female   DOB: [birth year], 58 y.o.   MRN: [id number]  HPI patient presents with painful lesions on both feet that become chronically sore   Review of Systems     Objective:   Physical Exam Neurovascular status with pain around the fifth metatarsal head of both feet that did not respond as well as I did help with medication trimming    Assessment:     Concerned about lesions bilateral with thought that long-term this may require surgical intervention    Plan:     Debridement accomplished today and instructed on what to do if symptoms should persist. We may need to do either metatarsal head resections her osteotomies if symptoms persist

## 2016-07-16 DIAGNOSIS — H5203 Hypermetropia, bilateral: Secondary | ICD-10-CM | POA: Diagnosis not present

## 2016-07-16 DIAGNOSIS — H04123 Dry eye syndrome of bilateral lacrimal glands: Secondary | ICD-10-CM | POA: Diagnosis not present

## 2016-07-24 ENCOUNTER — Encounter: Payer: Self-pay | Admitting: Internal Medicine

## 2016-07-24 ENCOUNTER — Ambulatory Visit (INDEPENDENT_AMBULATORY_CARE_PROVIDER_SITE_OTHER): Payer: BLUE CROSS/BLUE SHIELD | Admitting: Internal Medicine

## 2016-07-24 VITALS — BP 120/72 | HR 78 | Ht 64.5 in | Wt 234.4 lb

## 2016-07-24 DIAGNOSIS — R1013 Epigastric pain: Secondary | ICD-10-CM | POA: Diagnosis not present

## 2016-07-24 DIAGNOSIS — K219 Gastro-esophageal reflux disease without esophagitis: Secondary | ICD-10-CM

## 2016-07-24 NOTE — Progress Notes (Signed)
Subjective:    Patient ID: Carolyn Mcguire, female    DOB: 1956-03-29, 60 y.o.   MRN: [id number]  HPI Carolyn Mcguire is a 60 year old female with a past medical history of adenomatous colon polyps, epigastric abdominal pain who is here for follow-up. She was last seen in the office in December 2016 by Nicoletta Ba, PA-C. She came for upper endoscopy on 12/28/2015 which revealed nodular mucosa in the prepyloric stomach which was biopsied. The stomach otherwise appeared normal. There was a small 3 mm duodenal bulb polyp which was removed. Biopsies showed peptic duodenitis from the duodenal bulb biopsy. Reactive gastropathy in the antrum and prepyloric stomach without H. pylori or metaplasia. More proximal gastric biopsies were normal. She was treated with Nexium for that this epigastric pain and this made no difference. The pain had persisted and so she underwent HIDA scan on 05/30/2016 which was normal.  She reports that overall she is feeling better. She's been meeting with the nutritionist and has increased her water intake. She is performing calorie count and sticking to 1500 cal daily. She is avoid eating out in her epigastric pain has improved dramatically. Her weight has been stable and she is also exercising 5 days per week. Occasionally she'll have heartburn and she uses over-the-counter Pepcid or Zantac. She denies dysphagia or odynophagia. Bowel movements have been normal without blood or melena.  Review of Systems As per history of present illness, otherwise negative  Current Medications, Allergies, Past Medical History, Past Surgical History, Family History and Social History were reviewed in [employer] record.     Objective:   Physical Exam BP 120/72 (BP Location: Left Arm, Patient Position: Sitting, Cuff Size: Normal)   Pulse 78   Ht 5' 4.5" (1.638 m)   Wt 234 lb 6.4 oz (106.3 kg)   BMI 39.61 kg/m  Constitutional: Well-developed and well-nourished. No  distress. HEENT: Normocephalic and atraumatic. Oropharynx is clear and moist.  No scleral icterus. Neck: Neck supple. Trachea midline. Cardiovascular: Normal rate, regular rhythm and intact distal pulses. No M/R/G Pulmonary/chest: Effort normal and breath sounds normal. No wheezing, rales or rhonchi. Abdominal: Soft, nontender, nondistended. Bowel sounds active throughout.  Extremities: no clubbing, cyanosis, or edema Neurological: Alert and oriented to person place and time. Skin: Skin is warm and dry.  Psychiatric: Normal mood and affect. Behavior is normal.  EGD, Abd [place] and HIDA reviewed including with the patient today  CBC    Component Value Date/Time   WBC 8.5 09/16/2015 1026   RBC 4.78 09/16/2015 1026   HGB 14.4 09/16/2015 1026   HCT 40.1 09/16/2015 1026   PLT 213 09/16/2015 1026   MCV 83.9 09/16/2015 1026   MCH 30.1 09/16/2015 1026   MCHC 35.9 09/16/2015 1026   RDW 13.1 09/16/2015 1026   LYMPHSABS 1.2 09/16/2015 1026   MONOABS 0.4 09/16/2015 1026   EOSABS 0.0 09/16/2015 1026   BASOSABS 0.0 09/16/2015 1026   CMP     Component Value Date/Time   NA 138 09/22/2015 1109   K 4.0 09/22/2015 1109   CL 101 09/22/2015 1109   CO2 24 09/22/2015 1109   GLUCOSE 120 (H) 09/22/2015 1109   BUN 36 (H) 09/22/2015 1109   CREATININE 1.08 (H) 09/22/2015 1109   CALCIUM 9.7 09/22/2015 1109   PROT 7.2 09/16/2015 1026   ALBUMIN 4.4 09/16/2015 1026   AST 18 09/16/2015 1026   ALT 15 09/16/2015 1026   ALKPHOS 85 09/16/2015 1026   BILITOT 0.9 09/16/2015  1026   GFRNONAA >60 07/07/2015 1544   GFRAA >60 07/07/2015 1544       Assessment & Plan:   60 year old female with a past medical history of adenomatous colon polyps, epigastric abdominal pain who is here for follow-up.  1. Epigastric pain -- Unclear etiology, query mild dyspepsia. Symptom has improved with dietary modification and exercise. I recommended she continue both. She can use Zantac or Pepcid her box instruction  over-the-counter on an as-needed basis. She is asked to notify me if pain returns or worsens. She voices understanding  2. GERD -- mild and intermittent. Weight loss and exercise should help. Over-the-counter Zantac or Pepcid per box instruction as needed. No history of Barrett's esophagus.  3. History of adenomatous colon polyps -- due repeat colonoscopy in summer of 2018, recall in place in our system

## 2016-07-24 NOTE — Patient Instructions (Signed)
Please purchase the following medications over the counter and take as directed: Pepcid OR Zantac twice daily x 2-3 weeks or with epigastric abdominal pain/swallowing problems, otherwise, as needed.  Continue diet modification and weight reduction, increased activity.  Follow up as needed with Dr Hilarie Fredrickson.  If you are age 58 or older, your body mass index should be between 23-30. Your Body mass index is 39.61 kg/m. If this is out of the aforementioned range listed, please consider follow up with your Primary Care Provider.  If you are age 65 or younger, your body mass index should be between 19-25. Your Body mass index is 39.61 kg/m. If this is out of the aformentioned range listed, please consider follow up with your Primary Care Provider.

## 2016-07-31 ENCOUNTER — Encounter: Payer: Self-pay | Admitting: Cardiovascular Disease

## 2016-08-01 DIAGNOSIS — H8143 Vertigo of central origin, bilateral: Secondary | ICD-10-CM | POA: Diagnosis not present

## 2016-08-01 DIAGNOSIS — H6901 Patulous Eustachian tube, right ear: Secondary | ICD-10-CM | POA: Diagnosis not present

## 2016-08-01 DIAGNOSIS — H9313 Tinnitus, bilateral: Secondary | ICD-10-CM | POA: Diagnosis not present

## 2016-08-02 ENCOUNTER — Ambulatory Visit (INDEPENDENT_AMBULATORY_CARE_PROVIDER_SITE_OTHER): Payer: BLUE CROSS/BLUE SHIELD | Admitting: Cardiovascular Disease

## 2016-08-02 ENCOUNTER — Encounter (INDEPENDENT_AMBULATORY_CARE_PROVIDER_SITE_OTHER): Payer: Self-pay

## 2016-08-02 ENCOUNTER — Encounter: Payer: Self-pay | Admitting: Cardiovascular Disease

## 2016-08-02 VITALS — BP 132/90 | HR 67 | Ht 64.5 in | Wt 236.0 lb

## 2016-08-02 DIAGNOSIS — I447 Left bundle-branch block, unspecified: Secondary | ICD-10-CM

## 2016-08-02 DIAGNOSIS — I10 Essential (primary) hypertension: Secondary | ICD-10-CM | POA: Diagnosis not present

## 2016-08-02 LAB — BASIC METABOLIC PANEL
BUN: 18 mg/dL (ref 7–25)
CALCIUM: 9.2 mg/dL (ref 8.6–10.4)
CHLORIDE: 105 mmol/L (ref 98–110)
CO2: 28 mmol/L (ref 20–31)
Creat: 0.84 mg/dL (ref 0.50–1.05)
GLUCOSE: 89 mg/dL (ref 65–99)
POTASSIUM: 4.2 mmol/L (ref 3.5–5.3)
SODIUM: 141 mmol/L (ref 135–146)

## 2016-08-02 NOTE — Patient Instructions (Signed)
Medication Instructions:  Your physician recommends that you continue on your current medications as directed. Please refer to the Current Medication list given to you today.   Labwork: TODAY - basic metabolic panel   Testing/Procedures: None Ordered   Follow-Up: Your physician wants you to follow-up in: 1 year with Dr. Nahser. You will receive a reminder letter in the mail two months in advance. If you don't receive a letter, please call our office to schedule the follow-up appointment.   If you need a refill on your cardiac medications before your next appointment, please call your pharmacy.   Thank you for choosing [facility]! Michelle Swinyer, RN [phone]    

## 2016-08-02 NOTE — Progress Notes (Signed)
Carolyn Mcguire Date of Birth  July 10, 1956 [facility] [id number]    [place], [place]  [address] [phone]  Fax  [phone]  Problem list: 1. Left bundle branch block 2. Hypothyroidism 3. Hyperlipidemia 4. Hypertension 5. Dyspnea on exertion   History of Present Illness:  60 yo female with a hx of LBBB.  Stress myoview was negative.  She's been on a diet and exercise program and has lost about 6 pounds since last year.  She is drinking more water, avoiding fast foods.  She is walking regularly.  She has been diagnosed with migraines.  She thinks muscle tension may be contributing to her headaches.  Oct. 5, 2016:  Carolyn Mcguire is seen today for follow up of her shortness of breath Has had many deaths in her family and became depressed . Was started on Welbutrin for depression .  She was seen in the ER. Was told by the pharmacist at her primary medical doctors office thought it was due to the discontinuation of her Welbutrin. She is still not feeling 100% better so she was scheduled to see me .  She is still short of breath.    DOE  With walking up from the buildings in the back of her property.  She does not getting any regular exercise. She was bitten by a fox on July 11.  Had a series of rabies shots ( the fox was rabid)  Is retired .   She has had a stressful 2 years.    Several deaths in her family . Has not had the time or inclination  Wants to start a walking program.     No CP   February 21, 2016: Doing well Has lost some weight .   Has lost 9 lbs since I last saw her  Walks regularly   Sept. 7, 2017: Doing well BP is doing well at home. Still walking some  Has been to a nutritionist.     Current Outpatient Prescriptions on File Prior to Visit  Medication Sig Dispense Refill  . ALPRAZolam (XANAX) 0.5 MG tablet TK 1 T PO  TID PRN  0  . levothyroxine (SYNTHROID, LEVOTHROID) 88 MCG tablet Take 88 mcg by mouth daily before breakfast.    .  metoprolol succinate (TOPROL XL) 25 MG 24 hr tablet Take 1 tablet (25 mg total) by mouth daily. 90 tablet 3  . rosuvastatin (CRESTOR) 10 MG tablet Take 10 mg by mouth daily.    . valsartan (DIOVAN) 320 MG tablet Take 320 mg by mouth daily.  0  . Probiotic Product (RA PROBIOTIC GUMMIES PO) Take by mouth. 2 gummies a day     No current facility-administered medications on file prior to visit.     Allergies  Allergen Reactions  . Sulfur Rash    MAKES PT RED    Past Medical History:  Diagnosis Date  . Adenomatous colon polyp   . Allergic rhinitis   . Animal bite 05/2015   fox   . Anxiety   . Arthritis    shoulder  . Carpal tunnel syndrome    RIGHT HAND  . Depression   . Diastolic dysfunction    a. 2D echo 09/08/15: EF 55-60%, grade 1 DD, elevated LVEDP, mild MR, mild TR. - normal BNP 08/2015.  Marland Kitchen Essential hypertension   . GERD (gastroesophageal reflux disease)   . Heart murmur   . Hepatic steatosis   . Hyperlipidemia   . Hypothyroidism   .  LBBB (left bundle branch block)    a. negative nuclear stress test 06/2011.  . Migraines   . Obesity   . OSA (obstructive sleep apnea)    mild    Past Surgical History:  Procedure Laterality Date  . DERMOID CYST  EXCISION     OF HER RIGHT OVARY. SIZE OF A GRAPEFRUIT. OVARY IS STILL THERE.  Marland Kitchen HEMITHYROIDECTOMY    . RUPTURE BELLY BUTTON     WHEN SHE WAS AN INFANT  . TONSILECTOMY, ADENOIDECTOMY, BILATERAL MYRINGOTOMY AND TUBES     AGE 27, pt states she didn't have tubes put into her ears    History  Smoking Status  . Never Smoker  Smokeless Tobacco  . Never Used    History  Alcohol Use No    Family History  Problem Relation Age of Onset  . Ovarian cancer Mother   . Diabetes Mother   . Heart disease Father   . Colon cancer Maternal Uncle   . Ovarian cancer Maternal Grandmother   . Esophageal cancer Neg Hx   . Rectal cancer Neg Hx   . Stomach cancer Neg Hx     Reviw of Systems:  Reviewed in the HPI.  All other  systems are negative.  Physical Exam: BP 132/90 (BP Location: Left Arm, Patient Position: Sitting, Cuff Size: Large)   Pulse 67   Ht 5' 4.5" (1.638 m)   Wt 236 lb (107 kg)   BMI 39.88 kg/m  The patient is alert and oriented x 3.  The mood and affect are normal.   Skin: warm and dry.  Color is normal.   HEENT:   Normal carotids, no JVD Lungs: clear  Heart: RR ,  Very soft systolic murmur .  Abdomen: normal BS, non tender Extremities:  No edema Neuro:  nonfocal    ECG: Sept.  7, 2017:  NSR at 67.   LBBB  Assessment / Plan:   1. Left bundle branch block- will check echo to make sure her LV EF is still normal.   I doubt that her DOE is due to LBBB.  She is walking more.     2. Hypothyroidism  3. Hyperlipidemia 4. Hypertension - continue valsartan and toprol   5. Dyspnea on exertion-  This is gradually improving .    Mertie Moores, MD  08/02/2016 11:04 AM    [address] [place],  [place], [place]  [address] Pager [phone] Phone: [phone]; Fax: [phone]

## 2016-08-07 ENCOUNTER — Telehealth: Payer: Self-pay | Admitting: Genetic Counselor

## 2016-08-07 NOTE — Telephone Encounter (Signed)
Pt returned call and was able to reschedule pt to 9/19 at 11 am

## 2016-08-07 NOTE — Telephone Encounter (Signed)
Lt mess regarding rescheduling appt.

## 2016-08-13 ENCOUNTER — Encounter: Payer: BLUE CROSS/BLUE SHIELD | Admitting: Genetic Counselor

## 2016-08-13 ENCOUNTER — Other Ambulatory Visit: Payer: BLUE CROSS/BLUE SHIELD

## 2016-08-14 ENCOUNTER — Ambulatory Visit (HOSPITAL_BASED_OUTPATIENT_CLINIC_OR_DEPARTMENT_OTHER): Payer: BLUE CROSS/BLUE SHIELD | Admitting: Genetic Counselor

## 2016-08-14 ENCOUNTER — Other Ambulatory Visit: Payer: BLUE CROSS/BLUE SHIELD

## 2016-08-14 ENCOUNTER — Encounter: Payer: Self-pay | Admitting: Genetic Counselor

## 2016-08-14 DIAGNOSIS — Z800 Family history of malignant neoplasm of digestive organs: Secondary | ICD-10-CM

## 2016-08-14 DIAGNOSIS — Z8041 Family history of malignant neoplasm of ovary: Secondary | ICD-10-CM

## 2016-08-14 DIAGNOSIS — Z808 Family history of malignant neoplasm of other organs or systems: Secondary | ICD-10-CM

## 2016-08-14 DIAGNOSIS — Z809 Family history of malignant neoplasm, unspecified: Secondary | ICD-10-CM

## 2016-08-14 DIAGNOSIS — Z8052 Family history of malignant neoplasm of bladder: Secondary | ICD-10-CM

## 2016-08-14 NOTE — Progress Notes (Addendum)
REFERRING PROVIDER: Avon Gully, NP  PRIMARY PROVIDER:  Horatio Pel, MD  PRIMARY REASON FOR VISIT:  1. Family history of ovarian cancer   2. Family history of pancreatic cancer   3. Family history of colon cancer   4. Family history of bladder cancer   5. Family history of skin cancer   6. Family history of cancer      HISTORY OF PRESENT ILLNESS:   Ms. Biasi, a 60 y.o. female, was seen for a [place] cancer genetics consultation at the request of Avon Gully, NP due to a family history of ovarian and other cancers.  Ms. Corona presents to clinic today to discuss the possibility of a hereditary predisposition to cancer, genetic testing, and to further clarify her future cancer risks, as well as potential cancer risks for family members.    Ms. Wandel is a 60 y.o. female with no personal history of cancer.     HORMONAL RISK FACTORS:  Menarche was at age 50.  First live birth at age - no children.  OCP use for approximately 25-30 years (on and off). Ovaries intact: yes.  Hysterectomy: no.  Menopausal status: postmenopausal since age 42. HRT use: 0 years. Colonoscopy: yes; 04/2012 - three polyps found (1 tubular adenoma and 2 benign lymphoid polyps); is on a 5-year schedule. Mammogram within the last year: yes. Number of breast biopsies: 0. Up to date with pelvic exams:  yes. Any excessive radiation exposure/other exposures in the past:  no  Past Medical History:  Diagnosis Date  . Adenomatous colon polyp   . Allergic rhinitis   . Animal bite 05/2015   fox   . Anxiety   . Arthritis    shoulder  . Carpal tunnel syndrome    RIGHT HAND  . Depression   . Diastolic dysfunction    a. 2D echo 09/08/15: EF 55-60%, grade 1 DD, elevated LVEDP, mild MR, mild TR. - normal BNP 08/2015.  Marland Kitchen Essential hypertension   . GERD (gastroesophageal reflux disease)   . Heart murmur   . Hepatic steatosis   . Hyperlipidemia   . Hypothyroidism   . LBBB (left bundle branch  block)    a. negative nuclear stress test 06/2011.  . Migraines   . Obesity   . OSA (obstructive sleep apnea)    mild    Past Surgical History:  Procedure Laterality Date  . DERMOID CYST  EXCISION     OF HER RIGHT OVARY. SIZE OF A GRAPEFRUIT. OVARY IS STILL THERE.  Marland Kitchen HEMITHYROIDECTOMY    . RUPTURE BELLY BUTTON     WHEN SHE WAS AN INFANT  . TONSILECTOMY, ADENOIDECTOMY, BILATERAL MYRINGOTOMY AND TUBES     AGE 23, pt states she didn't have tubes put into her ears    Social History   Social History  . Marital status: Single    Spouse name: N/A  . Number of children: 0  . Years of education: N/A   Occupational History  . Unemployed    Social History Main Topics  . Smoking status: Never Smoker  . Smokeless tobacco: Never Used  . Alcohol use No  . Drug use: No  . Sexual activity: Not Asked   Other Topics Concern  . None   Social History Narrative  . None     FAMILY HISTORY:  We obtained a detailed, 4-generation family history.  Significant diagnoses are listed below: Family History  Problem Relation Age of Onset  . Ovarian cancer Mother  dx. 57-59; TAH-BSO at 11  . Diabetes Mother   . Pancreatic cancer Mother 54    d. 48  . Skin cancer Mother     maybe basal cell carcinoma  . Heart disease Father   . Bladder Cancer Father     dx 78-81  . Skin cancer Father     possibly melanoma; +sun exposure  . Cancer Brother 39    salivary gland cancer; d. 7; no tobacco use  . Colon cancer Maternal Uncle     unspecified age; lim info  . Ovarian cancer Maternal Grandmother     d. 72-73; w/ mets  . Cancer Paternal Uncle     NOS type; lim info  . Heart attack Maternal Grandfather     d. 68-69  . Heart attack Maternal Uncle   . Esophageal cancer Neg Hx   . Rectal cancer Neg Hx   . Stomach cancer Neg Hx     Ms. Tonkinson has one full brother and he passed away from salivary gland cancer at 36, after being diagnosed the year before.  He has two sons and one daughter,  all of whom are cancer-free.  Ms. Cupples mother was diagnosed with ovarian cancer at age 66-59 and underwent a "hysterectomy" at age 28.  She was diagnosed with pancreatic cancer at 7 and passed away around that time.  She also had a history of skin cancer, possibly of basal cell carcinoma type.  She was never a smoker.  Ms. Dovidio father died of congestive heart failure at 65.  He was a former smoker and former smokeless tobacco user and he was diagnosed with bladder cancer at age 73-81.  He also had a history of multiple skin cancers, Ms. Ornstein believes that at least one of these was a melanoma.  She does report that her father was in the sun a lot.  Ms. Trego mother had three full sisters and two full brothers.  Only one sister is still living--she is 4 and has not had cancer.  One uncle died of a heart attack at an unspecified age.  Another uncle had colon cancer at an unspecified age.  Ms. Budde has no further information for these relatives or for her maternal first cousins.  Her maternal grandmother was diagnosed with ovarian cancer at 39-73.  This cancer metastasized and she passed away shortly after.  Ms. Fudala maternal grandfather died of a heart attack at age 35-69.  She has no information for any maternal great aunts/uncles or great grandparents.  Ms. Saad father had at least one brother, for whom Ms. Meyering had some information for.  He died of an unspecified type of cancer at an unspecified age.  He had two daughters, neither of whom have had any known cancers.  Ms. Crossley has no further information for her paternal relatives, including her paternal grandparents.  Ms. Diamond reports no known family history of genetic testing for hereditary cancer.  Patient's maternal and paternal ancestors are of Caucasian descent. There is no reported Ashkenazi Jewish ancestry. There is no known consanguinity.  GENETIC COUNSELING ASSESSMENT: LACI FRENKEL is a 60 y.o. female with a family  history of ovarian cancer which is somewhat suggestive of a hereditary ovarian cancer syndrome and predisposition to cancer. We, therefore, discussed and recommended the following at today's visit.   DISCUSSION: We reviewed the characteristics, features and inheritance patterns of hereditary cancer syndromes, particularly those caused by mutations within the BRCA1/2 and Lynch syndrome genes. We also  discussed genetic testing, including the appropriate family members to test, the process of testing, insurance coverage and turn-around-time for results. We discussed the implications of a negative, positive and/or variant of uncertain significant result. We recommended Ms. Ham pursue genetic testing for the 20-gene Breast/Ovarian Cancer Panel with MSH2 Exons 1-7 Inversion Analysis through [facility].  The Breast/Ovarian Cancer Panel offered by [facility] Hope Pigeon, MD) includes sequencing and deletion/duplication analysis for the following 19 genes: ATM, BARD1, BRCA1, BRCA2, BRIP1, CDH1, CHEK2, FANCC, MLH1, MSH2, MSH6, NBN, PALB2, PMS2, PTEN, RAD51C, RAD51D, TP53, and XRCC2.  This panel also includes deletion/duplication analysis (without sequencing) for one gene, EPCAM.   Based on Ms. Beebe's family history of cancer, she meets medical criteria for genetic testing. Despite that she meets criteria, she may still have an out of pocket cost. We discussed that if her out of pocket cost for testing is over $100, the laboratory will call and confirm whether she wants to proceed with testing.  If the out of pocket cost of testing is less than $100 she will be billed by the genetic testing laboratory.   PLAN: After considering the risks, benefits, and limitations, Ms. Haliburton  provided informed consent to pursue genetic testing and the blood sample was sent to [facility] for analysis of the 20-gene Breast/Ovarian Cancer Panel with MSH2 Exons 1-7 Inversion Analysis. Results should be  available within approximately 2-3 weeks' time, at which point they will be disclosed by telephone to Ms. Thurner, as will any additional recommendations warranted by these results. Ms. Sheldon will receive a summary of her genetic counseling visit and a copy of her results once available. This information will also be available in Epic. We encouraged Ms. Mcmenamy to remain in contact with cancer genetics annually so that we can continuously update the family history and inform her of any changes in cancer genetics and testing that may be of benefit for her family. Ms. Lau questions were answered to her satisfaction today. Our contact information was provided should additional questions or concerns arise.  Thank you for the referral and allowing [place] to share in the care of your patient.   Jeanine Luz, MS, [facility] Certified Genetic Counselor [email or web address] Phone: [phone]  The patient was seen for a total of 60 minutes in face-to-face genetic counseling.  This patient was discussed with Drs. Magrinat, Lindi Adie and/or Burr Medico who agrees with the above.    _______________________________________________________________________ For Office Staff:  Number of people involved in session: 1 Was an Intern/ student involved with case: no

## 2016-08-15 ENCOUNTER — Telehealth: Payer: Self-pay | Admitting: Genetic Counselor

## 2016-08-15 ENCOUNTER — Encounter: Payer: Self-pay | Admitting: Genetic Counselor

## 2016-08-15 NOTE — Telephone Encounter (Signed)
Called to let me know that her paternal uncle was diagnosed with CLL at 79 and passed away at 2.  She is still hoping to find out more information about the maternal family history of cancer.  She will call me once she learns more.  I have updated her family history with this information.

## 2016-08-20 ENCOUNTER — Encounter: Payer: Self-pay | Admitting: Genetic Counselor

## 2016-08-20 ENCOUNTER — Telehealth: Payer: Self-pay | Admitting: Genetic Counselor

## 2016-08-20 NOTE — Telephone Encounter (Signed)
Carolyn Mcguire called to further update the family history.  I have made these additional changes/additions to her family history in her chart.  We discussed that this is helpful information to have, but that this will not affect the current test order.  She is still searching for some additional information and is welcome to call back with other updates.

## 2016-08-21 DIAGNOSIS — Z8052 Family history of malignant neoplasm of bladder: Secondary | ICD-10-CM | POA: Diagnosis not present

## 2016-08-21 DIAGNOSIS — Z800 Family history of malignant neoplasm of digestive organs: Secondary | ICD-10-CM | POA: Diagnosis not present

## 2016-08-21 DIAGNOSIS — Z808 Family history of malignant neoplasm of other organs or systems: Secondary | ICD-10-CM | POA: Diagnosis not present

## 2016-08-21 DIAGNOSIS — Z8041 Family history of malignant neoplasm of ovary: Secondary | ICD-10-CM | POA: Diagnosis not present

## 2016-08-23 DIAGNOSIS — B070 Plantar wart: Secondary | ICD-10-CM | POA: Diagnosis not present

## 2016-08-23 DIAGNOSIS — L918 Other hypertrophic disorders of the skin: Secondary | ICD-10-CM | POA: Diagnosis not present

## 2016-08-23 DIAGNOSIS — D225 Melanocytic nevi of trunk: Secondary | ICD-10-CM | POA: Diagnosis not present

## 2016-08-23 DIAGNOSIS — L812 Freckles: Secondary | ICD-10-CM | POA: Diagnosis not present

## 2016-08-30 ENCOUNTER — Encounter: Payer: Self-pay | Admitting: Cardiovascular Disease

## 2016-08-30 DIAGNOSIS — E039 Hypothyroidism, unspecified: Secondary | ICD-10-CM | POA: Diagnosis not present

## 2016-08-30 DIAGNOSIS — Z0000 Encounter for general adult medical examination without abnormal findings: Secondary | ICD-10-CM | POA: Diagnosis not present

## 2016-08-31 ENCOUNTER — Telehealth: Payer: Self-pay | Admitting: Genetic Counselor

## 2016-08-31 NOTE — Telephone Encounter (Signed)
Discussed with Carolyn Mcguire that her genetic test results were negative for known pathogenic mutations within any of 20 genes on the Breast/Ovarian Cancer Panel through [facility].  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  Discussed that we treat this just like a negative test result until gets updated by the lab and reviewed why we do that.  Encouraged her to keep her phone number up-to-date with [facility], so that we can call and let her know if this gets updated in the future.  Discussed that we still have not explained her family history of ovarian cancer.  Other maternal relatives and also her niece and nephews can have genetic testing, as there is a chance there could have been a mutation in the family that Carolyn Mcguire herself just did not inherit, but that other relatives may have.  Discussed that she should continue to follow her doctors' recommendations for future cancer screening.  Discussed that she is still at a higher risk for ovarian cancer due to her family history and that she should discuss this further with her provider.  She knows she is welcome to call with any questions.  She would like a copy of her results mailed and I am happy to do that.   

## 2016-09-03 ENCOUNTER — Ambulatory Visit: Payer: Self-pay | Admitting: Genetic Counselor

## 2016-09-03 DIAGNOSIS — Z809 Family history of malignant neoplasm, unspecified: Secondary | ICD-10-CM

## 2016-09-03 DIAGNOSIS — Z800 Family history of malignant neoplasm of digestive organs: Secondary | ICD-10-CM

## 2016-09-03 DIAGNOSIS — Z1379 Encounter for other screening for genetic and chromosomal anomalies: Secondary | ICD-10-CM | POA: Insufficient documentation

## 2016-09-03 DIAGNOSIS — Z8041 Family history of malignant neoplasm of ovary: Secondary | ICD-10-CM

## 2016-09-03 NOTE — Progress Notes (Signed)
GENETIC TEST RESULT  HPI: Ms. Saksa was previously seen in the [place] clinic due to a family history of ovarian, pancreatic, colon, and other cancers and concerns regarding a hereditary predisposition to cancer. Please refer to our prior cancer genetics clinic note from August 14, 2016 for more information regarding Ms. Potocki's medical, social and family histories, and our assessment and recommendations, at the time. Ms. Heinke recent genetic test results were disclosed to her, as were recommendations warranted by these results. These results and recommendations are discussed in more detail below.  GENETIC TEST RESULTS: At the time of Ms. Stockert's visit on 08/14/16, we recommended she pursue genetic testing of the 20-gene Breast/Ovarian Cancer Panel with MSH2 Exons 1-7 Inversion Analysis through [facility].  Those results are now back, the report date for which is August 28, 2016.  Genetic testing was normal, and did not reveal a deleterious mutation in these genes.  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  The test report will be scanned into EP[facility] and will be located under the Results Review tab in the Pathology>Molecular Pathology section.   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 At this time, it is unknown if this VUS is associated with an increased risk for cancer or if this is a normal finding. Since this VUS result is uncertain, it cannot help guide screening recommendations, and family members should not be tested for this VUS to help define their own cancer risks.  Also, we all have variants within our genes that make [place] unique individuals--most of these variants are benign.  Thus, we treat this VUS as a negative result until it gets updated by the lab.   With time, we suspect the lab will reclassify this variant and when they do, we will try to  re-contact Ms. Volcy to discuss the reclassification further.  We also encouraged Ms. Hoying to contact us in a year or two to obtain an update on the status of this VUS.  We discussed with Ms. Nachreiner that since the current genetic testing is not perfect, it is possible there may be a gene mutation in one of these genes that current testing cannot detect, but that chance is small. We also discussed, that it is possible that another gene that has not yet been discovered, or that we have not yet tested, is responsible for the cancer diagnoses in the family, and it is, therefore, important to remain in touch with cancer genetics in the future so that we can continue to offer Ms. Lomas the most up-to-date genetic testing.   CANCER SCREENING RECOMMENDATIONS: Though we still do not have an explanation for the family history of cancer, this normal result is reassuring and indicates that Ms. Glendening does not likely have an increased risk of cancer due to a mutation in one of these genes.  We, therefore, recommended  Ms. Blaschke continue to follow the cancer screening guidelines provided by her primary healthcare providers.  She should discuss the family history of ovarian cancer, in light of this negative genetic test result, with her primary provider.  Further testing of additional family members may be helpful to further understanding of the personal and familial cancer risks.   RECOMMENDATIONS FOR FAMILY MEMBERS: Men and women in this family might be at some increased risk of developing cancer, over the general population risk, simply due to the family history  of cancer. We recommended women in this family have a yearly mammogram beginning at age 56, or 48 years younger than the earliest onset of cancer, an annual clinical breast exam, and perform monthly breast self-exams. Women in this family should also have a gynecological exam as recommended by their primary provider.  Women in the family should make their  primary doctors aware of the family history of ovarian cancer, so that they may continue to receive the most appropriate medical management options.  All family members should have a colonoscopy by age 44.  Based on Ms. Ruppe's family history of ovarian cancer in her mother and maternal grandmother, we recommended her niece and nephews and other maternal relatives, such as cousins, have genetic counseling and testing.  There could be a genetic mutation in the family that explains the family history of cancer and that Ms. Bellows herself just did not inherit.  Ms. Vogan will let us know if we can be of any assistance in coordinating genetic counseling and/or testing for these family members.   FOLLOW-UP: Lastly, we discussed with Ms. Soltero that cancer genetics is a rapidly advancing field and it is possible that new genetic tests will be appropriate for her and/or her family members in the future. We encouraged her to remain in contact with cancer genetics on an annual basis so we can update her personal and family histories and let her know of advances in cancer genetics that may benefit this family.   Our contact number was provided. Ms. Newsome questions were answered to her satisfaction, and she knows she is welcome to call us at anytime with additional questions or concerns.   Jeanine Luz, MS, [facility] Certified Genetic Counselor [email or web address] Phone: [phone]

## 2016-09-04 DIAGNOSIS — Z0000 Encounter for general adult medical examination without abnormal findings: Secondary | ICD-10-CM | POA: Diagnosis not present

## 2016-09-04 DIAGNOSIS — Z23 Encounter for immunization: Secondary | ICD-10-CM | POA: Diagnosis not present

## 2016-09-04 DIAGNOSIS — M722 Plantar fascial fibromatosis: Secondary | ICD-10-CM | POA: Diagnosis not present

## 2016-09-04 DIAGNOSIS — I10 Essential (primary) hypertension: Secondary | ICD-10-CM | POA: Diagnosis not present

## 2016-09-04 DIAGNOSIS — E7800 Pure hypercholesterolemia, unspecified: Secondary | ICD-10-CM | POA: Diagnosis not present

## 2016-09-04 DIAGNOSIS — E039 Hypothyroidism, unspecified: Secondary | ICD-10-CM | POA: Diagnosis not present

## 2016-09-10 ENCOUNTER — Telehealth: Payer: Self-pay | Admitting: Genetic Counselor

## 2016-09-10 NOTE — Telephone Encounter (Signed)
Ms. Otting had called last week and left a message, so I followed up with her today. She wanted to make sure that I would send her a letter that discussed her results and what the uncertain change means.  I discussed that I would and that I would try to get that out to her this week.  We re-reviewed what the uncertain change means, and this makes more sense to her now.

## 2016-09-14 DIAGNOSIS — M25571 Pain in right ankle and joints of right foot: Secondary | ICD-10-CM | POA: Diagnosis not present

## 2016-09-14 DIAGNOSIS — M71571 Other bursitis, not elsewhere classified, right ankle and foot: Secondary | ICD-10-CM | POA: Diagnosis not present

## 2016-09-14 DIAGNOSIS — M71572 Other bursitis, not elsewhere classified, left ankle and foot: Secondary | ICD-10-CM | POA: Diagnosis not present

## 2016-09-14 DIAGNOSIS — M216X1 Other acquired deformities of right foot: Secondary | ICD-10-CM | POA: Diagnosis not present

## 2016-09-14 DIAGNOSIS — M25572 Pain in left ankle and joints of left foot: Secondary | ICD-10-CM | POA: Diagnosis not present

## 2016-09-14 DIAGNOSIS — M216X2 Other acquired deformities of left foot: Secondary | ICD-10-CM | POA: Diagnosis not present

## 2016-09-14 DIAGNOSIS — M722 Plantar fascial fibromatosis: Secondary | ICD-10-CM | POA: Diagnosis not present

## 2016-09-20 DIAGNOSIS — E039 Hypothyroidism, unspecified: Secondary | ICD-10-CM | POA: Diagnosis not present

## 2016-09-20 DIAGNOSIS — L680 Hirsutism: Secondary | ICD-10-CM | POA: Diagnosis not present

## 2016-09-20 DIAGNOSIS — E049 Nontoxic goiter, unspecified: Secondary | ICD-10-CM | POA: Diagnosis not present

## 2016-10-03 ENCOUNTER — Encounter: Payer: Self-pay | Admitting: Genetic Counselor

## 2016-10-03 ENCOUNTER — Telehealth: Payer: Self-pay | Admitting: Genetic Counselor

## 2016-10-03 DIAGNOSIS — M79671 Pain in right foot: Secondary | ICD-10-CM | POA: Diagnosis not present

## 2016-10-03 DIAGNOSIS — M71571 Other bursitis, not elsewhere classified, right ankle and foot: Secondary | ICD-10-CM | POA: Diagnosis not present

## 2016-10-03 DIAGNOSIS — M79672 Pain in left foot: Secondary | ICD-10-CM | POA: Diagnosis not present

## 2016-10-03 DIAGNOSIS — M71572 Other bursitis, not elsewhere classified, left ankle and foot: Secondary | ICD-10-CM | POA: Diagnosis not present

## 2016-10-03 DIAGNOSIS — B351 Tinea unguium: Secondary | ICD-10-CM | POA: Diagnosis not present

## 2016-10-03 DIAGNOSIS — B353 Tinea pedis: Secondary | ICD-10-CM | POA: Diagnosis not present

## 2016-10-03 DIAGNOSIS — B079 Viral wart, unspecified: Secondary | ICD-10-CM | POA: Diagnosis not present

## 2016-10-03 NOTE — Telephone Encounter (Signed)
Carolyn Mcguire had left a message about an insurance document she had receive in the mail, and she had called [facility] about this.  I verified what they told her that she would only be responsible for the price they had called her about previously.  She can essentially ignore the documents that she is getting in the mail from insurance, unless they would send her a check that she needs to pass on to the lab.  I also verified that there is a results letter and copy going out in the mail to her.  She's welcome to call with any questions.

## 2016-10-24 DIAGNOSIS — E669 Obesity, unspecified: Secondary | ICD-10-CM | POA: Diagnosis not present

## 2016-10-25 ENCOUNTER — Telehealth: Payer: Self-pay | Admitting: Cardiovascular Disease

## 2016-10-25 DIAGNOSIS — D235 Other benign neoplasm of skin of trunk: Secondary | ICD-10-CM | POA: Diagnosis not present

## 2016-10-25 DIAGNOSIS — L814 Other melanin hyperpigmentation: Secondary | ICD-10-CM | POA: Diagnosis not present

## 2016-10-25 DIAGNOSIS — L821 Other seborrheic keratosis: Secondary | ICD-10-CM | POA: Diagnosis not present

## 2016-10-25 DIAGNOSIS — D1801 Hemangioma of skin and subcutaneous tissue: Secondary | ICD-10-CM | POA: Diagnosis not present

## 2016-10-25 NOTE — Telephone Encounter (Signed)
Please advise 

## 2016-10-25 NOTE — Telephone Encounter (Signed)
New Message:    Pt wants to know if she can take Vimovo,its an inflammation medicine? She just did not want it to effect the other medicine she is on.Please call back after 1 today.

## 2016-10-25 NOTE — Telephone Encounter (Signed)
Will not interact with her other medications, the esomeprazole component is to protect pt from GI bleed which the naproxen component can increase the risk of. Would advise pt to use on an as needed rather than scheduled basis since chronic use can increase blood pressure.

## 2016-10-25 NOTE — Telephone Encounter (Signed)
I spoke to patient and advised of M. Supple's message. Pt voiced understanding and agreed with plan. She also voiced thanks for the call.

## 2016-11-09 DIAGNOSIS — M79672 Pain in left foot: Secondary | ICD-10-CM | POA: Diagnosis not present

## 2016-11-09 DIAGNOSIS — M79671 Pain in right foot: Secondary | ICD-10-CM | POA: Diagnosis not present

## 2016-11-09 DIAGNOSIS — M722 Plantar fascial fibromatosis: Secondary | ICD-10-CM | POA: Diagnosis not present

## 2016-11-09 DIAGNOSIS — B079 Viral wart, unspecified: Secondary | ICD-10-CM | POA: Diagnosis not present

## 2016-11-13 DIAGNOSIS — M2242 Chondromalacia patellae, left knee: Secondary | ICD-10-CM | POA: Diagnosis not present

## 2016-11-13 DIAGNOSIS — M2241 Chondromalacia patellae, right knee: Secondary | ICD-10-CM | POA: Diagnosis not present

## 2016-11-20 ENCOUNTER — Telehealth: Payer: Self-pay | Admitting: Cardiovascular Disease

## 2016-11-20 NOTE — Telephone Encounter (Signed)
New Message  Pt c/o BP issue: STAT if pt c/o blurred vision, one-sided weakness or slurred speech  1. What are your last 5 BP readings? 170/84  2. Are you having any other symptoms (ex. Dizziness, headache, blurred vision, passed out)? No  3. What is your BP issue? BP higher than normal

## 2016-11-20 NOTE — Telephone Encounter (Signed)
I agree with note from Christen Bame, RN. I suspect the BP is higher because of the increased salt intake recently

## 2016-11-20 NOTE — Telephone Encounter (Signed)
Spoke with patient who states BP is higher than normal over the past 2 days.  Reports BP yesterday was 163/89 mmHg. She states she changed the batteries in her BP cuff and BP improved slightly.  States she fell on Friday 12/22 and asks if that would affect her BP.  She hit on the left side of her body; denies head trauma.  States on 12/21 BP was 123/74 mmHg (prior to the fall). She reports eating out more than usual over the past week.  Reports heart rate 70's to 80's bpm.  I advised her to work on lower sodium diet and to take tylenol as needed for pain.  I advised her to continue to monitor BP and call back to report if it does not return to normal.  She is scheduled for 6 mo follow up in March with Dr. Acie Fredrickson and I advised we can move up her appointment if BP does not improve.  She verbalized understanding and agreement with plan and thanked me for the call.

## 2016-12-07 DIAGNOSIS — M542 Cervicalgia: Secondary | ICD-10-CM | POA: Diagnosis not present

## 2016-12-07 DIAGNOSIS — M4682 Other specified inflammatory spondylopathies, cervical region: Secondary | ICD-10-CM | POA: Diagnosis not present

## 2016-12-07 DIAGNOSIS — M5032 Other cervical disc degeneration, mid-cervical region, unspecified level: Secondary | ICD-10-CM | POA: Diagnosis not present

## 2016-12-21 DIAGNOSIS — M722 Plantar fascial fibromatosis: Secondary | ICD-10-CM | POA: Diagnosis not present

## 2016-12-21 DIAGNOSIS — M71572 Other bursitis, not elsewhere classified, left ankle and foot: Secondary | ICD-10-CM | POA: Diagnosis not present

## 2016-12-21 DIAGNOSIS — M71571 Other bursitis, not elsewhere classified, right ankle and foot: Secondary | ICD-10-CM | POA: Diagnosis not present

## 2017-01-17 DIAGNOSIS — M722 Plantar fascial fibromatosis: Secondary | ICD-10-CM | POA: Diagnosis not present

## 2017-01-17 DIAGNOSIS — M71571 Other bursitis, not elsewhere classified, right ankle and foot: Secondary | ICD-10-CM | POA: Diagnosis not present

## 2017-01-17 DIAGNOSIS — M71572 Other bursitis, not elsewhere classified, left ankle and foot: Secondary | ICD-10-CM | POA: Diagnosis not present

## 2017-01-18 ENCOUNTER — Telehealth: Payer: Self-pay | Admitting: Cardiovascular Disease

## 2017-01-18 NOTE — Telephone Encounter (Signed)
Patient states she was prescribed methylprednisolone dose pack for plantar fascitis and heel spur and wants to make sure it is okay to take since she has hypertension. I advised her that she may take since it is short term treatment. I advised her to avoid other agents that could also increase blood pressure including other anti-inflammatories and caffeine. She has an appointment with Dr. Acie Fredrickson on 3/2 and we can check on her progress at that time. She thanked me for the call.

## 2017-01-18 NOTE — Telephone Encounter (Signed)
New message     Pt is calling-Podiatrist has prescribed prednisone. Pt is calling to ask if the medication will make her blood pressure to high.

## 2017-01-25 ENCOUNTER — Encounter: Payer: Self-pay | Admitting: Cardiovascular Disease

## 2017-01-25 ENCOUNTER — Ambulatory Visit (INDEPENDENT_AMBULATORY_CARE_PROVIDER_SITE_OTHER): Payer: BLUE CROSS/BLUE SHIELD | Admitting: Cardiovascular Disease

## 2017-01-25 VITALS — BP 132/80 | HR 88 | Ht 64.5 in | Wt 243.8 lb

## 2017-01-25 DIAGNOSIS — I447 Left bundle-branch block, unspecified: Secondary | ICD-10-CM

## 2017-01-25 DIAGNOSIS — I10 Essential (primary) hypertension: Secondary | ICD-10-CM

## 2017-01-25 LAB — BASIC METABOLIC PANEL
BUN/Creatinine Ratio: 21 (ref 9–23)
BUN: 20 mg/dL (ref 6–24)
CALCIUM: 9.1 mg/dL (ref 8.7–10.2)
CHLORIDE: 100 mmol/L (ref 96–106)
CO2: 23 mmol/L (ref 18–29)
Creatinine, Ser: 0.97 mg/dL (ref 0.57–1.00)
GFR calc non Af Amer: 65 mL/min/{1.73_m2} (ref >59)
GFR, EST AFRICAN AMERICAN: 74 mL/min/{1.73_m2} (ref >59)
Glucose: 93 mg/dL (ref 65–99)
POTASSIUM: 4.2 mmol/L (ref 3.5–5.2)
Sodium: 142 mmol/L (ref 134–144)

## 2017-01-25 NOTE — Addendum Note (Signed)
Addended by: Emmaline Life on: 01/25/2017 11:44 AM   Modules accepted: Orders

## 2017-01-25 NOTE — Progress Notes (Signed)
Carolyn Mcguire Date of Birth  1956/04/13 [facility] [id number]    [place], [place]  [address] [phone]  Fax  [phone]  Problem list: 1. Left bundle branch block 2. Hypothyroidism 3. Hyperlipidemia 4. Hypertension 5. Dyspnea on exertion   History of Present Illness:  61 yo female with a hx of LBBB.  Stress myoview was negative.  She's been on a diet and exercise program and has lost about 6 pounds since last year.  She is drinking more water, avoiding fast foods.  She is walking regularly.  She has been diagnosed with migraines.  She thinks muscle tension may be contributing to her headaches.  Oct. 5, 2016:  Vernae is seen today for follow up of her shortness of breath Has had many deaths in her family and became depressed . Was started on Welbutrin for depression .  She was seen in the ER. Was told by the pharmacist at her primary medical doctors office thought it was due to the discontinuation of her Welbutrin. She is still not feeling 100% better so she was scheduled to see me .  She is still short of breath.    DOE  With walking up from the buildings in the back of her property.  She does not getting any regular exercise. She was bitten by a fox on July 11.  Had a series of rabies shots ( the fox was rabid)  Is retired .   She has had a stressful 2 years.    Several deaths in her family . Has not had the time or inclination  Wants to start a walking program.     No CP   February 21, 2016: Doing well Has lost some weight .   Has lost 9 lbs since I last saw her  Walks regularly   Sept. 7, 2017: Doing well BP is doing well at home. Still walking some  Has been to a nutritionist.   March 2 , 2108: Rateel is seen today .  Follow up for hypertension and hyper-hyperlipidemia, and hypothyroidism. BP is well controlled.    Current Outpatient Prescriptions on File Prior to Visit  Medication Sig Dispense Refill  . ALPRAZolam (XANAX) 0.5 MG  tablet TK 1 T PO  TID PRN  0  . levothyroxine (SYNTHROID, LEVOTHROID) 88 MCG tablet Take 88 mcg by mouth daily before breakfast.    . metoprolol succinate (TOPROL XL) 25 MG 24 hr tablet Take 1 tablet (25 mg total) by mouth daily. 90 tablet 3  . Probiotic Product (RA PROBIOTIC GUMMIES PO) Take by mouth. 2 gummies a day    . rosuvastatin (CRESTOR) 10 MG tablet Take 10 mg by mouth daily.    . valsartan (DIOVAN) 320 MG tablet Take 320 mg by mouth daily.  0   No current facility-administered medications on file prior to visit.     Allergies  Allergen Reactions  . Sulfur Rash    MAKES PT RED    Past Medical History:  Diagnosis Date  . Adenomatous colon polyp   . Allergic rhinitis   . Animal bite 05/2015   fox   . Anxiety   . Arthritis    shoulder  . Carpal tunnel syndrome    RIGHT HAND  . Depression   . Diastolic dysfunction    a. 2D echo 09/08/15: EF 55-60%, grade 1 DD, elevated LVEDP, mild MR, mild TR. - normal BNP 08/2015.  Marland Kitchen Essential hypertension   . GERD (gastroesophageal  reflux disease)   . Heart murmur   . Hepatic steatosis   . Hyperlipidemia   . Hypothyroidism   . LBBB (left bundle branch block)    a. negative nuclear stress test 06/2011.  . Migraines   . Obesity   . OSA (obstructive sleep apnea)    mild    Past Surgical History:  Procedure Laterality Date  . DERMOID CYST  EXCISION     OF HER RIGHT OVARY. SIZE OF A GRAPEFRUIT. OVARY IS STILL THERE.  Marland Kitchen HEMITHYROIDECTOMY    . RUPTURE BELLY BUTTON     WHEN SHE WAS AN INFANT  . TONSILECTOMY, ADENOIDECTOMY, BILATERAL MYRINGOTOMY AND TUBES     AGE 52, pt states she didn't have tubes put into her ears    History  Smoking Status  . Never Smoker  Smokeless Tobacco  . Never Used    History  Alcohol Use No    Family History  Problem Relation Age of Onset  . Ovarian cancer Mother     dx. 47-59; TAH-BSO at 33  . Diabetes Mother   . Pancreatic cancer Mother 54    d. 29  . Skin cancer Mother     maybe basal  cell carcinoma  . Heart disease Father   . Bladder Cancer Father     dx 22-81  . Skin cancer Father     possibly melanoma; +sun exposure  . Cancer Brother 24    salivary gland cancer; d. 73; no tobacco use  . Colon cancer Maternal Uncle     dx late 75s; d. early 9s  . Ovarian cancer Maternal Grandmother     d. 75; w/ mets  . Breast cancer Maternal Aunt     dx unspecified age; s/p mastectomy  . Other Maternal Aunt     intellectual disabilities  . Cancer Paternal Uncle     CLL, dx. 59  . Heart attack Maternal Grandfather     d. 57  . Heart attack Maternal Uncle 51  . Heart failure Maternal Aunt 86    acute diastolic heart failure  . Esophageal cancer Neg Hx   . Rectal cancer Neg Hx   . Stomach cancer Neg Hx     Reviw of Systems:  Reviewed in the HPI.  All other systems are negative.  Physical Exam: BP 132/80 (BP Location: Right Arm, Patient Position: Sitting, Cuff Size: Large)   Pulse 88   Ht 5' 4.5" (1.638 m)   Wt 243 lb 12.8 oz (110.6 kg)   SpO2 96%   BMI 41.20 kg/m  The patient is alert and oriented x 3.  The mood and affect are normal.   Skin: warm and dry.  Color is normal.   HEENT:   Normal carotids, no JVD Lungs: clear  Heart: RR ,  Very soft systolic murmur .  Abdomen: normal BS, non tender Extremities:  No edema Neuro:  nonfocal    ECG: Sept.  7, 2017:  NSR at 67.   LBBB  Assessment / Plan:   1. Left bundle branch block- will check echo to make sure her LV EF is still normal.   I doubt that her DOE is due to LBBB.  She is walking more.     2. Hypothyroidism  3. Hyperlipidemia 4. Hypertension - continue valsartan and toprol  Needs to work on a better diet and exercise program. Has gained 22 lbs over th past year   Will see her in 1 year.   5.  Dyspnea on exertion-  This is gradually improving .    Mertie Moores, MD  01/25/2017 11:25 AM    [address],  [place]  [address] Pager [phone] Phone: [phone]; Fax: [phone]

## 2017-01-25 NOTE — Patient Instructions (Addendum)
Medication Instructions:  Your physician recommends that you continue on your current medications as directed. Please refer to the Current Medication list given to you today.   Labwork: TODAY - basic metabolic panel   Testing/Procedures: None Ordered   Follow-Up: Your physician wants you to follow-up in: 1 year with Dr. Nahser. You will receive a reminder letter in the mail two months in advance. If you don't receive a letter, please call our office to schedule the follow-up appointment.   If you need a refill on your cardiac medications before your next appointment, please call your pharmacy.   Thank you for choosing [facility]! Garhett Bernhard, RN [phone]    

## 2017-02-11 ENCOUNTER — Ambulatory Visit (INDEPENDENT_AMBULATORY_CARE_PROVIDER_SITE_OTHER): Payer: BLUE CROSS/BLUE SHIELD | Admitting: Physician Assistant

## 2017-02-11 ENCOUNTER — Encounter: Payer: Self-pay | Admitting: Physician Assistant

## 2017-02-11 VITALS — BP 138/76 | HR 76 | Ht 63.5 in | Wt 243.2 lb

## 2017-02-11 DIAGNOSIS — K625 Hemorrhage of anus and rectum: Secondary | ICD-10-CM | POA: Diagnosis not present

## 2017-02-11 DIAGNOSIS — Z86010 Personal history of colonic polyps: Secondary | ICD-10-CM | POA: Diagnosis not present

## 2017-02-11 DIAGNOSIS — K602 Anal fissure, unspecified: Secondary | ICD-10-CM

## 2017-02-11 DIAGNOSIS — R51 Headache: Secondary | ICD-10-CM | POA: Diagnosis not present

## 2017-02-11 DIAGNOSIS — J069 Acute upper respiratory infection, unspecified: Secondary | ICD-10-CM | POA: Diagnosis not present

## 2017-02-11 MED ORDER — NA SULFATE-K SULFATE-MG SULF 17.5-3.13-1.6 GM/177ML PO SOLN: 1.0000 | Freq: Once | ORAL | 0 refills | Status: AC

## 2017-02-11 MED ORDER — DILTIAZEM GEL 2 %: 1.0000 "application " | Freq: Three times a day (TID) | CUTANEOUS | 1 refills | Status: DC

## 2017-02-11 NOTE — Patient Instructions (Signed)
We have sent the following medications to your pharmacy for you to pick up at your convenience:  Diltiazem  You have been scheduled for a colonoscopy. Please follow written instructions given to you at your visit today.  Please pick up your prep supplies at the pharmacy within the next 1-3 days. If you use inhalers (even only as needed), please bring them with you on the day of your procedure. Your physician has requested that you go to www.startemmi.com and enter the access code given to you at your visit today. This web site gives a general overview about your procedure. However, you should still follow specific instructions given to you by our office regarding your preparation for the procedure.   Continue high fiber diet, increase fluids, stool softeners as needed

## 2017-02-11 NOTE — Progress Notes (Addendum)
Subjective:    Patient ID: Carolyn Mcguire, female    DOB: [birth year], 61 y.o.   MRN: [id number]  HPI  Carolyn Mcguire  is a pleasant 61 year old white female known to Dr. Hilarie Fredrickson. She comes in today with concerns about recent rectal bleeding and probable anal fissure. Patient has history of adenomatous colon polyp with last colonoscopy June 2013. She had a 4 mm polyp removed in 2 diminutive polyps removed, there were no internal hemorrhoids noted. Path on the polyps revealed one of them to be a tubular adenoma and she was recommended for 5 year interval follow-up. Patient says she has had problems over the past couple of months intermittently. She feels that she may have had a fissure develop a couple of months ago which mostly healed and then began bothering her again about 2 weeks ago. She has Mcguire seeing small amounts of blood on the tissue on a fairly regular basis but has not seen any over the past couple of days.. She does not recall any severe rectal pain but says she's had some mild burning with bowel movements. She feels that the fissure is probably on the left side. She is Mcguire using Preparation H and stool softeners.  Review of Systems Pertinent positive and negative review of systems were noted in the above HPI section.  All other review of systems was otherwise negative.  Outpatient Encounter Prescriptions as of 02/11/2017  Medication Sig  . ALPRAZolam (XANAX) 0.5 MG tablet TK 1 T PO  TID PRN  . levothyroxine (SYNTHROID, LEVOTHROID) 88 MCG tablet Take 88 mcg by mouth daily before breakfast.  . metoprolol succinate (TOPROL XL) 25 MG 24 hr tablet Take 1 tablet (25 mg total) by mouth daily.  . Multiple Vitamin (MULTIVITAMIN) tablet Take 1 tablet by mouth daily. GUMMIES  . rosuvastatin (CRESTOR) 10 MG tablet Take 10 mg by mouth daily.  Marland Kitchen Specialty Vitamins Products (JOINT/BONE VITALITY PO) Take 2 oz by mouth daily.  . valsartan (DIOVAN) 320 MG tablet Take 320 mg by mouth daily.  Marland Kitchen diltiazem 2 %  GEL Apply 1 application topically 3 (three) times daily. For one month then as needed  . Na Sulfate-K Sulfate-Mg Sulf 17.5-3.13-1.6 GM/180ML SOLN Take 1 kit by mouth once.  . [DISCONTINUED] diltiazem 2 % GEL Apply 1 application topically 3 (three) times daily. For one month then as needed  . [DISCONTINUED] Pediatric Multiple Vit-C-FA (MULTIVITAMIN ANIMAL SHAPES, WITH CA/FA,) with C & FA chewable tablet Chew 2 tablets by mouth daily.   No facility-administered encounter medications on file as of 02/11/2017.    Allergies  Allergen Reactions  . Sulfur Rash    MAKES PT RED   Patient Active Problem List   Diagnosis Date Noted  . Genetic testing 09/03/2016  . Family history of ovarian cancer 08/14/2016  . Porokeratosis 07/05/2016  . Diastolic dysfunction 66/04/44  . Obesity   . Essential hypertension   . Hyperlipidemia   . HTN (hypertension) 04/23/2012  . LBBB (left bundle branch block) 06/15/2011   Social History   Social History  . Marital status: Single    Spouse name: N/A  . Number of children: 0  . Years of education: N/A   Occupational History  . Unemployed    Social History Main Topics  . Smoking status: Never Smoker  . Smokeless tobacco: Never Used  . Alcohol use No  . Drug use: No  . Sexual activity: Not on file   Other Topics Concern  . Not on file  Social History Narrative  . No narrative on file    Carolyn Mcguire's family history includes Bladder Cancer in her father; Breast cancer in her maternal aunt; Cancer in her paternal uncle; Cancer (age of onset: 4) in her brother; Colon cancer in her maternal uncle; Diabetes in her mother; Heart attack in her maternal grandfather; Heart attack (age of onset: 91) in her maternal uncle; Heart disease in her father; Heart failure (age of onset: 72) in her maternal aunt; Other in her maternal aunt; Ovarian cancer in her maternal grandmother and mother; Pancreatic cancer (age of onset: 19) in her mother; Skin cancer in her  father and mother.      Objective:    Vitals:   02/11/17 0832 02/11/17 0903  BP: (!) 142/74 138/76  Pulse:      Physical Exam  well-developed white female in no acute distress, pleasant blood pressure 142/74, pulse 76, height 5 foot 3, weight 243, BMI 42.4. HEENT; nontraumatic normocephalic EOMI PERRLA anicteric, Cardiovascular; regular rate and rhythm with S1-S2 no murmur or gallop, Pulmonary; clear bilaterally, Abdomen; obese soft nontender nondistended bowel sounds are active there is no palpable mass or hepatosplenomegaly appetite rectal exam no external lesion noted on digital exam she has a shallow posterior fissure, Ext;no clubbing cyanosis or edema skin warm and dry, Neuropsych; mood and affect appropriate       Assessment & Plan:   #26 61 year old white female with mild anal discomfort and small volume hematochezia 2 weeks. Exam reveals a shallow posterior fissure #2 history of adenomatous colon polyp-last colonoscopy June 2013. Due for follow-up #3 hypertension #4 obesity #5 left bundle branch block  Plan; continue stool softeners as needed, high-fiber diet and liberal water consumption Add diltiazem gel 2% apply 3 times daily over the next month or until symptoms resolve Will schedule patient for colonoscopy with Dr. Hilarie Fredrickson. Procedure discussed in detail with patient including risks and benefits and she is agreeable to proceed.   Davie Sagona S Samyrah Bruster PA-C 02/11/2017   Cc: [name], MD  Addendum: Reviewed and agree with management. Jerene Bears, MD

## 2017-02-26 ENCOUNTER — Encounter (HOSPITAL_COMMUNITY): Payer: Self-pay

## 2017-02-27 ENCOUNTER — Encounter: Payer: Self-pay | Admitting: Neurology

## 2017-02-27 ENCOUNTER — Ambulatory Visit (INDEPENDENT_AMBULATORY_CARE_PROVIDER_SITE_OTHER): Payer: BLUE CROSS/BLUE SHIELD | Admitting: Neurology

## 2017-02-27 ENCOUNTER — Encounter (INDEPENDENT_AMBULATORY_CARE_PROVIDER_SITE_OTHER): Payer: Self-pay

## 2017-02-27 DIAGNOSIS — R51 Headache: Secondary | ICD-10-CM

## 2017-02-27 DIAGNOSIS — R519 Headache, unspecified: Secondary | ICD-10-CM | POA: Insufficient documentation

## 2017-02-27 MED ORDER — VENLAFAXINE HCL ER 37.5 MG PO CP24: 75.0000 mg | ORAL_CAPSULE | Freq: Every day | ORAL | 11 refills | Status: DC

## 2017-02-27 MED ORDER — SUMATRIPTAN SUCCINATE 50 MG PO TABS
50.0000 mg | ORAL_TABLET | ORAL | 6 refills | Status: DC | PRN
Start: 2017-02-27 — End: 2017-09-09

## 2017-02-27 NOTE — Progress Notes (Signed)
PATIENT: Carolyn Mcguire DOB: 01-25-1956  Chief Complaint  Patient presents with  . Headache    She is here to have her daily headaches further evaluated.  She will occasionally treat the pain with Tylenol.  She sometimes experiences nausea.  She also reports the worsening of neck pain approximately 3-6 months ago.  Carolyn Mcguire PCP    [name], MD     HISTORICAL  Carolyn Mcguire is a 61 years old right-handed female, seen in refer by  her primary care doctor [name] for evaluation of headache, initial evaluation was on February 27 2017.  She has gone through stressful few years, multiple close family member passed away in a short period of time, she had depression anxiety, around 2016, she began to have frequent headaches, vortex, occipital region pressure headaches, at the same time she had constant daily pulsatile tinnitus,  Her headache has become daily, today she reported 6/10 headache,, she is only taking Tylenol as needed, she also complains of depression anxiety,  Trigger for her headaches are exercise, stress, weather change, sleep deprivation, hot, noise, smell, perfume, smoke   Over the years, she was seen by ENT, and other specialists  I reviewed the records she brought from ENT [name], August 01 2016, audiogram in October 2013 was unremarkable, discrimination score was 96%,  MRI of the brain on Apr 25 2016 from [facility], unremarkable MRI of the brain and temporal bone,  [facility] orthopedic evaluation on December 07 2016, complained of neck pain, x-ray showed mild degenerative changes and facet disease at multiple levels, physical therapy was ordered, MRI of cervical spine was ordered,  I also reviewed the record [facility] tinnitus evaluation, findings indicate hearing within normal limits in both ear, with a steeply sloping hearing loss in the extended high frequency only, 12.5-16 K hertz, speech recognition score was excellent, notably like stress and the  psychological condition such as anxiety or depression cane exacerbate her symptoms, interfere with treatment, in her case, stress likely is the aggravating factors that contributes to her tinnitus disturbance  I reviewed and summarized referring note, she had a history of hypertension, hyperlipidemia, had a history of right hemithyroidectomy, for benign thyroid nodule,  Laboratory evaluations in March 2018, normal BMP, CBC, TSH,   I personally reviewed cervical x-ray, mild degenerative changes  REVIEW OF SYSTEMS: Full 14 system review of systems performed and notable only for fatigue, murmur, ringing ears, feeling hot, flushing, joint pain, headaches, depression anxiety  ALLERGIES: Allergies  Allergen Reactions  . Sulfur Rash    MAKES PT RED    HOME MEDICATIONS: Current Outpatient Prescriptions  Medication Sig Dispense Refill  . ALPRAZolam (XANAX) 0.5 MG tablet TK 1 T PO  TID PRN  0  . diltiazem 2 % GEL Apply 1 application topically 3 (three) times daily. For one month then as needed 30 g 1  . levothyroxine (SYNTHROID, LEVOTHROID) 88 MCG tablet Take 88 mcg by mouth daily before breakfast.    . metoprolol succinate (TOPROL XL) 25 MG 24 hr tablet Take 1 tablet (25 mg total) by mouth daily. 90 tablet 3  . Multiple Vitamin (MULTIVITAMIN) tablet Take 1 tablet by mouth daily. GUMMIES    . rosuvastatin (CRESTOR) 10 MG tablet Take 10 mg by mouth daily.    Carolyn Mcguire Specialty Vitamins Products (JOINT/BONE VITALITY PO) Take 2 oz by mouth daily.    . valsartan (DIOVAN) 320 MG tablet Take 320 mg by mouth daily.  0  No current facility-administered medications for this visit.     PAST MEDICAL HISTORY: Past Medical History:  Diagnosis Date  . Adenomatous colon polyp   . Allergic rhinitis   . Animal bite 05/2015   fox   . Anxiety   . Arthritis    shoulder  . Carpal tunnel syndrome    RIGHT HAND  . Depression   . Diastolic dysfunction    a. 2D echo 09/08/15: EF 55-60%, grade 1 DD, elevated  LVEDP, mild MR, mild TR. - normal BNP 08/2015.  Carolyn Mcguire Essential hypertension   . GERD (gastroesophageal reflux disease)   . Headache   . Heart murmur   . Hepatic steatosis   . Hyperlipidemia   . Hypothyroidism   . LBBB (left bundle branch block)    a. negative nuclear stress test 06/2011.  . Migraines   . Obesity     PAST SURGICAL HISTORY: Past Surgical History:  Procedure Laterality Date  . DERMOID CYST  EXCISION     OF HER RIGHT OVARY. SIZE OF A GRAPEFRUIT. OVARY IS STILL THERE.  Carolyn Mcguire HEMITHYROIDECTOMY    . RUPTURE BELLY BUTTON     WHEN SHE WAS AN INFANT  . TONSILECTOMY, ADENOIDECTOMY, BILATERAL MYRINGOTOMY AND TUBES     AGE 47, pt states she didn't have tubes put into her ears    FAMILY HISTORY: Family History  Problem Relation Age of Onset  . Ovarian cancer Mother     dx. 52-59; TAH-BSO at 42  . Diabetes Mother   . Pancreatic cancer Mother 57    d. 9  . Skin cancer Mother     maybe basal cell carcinoma  . Heart disease Father   . Bladder Cancer Father     dx 54-81  . Skin cancer Father     possibly melanoma; +sun exposure  . Cancer Brother 61    salivary gland cancer; d. 53; no tobacco use  . Colon cancer Maternal Uncle     dx late 58s; d. early 35s  . Ovarian cancer Maternal Grandmother     d. 16; w/ mets  . Breast cancer Maternal Aunt     dx unspecified age; s/p mastectomy  . Other Maternal Aunt     intellectual disabilities  . Cancer Paternal Uncle     CLL, dx. 68  . Heart attack Maternal Grandfather     d. 48  . Heart attack Maternal Uncle 51  . Heart failure Maternal Aunt 86    acute diastolic heart failure  . Esophageal cancer Neg Hx   . Rectal cancer Neg Hx   . Stomach cancer Neg Hx     SOCIAL HISTORY:  Social History   Social History  . Marital status: Single    Spouse name: N/A  . Number of children: 0  . Years of education: HS   Occupational History  . Unemployed    Social History Main Topics  . Smoking status: Never Smoker  .  Smokeless tobacco: Never Used  . Alcohol use No  . Drug use: No  . Sexual activity: Not on file   Other Topics Concern  . Not on file   Social History Narrative   Lives at home alone.   Right-handed.   She does not drink caffeine.  She will occasionally eat chocolate.     PHYSICAL EXAM   Vitals:   02/27/17 1116  BP: (!) 134/53  Pulse: 81  Weight: 245 lb (111.1 kg)  Height: 5' 3.5" (1.613 m)  Not recorded      Body mass index is 42.72 kg/m.  PHYSICAL EXAMNIATION:  Gen: NAD, conversant, well nourised, obese, well groomed                     Cardiovascular: Regular rate rhythm, no peripheral edema, warm, nontender. Eyes: Conjunctivae clear without exudates or hemorrhage Neck: Supple, no carotid bruits. Pulmonary: Clear to auscultation bilaterally   NEUROLOGICAL EXAM:  MENTAL STATUS: Speech:    Speech is normal; fluent and spontaneous with normal comprehension.  Cognition:     Orientation to time, place and person     Normal recent and remote memory     Normal Attention span and concentration     Normal Language, naming, repeating,spontaneous speech     Fund of knowledge   CRANIAL NERVES: CN II: Visual fields are full to confrontation. Fundoscopic exam is normal with sharp discs and no vascular changes. Pupils are round equal and briskly reactive to light. CN III, IV, VI: extraocular movement are normal. No ptosis. CN V: Facial sensation is intact to pinprick in all 3 divisions bilaterally. Corneal responses are intact.  CN VII: Face is symmetric with normal eye closure and smile. CN VIII: Hearing is normal to rubbing fingers CN IX, X: Palate elevates symmetrically. Phonation is normal. CN XI: Head turning and shoulder shrug are intact CN XII: Tongue is midline with normal movements and no atrophy.  MOTOR: There is no pronator drift of out-stretched arms. Muscle bulk and tone are normal. Muscle strength is normal.  REFLEXES: Reflexes are 2+ and symmetric  at the biceps, triceps, knees, and ankles. Plantar responses are flexor.  SENSORY: Intact to light touch, pinprick, positional sensation and vibratory sensation are intact in fingers and toes.  COORDINATION: Rapid alternating movements and fine finger movements are intact. There is no dysmetria on finger-to-nose and heel-knee-shin.    GAIT/STANCE: Posture is normal. Gait is steady with normal steps, base, arm swing, and turning. Heel and toe walking are normal. Tandem gait is normal.  Romberg is absent.   DIAGNOSTIC DATA (LABS, IMAGING, TESTING) - I reviewed patient records, labs, notes, testing and imaging myself where available.   ASSESSMENT AND PLAN  SHIRIN ECHEVERRY is a 61 y.o. female   Chronic headaches  Tension headache with some migraine features   Depression anxiety  After discuss with patient, provide her the option of antidepression and topiramate,  We decided to proceed with Effexor XR 37.5 mg titrating to 2 tablets every night as preventative medications  Imitrex 50 mg as needed    Marcial Pacas, M.D. Ph.D.  [facility] [address] [place], [place] [address] Ph: [phone] Fax: [phone]  CC: [name], MD

## 2017-03-04 DIAGNOSIS — E7800 Pure hypercholesterolemia, unspecified: Secondary | ICD-10-CM | POA: Diagnosis not present

## 2017-03-05 DIAGNOSIS — E039 Hypothyroidism, unspecified: Secondary | ICD-10-CM | POA: Diagnosis not present

## 2017-03-05 DIAGNOSIS — E7800 Pure hypercholesterolemia, unspecified: Secondary | ICD-10-CM | POA: Diagnosis not present

## 2017-03-05 DIAGNOSIS — I10 Essential (primary) hypertension: Secondary | ICD-10-CM | POA: Diagnosis not present

## 2017-03-05 DIAGNOSIS — R51 Headache: Secondary | ICD-10-CM | POA: Diagnosis not present

## 2017-03-08 DIAGNOSIS — B373 Candidiasis of vulva and vagina: Secondary | ICD-10-CM | POA: Diagnosis not present

## 2017-03-13 DIAGNOSIS — M542 Cervicalgia: Secondary | ICD-10-CM | POA: Diagnosis not present

## 2017-03-15 ENCOUNTER — Other Ambulatory Visit: Payer: Self-pay

## 2017-03-15 MED ORDER — METOPROLOL SUCCINATE ER 25 MG PO TB24: 25.0000 mg | ORAL_TABLET | Freq: Every day | ORAL | 3 refills | Status: DC

## 2017-03-19 DIAGNOSIS — M542 Cervicalgia: Secondary | ICD-10-CM | POA: Diagnosis not present

## 2017-03-21 DIAGNOSIS — M542 Cervicalgia: Secondary | ICD-10-CM | POA: Diagnosis not present

## 2017-03-27 DIAGNOSIS — M722 Plantar fascial fibromatosis: Secondary | ICD-10-CM | POA: Diagnosis not present

## 2017-04-26 ENCOUNTER — Encounter: Payer: BLUE CROSS/BLUE SHIELD | Admitting: Internal Medicine

## 2017-05-09 ENCOUNTER — Encounter (HOSPITAL_COMMUNITY): Payer: Self-pay

## 2017-05-17 ENCOUNTER — Encounter: Payer: Self-pay | Admitting: Internal Medicine

## 2017-05-31 ENCOUNTER — Ambulatory Visit (AMBULATORY_SURGERY_CENTER): Payer: BLUE CROSS/BLUE SHIELD | Admitting: Internal Medicine

## 2017-05-31 ENCOUNTER — Encounter: Payer: Self-pay | Admitting: Internal Medicine

## 2017-05-31 VITALS — BP 146/64 | HR 67 | Temp 99.1°F | Resp 16 | Ht 63.0 in | Wt 243.0 lb

## 2017-05-31 DIAGNOSIS — Z86010 Personal history of colonic polyps: Secondary | ICD-10-CM

## 2017-05-31 DIAGNOSIS — D124 Benign neoplasm of descending colon: Secondary | ICD-10-CM

## 2017-05-31 MED ORDER — SODIUM CHLORIDE 0.9 % IV SOLN: 500.0000 mL | INTRAVENOUS | Status: DC

## 2017-05-31 NOTE — Op Note (Signed)
Carolyn Mcguire Patient Name: Carolyn Mcguire Procedure Date: 05/31/2017 10:32 AM MRN: [id number] Endoscopist: Jerene Bears , MD Age: 61 Referring MD:  Date of Birth: Apr 05, 1956 Gender: Female Account #: [id number] Procedure:                Colonoscopy Indications:              Surveillance: Personal history of adenomatous                            polyps on last colonoscopy 5 years ago Medicines:                Monitored Anesthesia Care Procedure:                Pre-Anesthesia Assessment:                           - Prior to the procedure, a History and Physical                            was performed, and patient medications and                            allergies were reviewed. The patient's tolerance of                            previous anesthesia was also reviewed. The risks                            and benefits of the procedure and the sedation                            options and risks were discussed with the patient.                            All questions were answered, and informed consent                            was obtained. Prior Anticoagulants: The patient has                            taken no previous anticoagulant or antiplatelet                            agents. ASA Grade Assessment: II - A patient with                            mild systemic disease. After reviewing the risks                            and benefits, the patient was deemed in                            satisfactory condition to undergo the procedure.  After obtaining informed consent, the colonoscope                            was passed under direct vision. Throughout the                            procedure, the patient's blood pressure, pulse, and                            oxygen saturations were monitored continuously. The                            Colonoscope was introduced through the anus and                            advanced to the the cecum,  identified by                            appendiceal orifice and ileocecal valve. The                            colonoscopy was performed without difficulty. The                            patient tolerated the procedure well. The quality                            of the bowel preparation was good. The ileocecal                            valve, appendiceal orifice, and rectum were                            photographed. Scope In: 10:43:00 AM Scope Out: 11:01:41 AM Scope Withdrawal Time: 0 hours 13 minutes 52 seconds  Total Procedure Duration: 0 hours 18 minutes 41 seconds  Findings:                 The digital rectal exam was normal.                           Two sessile polyps were found in the descending                            colon. The polyps were 4 to 7 mm in size. These                            polyps were removed with a cold snare. Resection                            and retrieval were complete.                           The exam was otherwise without abnormality on  direct and retroflexion views. Complications:            No immediate complications. Estimated Blood Loss:     Estimated blood loss was minimal. Impression:               - Two 4 to 7 mm polyps in the descending colon,                            removed with a cold snare. Resected and retrieved.                           - The examination was otherwise normal on direct                            and retroflexion views. Recommendation:           - Patient has a contact number available for                            emergencies. The signs and symptoms of potential                            delayed complications were discussed with the                            patient. Return to normal activities tomorrow.                            Written discharge instructions were provided to the                            patient.                           - Resume previous diet.                            - Continue present medications.                           - Await pathology results.                           - Repeat colonoscopy is recommended for                            surveillance. The colonoscopy date will be                            determined after pathology results from today's                            exam become available for review. Jerene Bears, MD 05/31/2017 11:08:12 AM This report has been signed electronically.

## 2017-05-31 NOTE — Patient Instructions (Signed)
YOU HAD AN ENDOSCOPIC PROCEDURE TODAY AT THE  ENDOSCOPY CENTER:   Refer to the procedure report that was given to you for any specific questions about what was found during the examination.  If the procedure report does not answer your questions, please call your gastroenterologist to clarify.  If you requested that your care partner not be given the details of your procedure findings, then the procedure report has been included in a sealed envelope for you to review at your convenience later.  YOU SHOULD EXPECT: Some feelings of bloating in the abdomen. Passage of more gas than usual.  Walking can help get rid of the air that was put into your GI tract during the procedure and reduce the bloating. If you had a lower endoscopy (such as a colonoscopy or flexible sigmoidoscopy) you may notice spotting of blood in your stool or on the toilet paper. If you underwent a bowel prep for your procedure, you may not have a normal bowel movement for a few days.  Please Note:  You might notice some irritation and congestion in your nose or some drainage.  This is from the oxygen used during your procedure.  There is no need for concern and it should clear up in a day or so.  SYMPTOMS TO REPORT IMMEDIATELY:   Following lower endoscopy (colonoscopy or flexible sigmoidoscopy):  Excessive amounts of blood in the stool  Significant tenderness or worsening of abdominal pains  Swelling of the abdomen that is new, acute  Fever of 100F or higher   For urgent or emergent issues, a gastroenterologist can be reached at any hour by calling [phone].   DIET:  We do recommend a small meal at first, but then you may proceed to your regular diet.  Drink plenty of fluids but you should avoid alcoholic beverages for 24 hours.  ACTIVITY:  You should plan to take it easy for the rest of today and you should NOT DRIVE or use heavy machinery until tomorrow (because of the sedation medicines used during the test).     FOLLOW UP: Our staff will call the number listed on your records the next business day following your procedure to check on you and address any questions or concerns that you may have regarding the information given to you following your procedure. If we do not reach you, we will leave a message.  However, if you are feeling well and you are not experiencing any problems, there is no need to return our call.  We will assume that you have returned to your regular daily activities without incident.  If any biopsies were taken you will be contacted by phone or by letter within the next 1-3 weeks.  Please call us at [phone] if you have not heard about the biopsies in 3 weeks.    SIGNATURES/CONFIDENTIALITY: You and/or your care partner have signed paperwork which will be entered into your electronic medical record.  These signatures attest to the fact that that the information above on your After Visit Summary has been reviewed and is understood.  Full responsibility of the confidentiality of this discharge information lies with you and/or your care-partner.  Thank you for letting us take care of your healthcare needs today. 

## 2017-05-31 NOTE — Progress Notes (Signed)
Pt's states no medical or surgical changes since previsit or office visit. 

## 2017-05-31 NOTE — Progress Notes (Signed)
Report to PACU, RN, vss, BBS= Clear.  

## 2017-05-31 NOTE — Progress Notes (Signed)
Called to room to assist during endoscopic procedure.  Patient ID and intended procedure confirmed with present staff. Received instructions for my participation in the procedure from the performing physician.  

## 2017-06-03 ENCOUNTER — Telehealth: Payer: Self-pay | Admitting: *Deleted

## 2017-06-03 NOTE — Telephone Encounter (Signed)
  Follow up Call-  Call back number 05/31/2017 12/28/2015  Post procedure Call Back phone  # [phone]  Permission to leave phone message Yes Yes  Some recent data might be hidden     Patient questions:  Do you have a fever, pain , or abdominal swelling? No. Pain Score  0 *  Have you tolerated food without any problems? Yes.    Have you been able to return to your normal activities? Yes.    Do you have any questions about your discharge instructions: Diet   No. Medications  No. Follow up visit  No.   Do you have questions or concerns about your Care? No.  Actions: * If pain score is 4 or above: No action needed, pain <4.

## 2017-06-04 ENCOUNTER — Encounter: Payer: Self-pay | Admitting: Internal Medicine

## 2017-06-04 ENCOUNTER — Ambulatory Visit: Payer: BLUE CROSS/BLUE SHIELD | Admitting: Nurse Practitioner

## 2017-06-07 ENCOUNTER — Ambulatory Visit (INDEPENDENT_AMBULATORY_CARE_PROVIDER_SITE_OTHER): Payer: BLUE CROSS/BLUE SHIELD | Admitting: Nurse Practitioner

## 2017-06-07 ENCOUNTER — Encounter: Payer: Self-pay | Admitting: Nurse Practitioner

## 2017-06-07 VITALS — BP 144/76 | HR 65 | Ht 63.5 in | Wt 246.0 lb

## 2017-06-07 DIAGNOSIS — R51 Headache: Secondary | ICD-10-CM

## 2017-06-07 DIAGNOSIS — R400 Somnolence: Secondary | ICD-10-CM | POA: Diagnosis not present

## 2017-06-07 DIAGNOSIS — R519 Headache, unspecified: Secondary | ICD-10-CM

## 2017-06-07 NOTE — Progress Notes (Signed)
I have read the note, and I agree with the clinical assessment and plan.  Barbra Miner KEITH   

## 2017-06-07 NOTE — Patient Instructions (Addendum)
Continue Imitrex acutely Given list of food that are migraine triggers Will set up for sleep study due to daytime drowsiness Follow-up with me in 3 months I explained in particular the risks and ramifications of untreated moderate to severe OSA, especially with respect to cardiovascular disease  including congestive heart failure, difficult to treat hypertension, cardiac arrhythmias, or stroke. Even type 2 diabetes has, in part, been linked to untreated OSA. Symptoms of untreated OSA include daytime sleepiness, memory problems, mood irritability and mood disorder such as depression and anxiety, lack of energy, as well as recurrent headaches, especially morning headaches. We talked about trying to maintain a healthy lifestyle in general, as well as the importance of weight control. I encouraged the patient to eat healthy, exercise daily and keep well hydrated, to keep a scheduled bedtime and wake time routine, to not skip any meals and eat healthy snacks in between meals

## 2017-06-07 NOTE — Progress Notes (Signed)
[facility]  PATIENT: Carolyn Mcguire DOB: 27-Aug-1956   REASON FOR VISIT: Follow-up for headache, new complaint daytime somnolence HISTORY FROM: Patient    HISTORY OF PRESENT ILLNESS:UPDATE 07/13/2018[name], 61 year old female returns for follow-up. She has a history of daily headaches tension type with some migraine features. Due to the significant stressors in her life she was placed on Effexor by Dr. [name], however she never got the prescription filled and does not want to be on antidepressant. She had multiple family members pass away in short period of time. Today she also complains of daytime drowsiness she is morbidly obese and snores. She has a history of hypertension and hyperlipidemia. MRI of the brain in 2017 was normal. She returns for reevaluation.  HISTORY 02/27/17 Carolyn Mcguire is a 61 years old right-handed female, seen in refer by  her primary care doctor [name] for evaluation of headache, initial evaluation was on February 27 2017. She has gone through stressful few years, multiple close family member passed away in a short period of time, she had depression anxiety, around 2016, she began to have frequent headaches, vortex, occipital region pressure headaches, at the same time she had constant daily pulsatile tinnitus, Her headache has become daily, today she reported 6/10 headache,, she is only taking Tylenol as needed, she also complains of depression anxiety, Trigger for her headaches are exercise, stress, weather change, sleep deprivation, hot, noise, smell, perfume, smoke  Over the years, she was seen by ENT, and other specialists  I reviewed the records she brought from ENT [name], August 01 2016, audiogram in October 2013 was unremarkable, discrimination score was 96%,  MRI of the brain on Apr 25 2016 from [facility], unremarkable MRI of the brain and temporal bone,  [facility] orthopedic evaluation on December 07 2016, complained of neck pain, x-ray showed mild degenerative changes and facet disease at multiple levels, physical therapy was ordered, MRI of cervical spine was ordered, I also reviewed the record from [facility] tinnitus evaluation, findings indicate hearing within normal limits in both ear, with a steeply sloping hearing loss in the extended high frequency only, 12.5-16 K hertz, speech recognition score was excellent, notably like stress and the psychological condition such as anxiety or depression cane exacerbate her symptoms, interfere with treatment, in her case, stress likely is the aggravating factors that contributes to her tinnitus disturbance I reviewed and summarized referring note, she had a history of hypertension, hyperlipidemia, had a history of right hemithyroidectomy, for benign thyroid nodule, Laboratory evaluations in March 2018, normal BMP, CBC, TSH,  I personally reviewed cervical x-ray, mild degenerative changes   REVIEW OF SYSTEMS: Full 14 system review of systems performed and notable only for those listed, all others are neg:  Constitutional: neg  Cardiovascular: neg Ear/Nose/Throat: Ringing in the ears Skin: neg Eyes: neg Respiratory: neg Gastroitestinal: neg  Hematology/Lymphatic: neg  Endocrine: Intolerance to heat Musculoskeletal:neg Allergy/Immunology: neg Neurological: Headache Psychiatric: neg Sleep : Daytime drowsiness   ALLERGIES: Allergies  Allergen Reactions  . Sulfur Rash    MAKES PT RED    HOME MEDICATIONS: Outpatient Medications Prior to Visit  Medication Sig Dispense Refill  . ALPRAZolam (XANAX) 0.5 MG tablet TK 1 T PO  TID PRN  0  . diltiazem 2 % GEL Apply 1 application topically 3 (three) times daily. For one month then as needed (Patient not taking: Reported on 05/31/2017) 30 g 1  . levothyroxine (SYNTHROID, LEVOTHROID) 88 MCG tablet Take  88 mcg by mouth daily before breakfast.    . metoprolol succinate (TOPROL XL) 25 MG 24 hr tablet Take  1 tablet (25 mg total) by mouth daily. 90 tablet 3  . Multiple Vitamin (MULTIVITAMIN) tablet Take 1 tablet by mouth daily. GUMMIES    . rosuvastatin (CRESTOR) 10 MG tablet Take 10 mg by mouth daily.    Marland Kitchen Specialty Vitamins Products (JOINT/BONE VITALITY PO) Take 2 oz by mouth daily.    . SUMAtriptan (IMITREX) 50 MG tablet Take 1 tablet (50 mg total) by mouth every 2 (two) hours as needed for migraine. May repeat in 2 hours if headache persists or recurs. 15 tablet 6  . valsartan (DIOVAN) 320 MG tablet Take 320 mg by mouth daily.  0  . venlafaxine XR (EFFEXOR XR) 37.5 MG 24 hr capsule Take 2 capsules (75 mg total) by mouth daily with breakfast. 60 capsule 11   Facility-Administered Medications Prior to Visit  Medication Dose Route Frequency Provider Last Rate Last Dose  . 0.9 %  sodium chloride infusion  500 mL Intravenous Continuous Pyrtle, Lajuan Lines, MD        PAST MEDICAL HISTORY: Past Medical History:  Diagnosis Date  . Adenomatous colon polyp   . Allergic rhinitis   . Animal bite 05/2015   fox   . Anxiety   . Arthritis    shoulder  . Carpal tunnel syndrome    RIGHT HAND  . Depression   . Diastolic dysfunction    a. 2D echo 09/08/15: EF 55-60%, grade 1 DD, elevated LVEDP, mild MR, mild TR. - normal BNP 08/2015.  Marland Kitchen Essential hypertension   . GERD (gastroesophageal reflux disease)   . Headache   . Heart murmur   . Hepatic steatosis   . Hyperlipidemia   . Hypothyroidism   . LBBB (left bundle branch block)    a. negative nuclear stress test 06/2011.  . Migraines   . Obesity   . OSA (obstructive sleep apnea)    mild    PAST SURGICAL HISTORY: Past Surgical History:  Procedure Laterality Date  . DERMOID CYST  EXCISION     OF HER RIGHT OVARY. SIZE OF A GRAPEFRUIT. OVARY IS STILL THERE.  Marland Kitchen HEMITHYROIDECTOMY    . RUPTURE BELLY BUTTON     WHEN SHE WAS AN INFANT  . TONSILECTOMY, ADENOIDECTOMY, BILATERAL MYRINGOTOMY AND TUBES     AGE 88, pt states she didn't have tubes put into her  ears    FAMILY HISTORY: Family History  Problem Relation Age of Onset  . Ovarian cancer Mother        dx. 60-59; TAH-BSO at 27  . Diabetes Mother   . Pancreatic cancer Mother 21       d. 15  . Skin cancer Mother        maybe basal cell carcinoma  . Heart disease Father   . Bladder Cancer Father        dx 22-81  . Skin cancer Father        possibly melanoma; +sun exposure  . Cancer Brother 34       salivary gland cancer; d. 44; no tobacco use  . Colon cancer Maternal Uncle        dx late 21s; d. early 4s  . Ovarian cancer Maternal Grandmother        d. 82; w/ mets  . Breast cancer Maternal Aunt        dx unspecified age; s/p mastectomy  . Other  Maternal Aunt        intellectual disabilities  . Cancer Paternal Uncle        CLL, dx. 29  . Heart attack Maternal Grandfather        d. 62  . Heart attack Maternal Uncle 51  . Heart failure Maternal Aunt 86       acute diastolic heart failure  . Esophageal cancer Neg Hx   . Rectal cancer Neg Hx   . Stomach cancer Neg Hx     SOCIAL HISTORY: Social History   Social History  . Marital status: Single    Spouse name: N/A  . Number of children: 0  . Years of education: HS   Occupational History  . Unemployed    Social History Main Topics  . Smoking status: Never Smoker  . Smokeless tobacco: Never Used  . Alcohol use No  . Drug use: No  . Sexual activity: Not on file   Other Topics Concern  . Not on file   Social History Narrative   Lives at home alone.   Right-handed.   She does not drink caffeine.  She will occasionally eat chocolate.     PHYSICAL EXAM  Vitals:   06/07/17 0924  BP: (!) 144/76  Pulse: 65  Weight: 246 lb (111.6 kg)  Height: 5' 3.5" (1.613 m)   Body mass index is 42.89 kg/m.  Generalized: Well developed,Obese female in no acute distress  Head: normocephalic and atraumatic,. Oropharynx benign  Neck: Supple,  Cardiac: Regular rate rhythm, no murmur  Musculoskeletal: No deformity    Neurological examination   Mentation: Alert oriented to time, place, history taking. Attention span and concentration appropriate. Recent and remote memory intact.  Follows all commands speech and language fluent. ESS 9  Cranial nerve II-XII: .Pupils were equal round reactive to light extraocular movements were full, visual field were full on confrontational test. Facial sensation and strength were normal. hearing was intact to finger rubbing bilaterally. Uvula tongue midline. head turning and shoulder shrug were normal and symmetric.Tongue protrusion into cheek strength was normal. Motor: normal bulk and tone, full strength in the BUE, BLE, fine finger movements normal, no pronator drift. No focal weakness Sensory: normal and symmetric to light touch, on the face arms and legs Coordination: finger-nose-finger, heel-to-shin bilaterally, no dysmetria Reflexes: Brachioradialis 2/2, biceps 2/2, triceps 2/2, patellar 2/2, Achilles 2/2, plantar responses were flexor bilaterally. Gait and Station: Rising up from seated position without assistance, normal stance,  moderate stride, good arm swing, smooth turning, able to perform tiptoe, and heel walking without difficulty. Tandem gait is steady  DIAGNOSTIC DATA (LABS, IMAGING, TESTING) - I reviewed patient records, labs, notes, testing and imaging myself where available.  Lab Results  Component Value Date   WBC 8.5 09/16/2015   HGB 14.4 09/16/2015   HCT 40.1 09/16/2015   MCV 83.9 09/16/2015   PLT 213 09/16/2015      Component Value Date/Time   NA 142 01/25/2017 1147   K 4.2 01/25/2017 1147   CL 100 01/25/2017 1147   CO2 23 01/25/2017 1147   GLUCOSE 93 01/25/2017 1147   GLUCOSE 89 08/02/2016 1127   BUN 20 01/25/2017 1147   CREATININE 0.97 01/25/2017 1147   CREATININE 0.84 08/02/2016 1127   CALCIUM 9.1 01/25/2017 1147   PROT 7.2 09/16/2015 1026   ALBUMIN 4.4 09/16/2015 1026   AST 18 09/16/2015 1026   ALT 15 09/16/2015 1026   ALKPHOS  85 09/16/2015 1026   BILITOT 0.9  09/16/2015 1026   GFRNONAA 65 01/25/2017 1147   GFRAA 74 01/25/2017 1147    ASSESSMENT AND PLAN  61 y.o. year old female  has a past medical history of  Allergic rhinitis;  Anxiety; Arthritis; Carpal tunnel syndrome; Depression; Diastolic dysfunction; Essential hypertension;  Headache;  Hepatic steatosis; Hyperlipidemia; Hypothyroidism; LBBB (left bundle branch block); Migraines; Obesity; here for follow-up for her headaches and new complaint of daytime somnolence.The patient is a current patient of Dr. [name]  who is out of the office today . This note is sent to the work in doctor.                Continue Imitrex acutely Given list of food that are migraine triggers reviewed these with her Will set up for sleep study due to daytime drowsiness Follow-up with me in 3 months I explained in particular the risks and ramifications of untreated moderate to severe OSA, especially with respect to cardiovascular disease  including congestive heart failure, difficult to treat hypertension, cardiac arrhythmias, or stroke. Even type 2 diabetes has, in part, been linked to untreated OSA. Symptoms of untreated OSA include daytime sleepiness, memory problems, mood irritability and mood disorder such as depression and anxiety, lack of energy, as well as recurrent headaches, especially morning headaches. We talked about trying to maintain a healthy lifestyle in general, as well as the importance of weight control. I encouraged the patient to eat healthy, exercise daily and keep well hydrated, to keep a scheduled bedtime and wake time routine, to not skip any meals and eat healthy snacks in between meals Carolyn Mcguire, [facility], [facility], [facility] [address], [place], [place] [address] [phone]

## 2017-06-10 DIAGNOSIS — S63639A Sprain of interphalangeal joint of unspecified finger, initial encounter: Secondary | ICD-10-CM | POA: Diagnosis not present

## 2017-06-10 DIAGNOSIS — M79644 Pain in right finger(s): Secondary | ICD-10-CM | POA: Diagnosis not present

## 2017-06-24 DIAGNOSIS — Z1151 Encounter for screening for human papillomavirus (HPV): Secondary | ICD-10-CM | POA: Diagnosis not present

## 2017-06-24 DIAGNOSIS — Z01419 Encounter for gynecological examination (general) (routine) without abnormal findings: Secondary | ICD-10-CM | POA: Diagnosis not present

## 2017-06-24 DIAGNOSIS — Z8041 Family history of malignant neoplasm of ovary: Secondary | ICD-10-CM | POA: Diagnosis not present

## 2017-06-24 DIAGNOSIS — Z6841 Body Mass Index (BMI) 40.0 and over, adult: Secondary | ICD-10-CM | POA: Diagnosis not present

## 2017-07-03 DIAGNOSIS — D279 Benign neoplasm of unspecified ovary: Secondary | ICD-10-CM | POA: Diagnosis not present

## 2017-07-03 DIAGNOSIS — Z8041 Family history of malignant neoplasm of ovary: Secondary | ICD-10-CM | POA: Diagnosis not present

## 2017-07-11 ENCOUNTER — Ambulatory Visit (INDEPENDENT_AMBULATORY_CARE_PROVIDER_SITE_OTHER): Payer: BLUE CROSS/BLUE SHIELD | Admitting: Sports Medicine

## 2017-07-11 VITALS — BP 138/90 | Ht 64.5 in | Wt 245.0 lb

## 2017-07-11 DIAGNOSIS — S96911D Strain of unspecified muscle and tendon at ankle and foot level, right foot, subsequent encounter: Secondary | ICD-10-CM | POA: Diagnosis not present

## 2017-07-11 NOTE — Progress Notes (Signed)
   Subjective:    Patient ID: Carolyn Mcguire, female    DOB: May 02, 1956, 61 y.o.   MRN: [id number]  HPI chief complaint: Right ankle follow-up  Patient comes in today to discuss chronic right ankle catching and popping. She was seen in my office a year ago. An MRI at that time showed a complete tear of the peroneal longus tendon at the level of the cuboid. She was referred to Dr. Doran Durand but she does not remember exactly what he told her. She does recall being placed into a boot briefly and doing some PT. She has not had surgery. She has also seen podiatry and they have given her a couple of pairs of custom orthotics. She wears these occasionally. She really doesn't have much in the way of pain in her foot or ankle. Her main complaint is a catching and popping that she describes as "annoying". She has not noticed any swelling. She's not noticed any weakness. She is not limping. No numbness or tingling. No recent injury.    Review of Systems    as above Objective:   Physical Exam  Well-developed, well-nourished. No acute distress. Sitting comfortably in the exam room  Right ankle: Full range of motion. No effusion. No soft tissue swelling. There is a subtle amount of weakness with resisted dorsiflexion and eversion when compared to the uninvolved left ankle. Otherwise her strength is full. Good ankle stability. Neurovascularly intact distally. No evidence of peroneal tendon subluxation. Walking without a limp.      Assessment & Plan:   Chronically torn peroneal longus tendon, right ankle  Patient is not having pain. Main complaint is popping and catching which is difficult to localize. It does not seem to be affecting her activities of daily living. My advice is that she continue to live with things the way they are. I do not think surgery is in her best interest at this point. She certainly does not have significant loss of strength and she is able to ambulate quite easily with and without  orthotics. She is asking about the possibility of some green insoles and scaphoid pads for her tennis shoes. Unfortunately, she did not bring her tennis shoes with her today. I told her she could stop by the office at her convenience and we would be happy to fit her shoes with the inserts. Otherwise, follow-up as needed.

## 2017-07-17 DIAGNOSIS — Z1231 Encounter for screening mammogram for malignant neoplasm of breast: Secondary | ICD-10-CM | POA: Diagnosis not present

## 2017-07-18 ENCOUNTER — Encounter: Payer: Self-pay | Admitting: Neurology

## 2017-07-18 ENCOUNTER — Encounter (INDEPENDENT_AMBULATORY_CARE_PROVIDER_SITE_OTHER): Payer: Self-pay

## 2017-07-18 ENCOUNTER — Ambulatory Visit (INDEPENDENT_AMBULATORY_CARE_PROVIDER_SITE_OTHER): Payer: BLUE CROSS/BLUE SHIELD | Admitting: Neurology

## 2017-07-18 VITALS — BP 152/68 | HR 61 | Ht 64.0 in | Wt 247.0 lb

## 2017-07-18 DIAGNOSIS — H04123 Dry eye syndrome of bilateral lacrimal glands: Secondary | ICD-10-CM

## 2017-07-18 DIAGNOSIS — I10 Essential (primary) hypertension: Secondary | ICD-10-CM

## 2017-07-18 DIAGNOSIS — R0683 Snoring: Secondary | ICD-10-CM | POA: Diagnosis not present

## 2017-07-18 DIAGNOSIS — G4719 Other hypersomnia: Secondary | ICD-10-CM | POA: Diagnosis not present

## 2017-07-18 DIAGNOSIS — Z6841 Body Mass Index (BMI) 40.0 and over, adult: Secondary | ICD-10-CM | POA: Diagnosis not present

## 2017-07-18 DIAGNOSIS — G43711 Chronic migraine without aura, intractable, with status migrainosus: Secondary | ICD-10-CM

## 2017-07-18 DIAGNOSIS — R682 Dry mouth, unspecified: Secondary | ICD-10-CM

## 2017-07-18 NOTE — Progress Notes (Signed)
SLEEP MEDICINE CLINIC   Provider:  Larey Seat, M D  Primary Care Physician:  [name], MD   Referring Provider: [name], MD   Chief Complaint  Patient presents with  . New Patient (Initial Visit)    pt referred by [name] for sleep. pt has difficulty with sleep, example she went to bed at 11:30 pm and didnt fall asleep until 1:30 am. pt woke up before her alarm. pt denies taking meds to help go to sleep.  pt unaware if she snores in sleep.     HPI:  Carolyn Mcguire is a 61 y.o. female , seen here as in a referral/ revisit  from Dr. [name],    I had the pleasure of seeing Carolyn Mcguire today on 07/18/2017, she is referred by [name], nurse practitioner a patient of Dr. [name]. Has seen her for headaches but has a new complained of excessive daytime sleepiness and has problems to sleep at night. Just last month she had reported that there was significant stressors in her life and she was placed on Effexor but did not get the prescription filled, she  lost several family members in a short period of time. She also complained about daytime drowsiness, she has experienced weight gain, she has been told that she snores and she carries a diagnosis of hypertension and hyperlipidemia.. She is not diabetic. She reports hearing the pulsations of her hard but she has tinnitus, but MRI and MRA have been unremarkable. She often goes to bed with headaches and wakes up with headaches, the patient also experiences nausea with her headaches, photophobia and phonophobia. She wakes up with headaches that are not necessarily worse than the headaches she went to bed with but she is also not able to sleep headaches off. The headaches do not wake her from sleep.  Chief complaint according to patient : " I could fall asleep right here in your waiting room"   Sleep habits are as follows: A typical evening and swears watching TV before she goes to the bedroom. This would be around 10 PM to midnight, but  she watches TV in her bedroom as well. (The patient resides in a home that she shared with her parents, one of the bedrooms was her mother's and she passed away and it. The living room had always been occupied by her parents and therefore she developed the habit of watching TV in her own bedroom. ) she sleeps on her side, on 2-3 pillows, gets hot at night. Once she is asleep she will stay asleep for 3 or 4 hours at the most, she may have to go to the bathroom by about  4 AM. Sometimes she cannot go back to sleep after going to the bathroom. Sometimes she cannot go back to bed and to sleep after her bathroom break, more often than not. The patient is retired and can rise at leisure. Depending on her time to go to sleep.    Sleep medical history and family sleep history:  Parents did not have apnea. Snoring. She hwas never a sleep walker,no night terrors, nightmares.  The patient is status post hemithyroidectomy for a nodule, she begun gaining weight after surgery and had continued to do so.Body mass index is 42.4 kg/m. is now    Social history:  Lives  alone, no children. Disabled,  Has a cat, The patient drinks caffeine but she does not use drugs, alcohol or tobacco products. She eats chocolate and drinks iced tea.  HISTORICAL [name] is a 61 years old right-handed female, seen in refer by  her primary care doctor [name] for evaluation of headache, initial evaluation was on February 27 2017. She has gone through stressful few years, multiple close family member passed away in a short period of time, she had depression anxiety, around 2016, she began to have frequent headaches, vortex, occipital region pressure headaches, at the same time she had constant daily pulsatile tinnitus, Her headache has become daily, today she reported 6/10 headache,, she is only taking Tylenol as needed, she also complains of depression anxiety, Trigger for her headaches are exercise, stress,  weather change, sleep deprivation, hot, noise, smell, perfume, smoke  Over the years, she was seen by ENT, and other specialists.I reviewed the records she brought from ENT [name], August 01 2016, audiogram in October 2013 was unremarkable, discrimination score was 96%, MRI of the brain on Apr 25 2016 from [facility], unremarkable MRI of the brain and temporal bone, [facility] orthopedic evaluation on December 07 2016, complained of neck pain, x-ray showed mild degenerative changes and facet disease at multiple levels, physical therapy was ordered, MRI of cervical spine was ordered, I also reviewed the record from [facility] tinnitus evaluation, findings indicate hearing within normal limits in both ear, with a steeply sloping hearing loss in the extended high frequency only, 12.5-16 K hertz, speech recognition score was excellent, notably like stress and the psychological condition such as anxiety or depression cane exacerbate her symptoms, interfere with treatment, in her case, stress likely is the aggravating factors that contributes to her tinnitus disturbance I reviewed and summarized referring note, she had a history of hypertension, hyperlipidemia, had a history of right hemithyroidectomy, for benign thyroid nodule, Laboratory evaluations in March 2018, normal BMP, CBC, TSH,  I personally reviewed cervical x-ray, mild degenerative changes " This was Dr [name] note.    Review of Systems: Out of a complete 14 system review, the patient complains of only the following symptoms, and all other reviewed systems are negative.  weight gain, snoring, sleepiness. Patient endorsed the Epworth sleepiness score at 13 points, the fatigue severity scale at 32 points, she endorsed allergies, runny nose, joint pain, flushing and feeling hot, frequent coughing, heart murmur, fatigue, pulsatile tinnitus, headaches, depression anxiety, high cholesterol and hypertension, morbid obesity.     Social  History   Social History  . Marital status: Single    Spouse name: N/A  . Number of children: 0  . Years of education: HS   Occupational History  . Unemployed    Social History Main Topics  . Smoking status: Never Smoker  . Smokeless tobacco: Never Used  . Alcohol use No  . Drug use: No  . Sexual activity: Not on file   Other Topics Concern  . Not on file   Social History Narrative   Lives at home alone.   Right-handed.   She does not drink caffeine.  She will occasionally eat chocolate.    Family History  Problem Relation Age of Onset  . Ovarian cancer Mother        dx. 1-59; TAH-BSO at 5  . Diabetes Mother   . Pancreatic cancer Mother 53       d. 24  . Skin cancer Mother        maybe basal cell carcinoma  . Heart disease Father   . Bladder Cancer Father        dx  80-81  . Skin cancer Father        possibly melanoma; +sun exposure  . Cancer Brother 50       salivary gland cancer; d. 43; no tobacco use  . Colon cancer Maternal Uncle        dx late 37s; d. early 19s  . Ovarian cancer Maternal Grandmother        d. 21; w/ mets  . Breast cancer Maternal Aunt        dx unspecified age; s/p mastectomy  . Other Maternal Aunt        intellectual disabilities  . Cancer Paternal Uncle        CLL, dx. 41  . Heart attack Maternal Grandfather        d. 34  . Heart attack Maternal Uncle 51  . Heart failure Maternal Aunt 86       acute diastolic heart failure  . Esophageal cancer Neg Hx   . Rectal cancer Neg Hx   . Stomach cancer Neg Hx     Past Medical History:  Diagnosis Date  . Adenomatous colon polyp   . Allergic rhinitis   . Animal bite 05/2015   fox   . Anxiety   . Arthritis    shoulder  . Carpal tunnel syndrome    RIGHT HAND  . Depression   . Diastolic dysfunction    a. 2D echo 09/08/15: EF 55-60%, grade 1 DD, elevated LVEDP, mild MR, mild TR. - normal BNP 08/2015.  Marland Kitchen Essential hypertension   . GERD (gastroesophageal reflux disease)   .  Headache   . Heart murmur   . Hepatic steatosis   . Hyperlipidemia   . Hypothyroidism   . LBBB (left bundle branch block)    a. negative nuclear stress test 06/2011.  . Migraines   . Obesity   . OSA (obstructive sleep apnea)    mild    Past Surgical History:  Procedure Laterality Date  . DERMOID CYST  EXCISION     OF HER RIGHT OVARY. SIZE OF A GRAPEFRUIT. OVARY IS STILL THERE.  Marland Kitchen HEMITHYROIDECTOMY    . RUPTURE BELLY BUTTON     WHEN SHE WAS AN INFANT  . TONSILECTOMY, ADENOIDECTOMY, BILATERAL MYRINGOTOMY AND TUBES     AGE 19, pt states she didn't have tubes put into her ears    Current Outpatient Prescriptions  Medication Sig Dispense Refill  . levothyroxine (SYNTHROID, LEVOTHROID) 88 MCG tablet Take 88 mcg by mouth daily before breakfast.    . losartan (COZAAR) 100 MG tablet Take 100 mg by mouth daily. as directed  3  . metoprolol succinate (TOPROL XL) 25 MG 24 hr tablet Take 1 tablet (25 mg total) by mouth daily. 90 tablet 3  . Multiple Vitamin (MULTIVITAMIN) tablet Take 1 tablet by mouth daily. GUMMIES    . rosuvastatin (CRESTOR) 10 MG tablet Take 10 mg by mouth daily.    Marland Kitchen Specialty Vitamins Products (JOINT/BONE VITALITY PO) Take 2 oz by mouth daily.    . SUMAtriptan (IMITREX) 50 MG tablet Take 1 tablet (50 mg total) by mouth every 2 (two) hours as needed for migraine. May repeat in 2 hours if headache persists or recurs. 15 tablet 6   No current facility-administered medications for this visit.     Allergies as of 07/18/2017 - Review Complete 07/18/2017  Allergen Reaction Noted  . Sulfur Rash 06/15/2011    Vitals: BP (!) 152/68   Pulse 61   Ht 5\' 4"  (  1.626 m)   Wt 247 lb (112 kg)   BMI 42.40 kg/m  Last Weight:  Wt Readings from Last 1 Encounters:  07/18/17 247 lb (112 kg)   ZOX:WRUE mass index is 42.4 kg/m.     Last Height:   Ht Readings from Last 1 Encounters:  07/18/17 5\' 4"  (1.626 m)    Physical exam:  General: The patient is awake, alert and appears  not in acute distress. The patient is well groomed. Head: Normocephalic, atraumatic. Neck is supple. Mallampati 3,  neck circumference:17. Nasal airflow patent ,  Retrognathia is seen.  Cardiovascular:  Regular rate and rhythm , without  murmurs or carotid bruit, and without distended neck veins. Respiratory: Lungs are clear to auscultation. Skin:  Without evidence of edema, or rash Trunk: BMI is 42 . The patient's posture is stooped, portly gait.   Neurologic exam : The patient is awake and alert, oriented to place and time.    Attention span & concentration ability appears normal.  Speech is fluent,  without dysarthria, dysphonia or aphasia.  Mood and affect are appropriate.  Cranial nerves:  Pupils are equal and briskly reactive to light.  Extraocular movements  in vertical and horizontal planes intact and without nystagmus. Visual fields by finger perimetry are intact.Hearing to finger rub intact.  Facial sensation intact to fine touch.Facial motor strength is symmetric and tongue and uvula move midline. Shoulder shrug was symmetrical.  Motor exam: Normal tone, muscle bulk and symmetric strength in all extremities. Sensory:  Fine touch, pinprick and vibration were tested in all extremities. Proprioception tested in the upper extremities was normal. Coordination: Finger-to-nose maneuver  normal without evidence of ataxia, dysmetria or tremor. Gait and station: Patient walks without assistive device . Strength within normal limits. Stance is stable and normal.   Deep tendon reflexes: in the  upper and lower extremities are symmetric and intact. Babinski maneuver response is  downgoing.    Assessment:  After physical and neurologic examination, review of laboratory studies,  Personal review of imaging studies, reports of other /same  Imaging studies, results of polysomnography and / or neurophysiology testing and pre-existing records as far as provided in visit., my assessment is   1) I do  suspect that Mrs. Shadd has obstructive sleep apnea based on her neck circumference, body mass index, comorbidities upper airway anatomy and her history. She does wake up in the early morning hours for at least the bathroom break, she sleeps elevated on more than one pillow, and she wakes up with a dry mouth. I am not sure if her headaches are sleep related but if her apnea is Associated with oxygen deprivation, if the patient spends more than 30 minutes at night at an oxygen saturation of 88% or less, this is a possible comorbidity. I'm glad to say that she does not have cluster headaches and that her headaches seem to be migrainous in nature and responding to Imitrex. I will order a split night polysomnography for the patient.  The patient was advised of the nature of the diagnosed disorder , the treatment options and the  risks for general health and wellness arising from not treating the condition.   I spent more than 45 minutes of face to face time with the patient.  Greater than 50% of time was spent in counseling and coordination of care. We have discussed the diagnosis and differential and I answered the patient's questions.    Plan:  Treatment plan and additional workup : Capnography  with split polysomnography, morbidly obese, status post thyroid tumor, EDS.   Larey Seat, MD 7/93/9688, 64:84 PM  Certified in Neurology by ABPN Certified in [place] by [facility] [address] [place] [employer] [place], [place] [address]

## 2017-07-22 ENCOUNTER — Telehealth: Payer: Self-pay | Admitting: Neurology

## 2017-07-22 DIAGNOSIS — H01004 Unspecified blepharitis left upper eyelid: Secondary | ICD-10-CM | POA: Diagnosis not present

## 2017-07-22 DIAGNOSIS — H01001 Unspecified blepharitis right upper eyelid: Secondary | ICD-10-CM | POA: Diagnosis not present

## 2017-07-22 DIAGNOSIS — H2513 Age-related nuclear cataract, bilateral: Secondary | ICD-10-CM | POA: Diagnosis not present

## 2017-07-22 DIAGNOSIS — H5213 Myopia, bilateral: Secondary | ICD-10-CM | POA: Diagnosis not present

## 2017-07-22 NOTE — Telephone Encounter (Signed)
[employer] denied Split sleep and approved HST.  Can I get an order for HST? °

## 2017-07-23 ENCOUNTER — Other Ambulatory Visit: Payer: Self-pay | Admitting: Neurology

## 2017-07-23 DIAGNOSIS — G4733 Obstructive sleep apnea (adult) (pediatric): Secondary | ICD-10-CM

## 2017-08-07 ENCOUNTER — Ambulatory Visit (INDEPENDENT_AMBULATORY_CARE_PROVIDER_SITE_OTHER): Payer: BLUE CROSS/BLUE SHIELD | Admitting: Neurology

## 2017-08-07 DIAGNOSIS — G4733 Obstructive sleep apnea (adult) (pediatric): Secondary | ICD-10-CM | POA: Diagnosis not present

## 2017-08-16 NOTE — Procedures (Signed)
NAME: Carolyn Mcguire      DOB: 1956-11-11 MEDICAL RECORD [id number]    DOS: 08/14/17 REFERRING PHYSICIAN: Marcial Pacas, MD / Lowella Dandy STUDY PERFORMED: Home Sleep Study Chief complaint according to patient : " I could fall asleep right here in your waiting room"   HISTORY:    Chief complaint: headaches but has a new complained of excessive daytime sleepiness and has problems to sleep at night. Just last month she had reported that there was significant stressors in her life, she lost several family members in a short period of time. She was prescribed Effexor but did not get the prescription filled.  She also complained about daytime drowsiness, she has experienced weight gain, is now morbidly obese.  She has been told that she snores and she carries a diagnosis of hypertension and hyperlipidemia. She is not diabetic. She reports pulsatile tinnitus, but MRI and MRA have been unremarkable. She often goes to bed with headaches and wakes up with headaches, the patient also experiences nausea with her headaches, photophobia and phono-phobia. She wakes up with headaches that are not necessarily worse than the headaches she went to bed with. The headaches do not wake her from sleep.  Patient endorsed the Epworth sleepiness score at 13 points, the fatigue severity scale at 32 points, BMI is 42.3 kg/m2   STUDY RESULTS: Total Recording:    6 hours and 14 minutes Total Apnea/Hypopnea Index (AHI):   9.0 /hour, RDI was 10.8 /hr.  Average Oxygen Saturation SpO2: 94 %; Lowest Oxygen Saturation: SpO2 75 %  Time Oxygen Saturation Below 88%:  6 minutes = 2 % Average Heart Rate:  60 bpm IMPRESSION: Mild and uncomplicated sleep apnea, mostly obstructive. No associated hypoxemia. RECOMMENDATION: The patient could try auto titration CPAP, but the most effective way for her apnea reduction will be weight loss. She has more snoring than apnea arousals, may benefit from a dental device. CPAP will be an option, but  requires compliance.  I certify that I have reviewed the raw data recording prior to the issuance of this report in accordance with the standards of the American Academy of Sleep Medicine (AASM).   Larey Seat, MD   08-16-2017  Medical Director of [facility] at [facility],  [place] of the  ABPN and ABSM Member of and accredited by AASM

## 2017-08-20 ENCOUNTER — Telehealth: Payer: Self-pay | Admitting: Neurology

## 2017-08-20 ENCOUNTER — Other Ambulatory Visit: Payer: Self-pay | Admitting: Neurology

## 2017-08-20 DIAGNOSIS — G4733 Obstructive sleep apnea (adult) (pediatric): Secondary | ICD-10-CM

## 2017-08-20 NOTE — Telephone Encounter (Signed)
-----   Message from Larey Seat, MD sent at 08/16/2017  1:56 PM EDT ----- There was mild sleep apnea seen, moderate snoring and no hypoxemia and/ or tachy brady responses. This patient could benefit from auto CPAP, but would benefit the most from weight loss. I am concerned that her depression causes some additional symptoms and that she had not started on medication.  I will offer her an auto CPAP 5-12 cm water , with 2 cm EPR- if she prefers CPAP. Please call her .   The other options are ; dental device, ENT surgery, M[facility] referral to Dr Leafy Ro . CD

## 2017-08-20 NOTE — Telephone Encounter (Signed)
Called patient and went over sleep study results. I informed the patient that Dr Brett Fairy found mild sleep apnea and is recommending weight loss and auto cpap. She did offer other options such as a dental device. Pt was agreeable to dental device. She has a Pharmacist, community but is unsure if the offer the dental devices there at her practice. Pt will contact her dentist office and ask if they do this. If so she will call us back with the information so that we can place this referral in for her. Pt verbalized understanding of the sleep study and had no further questions.

## 2017-08-20 NOTE — Telephone Encounter (Signed)
Called patient back and discussed the dental device in more detail. Her dentist does provide these. I will place referral to [facility] for dental device

## 2017-08-20 NOTE — Telephone Encounter (Signed)
Patient calling to get more information about the dental device.

## 2017-09-06 DIAGNOSIS — Z0000 Encounter for general adult medical examination without abnormal findings: Secondary | ICD-10-CM | POA: Diagnosis not present

## 2017-09-06 DIAGNOSIS — E039 Hypothyroidism, unspecified: Secondary | ICD-10-CM | POA: Diagnosis not present

## 2017-09-06 DIAGNOSIS — N390 Urinary tract infection, site not specified: Secondary | ICD-10-CM | POA: Diagnosis not present

## 2017-09-06 DIAGNOSIS — E559 Vitamin D deficiency, unspecified: Secondary | ICD-10-CM | POA: Diagnosis not present

## 2017-09-08 NOTE — Progress Notes (Signed)
[facility]  PATIENT: Carolyn Mcguire DOB: November 25, 1956   REASON FOR VISIT: Follow-up for headache, new complaint daytime somnolence HISTORY FROM: Patient    HISTORY OF PRESENT ILLNESS:UPDATE 10/15/2018[name], 61 year old female returns for follow-up with a history of daily headaches tension type with migraine features. She has a lot of stress in her life but does not want to be on an antidepressant. When last seen she was given Imitrex to try acutely for her migraines however she felt it made her feel worse. She has been diagnosed with mild sleep apnea and instead of using CPAP she opted for a dental appliance. This referral was sent to her dentist but she has not heard anything. She has a history of hypertension and hyperlipidemia. She is aware that both of these conditions would benefit from her losing weight and exercising. She returns for reevaluation   UPDATE 07/13/2018CM Ms Mcguire, 61 year old female returns for follow-up. She has a history of daily headaches tension type with some migraine features. Due to the significant stressors in her life she was placed on Effexor by Dr. [name], however she never got the prescription filled and does not want to be on antidepressant. She had multiple family members pass away in short period of time. Today she also complains of daytime drowsiness she is morbidly obese and snores. She has a history of hypertension and hyperlipidemia. MRI of the brain in 2017 was normal. She returns for reevaluation.  HISTORY 02/27/17 Carolyn Mcguire is a 61 years old right-handed female, seen in refer by  her primary care doctor [name] for evaluation of headache, initial evaluation was on February 27 2017. She has gone through stressful few years, multiple close family member passed away in a short period of time, she had depression anxiety, around 2016, she began to have frequent headaches, vortex, occipital region pressure headaches, at the same  time she had constant daily pulsatile tinnitus, Her headache has become daily, today she reported 6/10 headache,, she is only taking Tylenol as needed, she also complains of depression anxiety, Trigger for her headaches are exercise, stress, weather change, sleep deprivation, hot, noise, smell, perfume, smoke  Over the years, she was seen by ENT, and other specialists  I reviewed the records she brought from ENT [name], August 01 2016, audiogram in October 2013 was unremarkable, discrimination score was 96%,  MRI of the brain on Apr 25 2016 from [facility], unremarkable MRI of the brain and temporal bone,  [facility] orthopedic evaluation on December 07 2016, complained of neck pain, x-ray showed mild degenerative changes and facet disease at multiple levels, physical therapy was ordered, MRI of cervical spine was ordered, I also reviewed the record from [facility] tinnitus evaluation, findings indicate hearing within normal limits in both ear, with a steeply sloping hearing loss in the extended high frequency only, 12.5-16 K hertz, speech recognition score was excellent, notably like stress and the psychological condition such as anxiety or depression cane exacerbate her symptoms, interfere with treatment, in her case, stress likely is the aggravating factors that contributes to her tinnitus disturbance I reviewed and summarized referring note, she had a history of hypertension, hyperlipidemia, had a history of right hemithyroidectomy, for benign thyroid nodule, Laboratory evaluations in March 2018, normal BMP, CBC, TSH,  I personally reviewed cervical x-ray, mild degenerative changes   REVIEW OF SYSTEMS: Full 14 system review of systems performed and notable only for those listed, all others are neg:  Constitutional: neg  Cardiovascular: neg Ear/Nose/Throat: Ringing in the ears Skin: neg Eyes: neg Respiratory: neg Gastroitestinal: neg  Hematology/Lymphatic: neg    Endocrine: Intolerance to heat Musculoskeletal: Joint pain Allergy/Immunology: neg Neurological: Headache Psychiatric: neg Sleep : neg  ALLERGIES: Allergies  Allergen Reactions  . Sulfur Rash    MAKES PT RED    HOME MEDICATIONS: Outpatient Medications Prior to Visit  Medication Sig Dispense Refill  . levothyroxine (SYNTHROID, LEVOTHROID) 88 MCG tablet Take 88 mcg by mouth daily before breakfast.    . losartan (COZAAR) 100 MG tablet Take 100 mg by mouth daily. as directed  3  . metoprolol succinate (TOPROL XL) 25 MG 24 hr tablet Take 1 tablet (25 mg total) by mouth daily. 90 tablet 3  . Multiple Vitamin (MULTIVITAMIN) tablet Take 1 tablet by mouth daily. GUMMIES    . rosuvastatin (CRESTOR) 10 MG tablet Take 10 mg by mouth daily.    Marland Kitchen Specialty Vitamins Products (JOINT/BONE VITALITY PO) Take 2 oz by mouth daily.    . SUMAtriptan (IMITREX) 50 MG tablet Take 1 tablet (50 mg total) by mouth every 2 (two) hours as needed for migraine. May repeat in 2 hours if headache persists or recurs. 15 tablet 6   No facility-administered medications prior to visit.     PAST MEDICAL HISTORY: Past Medical History:  Diagnosis Date  . Adenomatous colon polyp   . Allergic rhinitis   . Animal bite 05/2015   fox   . Anxiety   . Arthritis    shoulder  . Carpal tunnel syndrome    RIGHT HAND  . Depression   . Diastolic dysfunction    a. 2D echo 09/08/15: EF 55-60%, grade 1 DD, elevated LVEDP, mild MR, mild TR. - normal BNP 08/2015.  Marland Kitchen Essential hypertension   . GERD (gastroesophageal reflux disease)   . Headache   . Heart murmur   . Hepatic steatosis   . Hyperlipidemia   . Hypothyroidism   . LBBB (left bundle branch block)    a. negative nuclear stress test 06/2011.  . Migraines   . Obesity   . OSA (obstructive sleep apnea)    mild    PAST SURGICAL HISTORY: Past Surgical History:  Procedure Laterality Date  . DERMOID CYST  EXCISION     OF HER RIGHT OVARY. SIZE OF A GRAPEFRUIT. OVARY  IS STILL THERE.  Marland Kitchen HEMITHYROIDECTOMY    . RUPTURE BELLY BUTTON     WHEN SHE WAS AN INFANT  . TONSILECTOMY, ADENOIDECTOMY, BILATERAL MYRINGOTOMY AND TUBES     AGE 70, pt states she didn't have tubes put into her ears    FAMILY HISTORY: Family History  Problem Relation Age of Onset  . Ovarian cancer Mother        dx. 83-59; TAH-BSO at 53  . Diabetes Mother   . Pancreatic cancer Mother 74       d. 45  . Skin cancer Mother        maybe basal cell carcinoma  . Heart disease Father   . Bladder Cancer Father        dx 28-81  . Skin cancer Father        possibly melanoma; +sun exposure  . Cancer Brother 12       salivary gland cancer; d. 77; no tobacco use  . Colon cancer Maternal Uncle        dx late 46s; d. early 63s  . Ovarian cancer Maternal Grandmother        d.  77; w/ mets  . Breast cancer Maternal Aunt        dx unspecified age; s/p mastectomy  . Other Maternal Aunt        intellectual disabilities  . Cancer Paternal Uncle        CLL, dx. 82  . Heart attack Maternal Grandfather        d. 14  . Heart attack Maternal Uncle 51  . Heart failure Maternal Aunt 86       acute diastolic heart failure  . Esophageal cancer Neg Hx   . Rectal cancer Neg Hx   . Stomach cancer Neg Hx     SOCIAL HISTORY: Social History   Social History  . Marital status: Single    Spouse name: N/A  . Number of children: 0  . Years of education: HS   Occupational History  . Unemployed    Social History Main Topics  . Smoking status: Never Smoker  . Smokeless tobacco: Never Used  . Alcohol use No  . Drug use: No  . Sexual activity: Not on file   Other Topics Concern  . Not on file   Social History Narrative   Lives at home alone.   Right-handed.   She does not drink caffeine.  She will occasionally eat chocolate.     PHYSICAL EXAM  Vitals:   09/09/17 1011  BP: (!) 150/67  Pulse: 63  Weight: 243 lb (110.2 kg)  Height: 5\' 4"  (1.626 m)   Body mass index is 41.71  kg/m.  Generalized: Well developed,Obese female in no acute distress  Head: normocephalic and atraumatic,. Oropharynx benign  Neck: Supple,  Musculoskeletal: No deformity   Neurological examination   Mentation: Alert oriented to time, place, history taking. Attention span and concentration appropriate. Recent and remote memory intact.  Follows all commands speech and language fluent.   Cranial nerve II-XII: .Pupils were equal round reactive to light extraocular movements were full, visual field were full on confrontational test. Facial sensation and strength were normal. hearing was intact to finger rubbing bilaterally. Uvula tongue midline. head turning and shoulder shrug were normal and symmetric.Tongue protrusion into cheek strength was normal. Motor: normal bulk and tone, full strength in the BUE, BLE, fine finger movements normal, no pronator drift. No focal weakness Sensory: normal and symmetric to light touch, on the face arms and legs Coordination: finger-nose-finger, heel-to-shin bilaterally, no dysmetria Reflexes: Symmetric upper and lower, plantar responses were flexor bilaterally. Gait and Station: Rising up from seated position without assistance, normal stance,  moderate stride, good arm swing, smooth turning, able to perform tiptoe, and heel walking without difficulty. Tandem gait is steady. No assistive device  DIAGNOSTIC DATA (LABS, IMAGING, TESTING) - I reviewed patient records, labs, notes, testing and imaging myself where available.  Lab Results  Component Value Date   WBC 8.5 09/16/2015   HGB 14.4 09/16/2015   HCT 40.1 09/16/2015   MCV 83.9 09/16/2015   PLT 213 09/16/2015      Component Value Date/Time   NA 142 01/25/2017 1147   K 4.2 01/25/2017 1147   CL 100 01/25/2017 1147   CO2 23 01/25/2017 1147   GLUCOSE 93 01/25/2017 1147   GLUCOSE 89 08/02/2016 1127   BUN 20 01/25/2017 1147   CREATININE 0.97 01/25/2017 1147   CREATININE 0.84 08/02/2016 1127    CALCIUM 9.1 01/25/2017 1147   PROT 7.2 09/16/2015 1026   ALBUMIN 4.4 09/16/2015 1026   AST 18 09/16/2015 1026   ALT  15 09/16/2015 1026   ALKPHOS 85 09/16/2015 1026   BILITOT 0.9 09/16/2015 1026   GFRNONAA 65 01/25/2017 1147   GFRAA 74 01/25/2017 1147    ASSESSMENT AND PLAN  61 y.o. year old female  has a past medical history of  Allergic rhinitis;  Anxiety; Arthritis; Carpal tunnel syndrome; Depression; Diastolic dysfunction; Essential hypertension;  Headache;  Hepatic steatosis; Hyperlipidemia; Hypothyroidism; LBBB (left bundle branch block); Migraines; Obesity; here for follow-up for her headaches . Sleep study shows mild obstructive sleep apnea and she opted for dental appliance .                 Discontinue  Imitrex acutely Try Maxalt 10mg  MLT prn acutely Continue to avoid  migraine triggers  Referral sent to dentist on 9/25 for dental appliance for mild OSA Call for worsening symptoms Follow-up with me in 4 months Dennie Bible, [facility], [facility], [facility] [address] [place], [place] [address] [phone]

## 2017-09-09 ENCOUNTER — Ambulatory Visit (INDEPENDENT_AMBULATORY_CARE_PROVIDER_SITE_OTHER): Payer: BLUE CROSS/BLUE SHIELD | Admitting: Nurse Practitioner

## 2017-09-09 ENCOUNTER — Encounter: Payer: Self-pay | Admitting: Nurse Practitioner

## 2017-09-09 VITALS — BP 150/67 | HR 63 | Ht 64.0 in | Wt 243.0 lb

## 2017-09-09 DIAGNOSIS — R51 Headache: Secondary | ICD-10-CM

## 2017-09-09 DIAGNOSIS — R519 Headache, unspecified: Secondary | ICD-10-CM

## 2017-09-09 DIAGNOSIS — G4733 Obstructive sleep apnea (adult) (pediatric): Secondary | ICD-10-CM

## 2017-09-09 MED ORDER — RIZATRIPTAN BENZOATE 10 MG PO TBDP: 10.0000 mg | ORAL_TABLET | ORAL | 2 refills | Status: DC | PRN

## 2017-09-09 NOTE — Patient Instructions (Signed)
Discontinue  Imitrex acutely Try Maxalt 10mg  MLT prn acutely Continue to avoid  migraine triggers  Referral sent to dentist on 9/25 for dental appliance for mild OSA Call for worsening symptoms Follow-up with me in 4 months

## 2017-09-10 NOTE — Progress Notes (Signed)
I have reviewed and agreed above plan. 

## 2017-09-11 DIAGNOSIS — Z0001 Encounter for general adult medical examination with abnormal findings: Secondary | ICD-10-CM | POA: Diagnosis not present

## 2017-09-11 DIAGNOSIS — E7800 Pure hypercholesterolemia, unspecified: Secondary | ICD-10-CM | POA: Diagnosis not present

## 2017-09-11 DIAGNOSIS — E049 Nontoxic goiter, unspecified: Secondary | ICD-10-CM | POA: Diagnosis not present

## 2017-09-11 DIAGNOSIS — E039 Hypothyroidism, unspecified: Secondary | ICD-10-CM | POA: Diagnosis not present

## 2017-09-11 DIAGNOSIS — Z23 Encounter for immunization: Secondary | ICD-10-CM | POA: Diagnosis not present

## 2017-09-20 ENCOUNTER — Telehealth: Payer: Self-pay | Admitting: Nurse Practitioner

## 2017-09-20 NOTE — Telephone Encounter (Signed)
Received documentation from Dr. Pennie Banter  Office recent labs drawn 09/06/2017 CMP within normal limits.  Lipid panel total cholesterol 191, HDL 68 LDL 97 Vitamin D low at 23 CBC within normal limits

## 2017-10-22 DIAGNOSIS — D1801 Hemangioma of skin and subcutaneous tissue: Secondary | ICD-10-CM | POA: Diagnosis not present

## 2017-10-22 DIAGNOSIS — D225 Melanocytic nevi of trunk: Secondary | ICD-10-CM | POA: Diagnosis not present

## 2017-10-22 DIAGNOSIS — L814 Other melanin hyperpigmentation: Secondary | ICD-10-CM | POA: Diagnosis not present

## 2017-10-22 DIAGNOSIS — L821 Other seborrheic keratosis: Secondary | ICD-10-CM | POA: Diagnosis not present

## 2018-01-10 ENCOUNTER — Ambulatory Visit: Payer: BLUE CROSS/BLUE SHIELD | Admitting: Nurse Practitioner

## 2018-01-20 DIAGNOSIS — F5104 Psychophysiologic insomnia: Secondary | ICD-10-CM | POA: Diagnosis not present

## 2018-01-20 DIAGNOSIS — F329 Major depressive disorder, single episode, unspecified: Secondary | ICD-10-CM | POA: Diagnosis not present

## 2018-01-20 DIAGNOSIS — I10 Essential (primary) hypertension: Secondary | ICD-10-CM | POA: Diagnosis not present

## 2018-01-28 DIAGNOSIS — I10 Essential (primary) hypertension: Secondary | ICD-10-CM | POA: Diagnosis not present

## 2018-02-10 NOTE — Progress Notes (Signed)
[facility]  PATIENT: Carolyn Mcguire DOB: 07-06-1956   REASON FOR VISIT: Follow-up for headache,  daytime somnolence HISTORY FROM: Patient    HISTORY OF PRESENT ILLNESS:UPDATE3/20/19 CM  Carolyn Mcguire, 62 year old female returns for follow-up with history of daily headaches tension type with migraine features.  She continues to her ears.  We reviewed the records from [facility] tendon evaluation,findings indicate hearing within normal limits in both ear, with a steeply sloping hearing loss in the extended high frequency only, 12.5-16 K hertz, speech recognition score was excellent, notably like stress and the psychological condition such as anxiety or depression cane exacerbate her symptoms, interfere with treatment, in her case, stress likely is the aggravating factors that contributes to her tinnitus disturbance.  She feels Maxalt works better for her headaches and Imitrex and she uses it 2-4 times a month.  Many times when she gets a headache she just uses an ice pack for about 10 minutes and it goes away.  She remains on Toprol 25 daily.  Sometimes her headaches start in the back of the neck move up.  She had gotten a prescription for some physical therapy but could not afford to pay the co-pay.  Sleep study positive for mild obstructive sleep apnea however she could not afford the dental appliance to treat this.  She does say she walks every day for exercise she remains overweight she returns for reevaluation.  UPDATE 10/15/2018[name], 62 year old female returns for follow-up with a history of daily headaches tension type with migraine features. She has a lot of stress in her life but does not want to be on an antidepressant. When last seen she was given Imitrex to try acutely for her migraines however she felt it made her feel worse. She has been diagnosed with mild sleep apnea and instead of using CPAP she opted for a dental appliance. This referral was sent to her dentist but  she has not heard anything. She has a history of hypertension and hyperlipidemia. She is aware that both of these conditions would benefit from her losing weight and exercising. She returns for reevaluation   UPDATE 07/13/2018[name], 62 year old female returns for follow-up. She has a history of daily headaches tension type with some migraine features. Due to the significant stressors in her life she was placed on Effexor by Dr. [name], however she never got the prescription filled and does not want to be on antidepressant. She had multiple family members pass away in short period of time. Today she also complains of daytime drowsiness she is morbidly obese and snores. She has a history of hypertension and hyperlipidemia. MRI of the brain in 2017 was normal. She returns for reevaluation.  HISTORY 02/27/17 Carolyn Mcguire is a 62 years old right-handed female, seen in refer by  her primary care doctor [name] for evaluation of headache, initial evaluation was on February 27 2017. She has gone through stressful few years, multiple close family member passed away in a short period of time, she had depression anxiety, around 2016, she began to have frequent headaches, vortex, occipital region pressure headaches, at the same time she had constant daily pulsatile tinnitus, Her headache has become daily, today she reported 6/10 headache,, she is only taking Tylenol as needed, she also complains of depression anxiety, Trigger for her headaches are exercise, stress, weather change, sleep deprivation, hot, noise, smell, perfume, smoke  Over the years, she was seen by ENT, and other specialists  I reviewed the  records she brought from ENT [name], August 01 2016, audiogram in October 2013 was unremarkable, discrimination score was 96%,  MRI of the brain on Apr 25 2016 from [facility], unremarkable MRI of the brain and temporal bone,  [facility] orthopedic evaluation on December 07 2016,  complained of neck pain, x-ray showed mild degenerative changes and facet disease at multiple levels, physical therapy was ordered, MRI of cervical spine was ordered, I also reviewed the record from [facility] tinnitus evaluation, findings indicate hearing within normal limits in both ear, with a steeply sloping hearing loss in the extended high frequency only, 12.5-16 K hertz, speech recognition score was excellent, notably like stress and the psychological condition such as anxiety or depression cane exacerbate her symptoms, interfere with treatment, in her case, stress likely is the aggravating factors that contributes to her tinnitus disturbance I reviewed and summarized referring note, she had a history of hypertension, hyperlipidemia, had a history of right hemithyroidectomy, for benign thyroid nodule, Laboratory evaluations in March 2018, normal BMP, CBC, TSH,  I personally reviewed cervical x-ray, mild degenerative changes   REVIEW OF SYSTEMS: Full 14 system review of systems performed and notable only for those listed, all others are neg:  Constitutional: Fatigue Cardiovascular: neg Ear/Nose/Throat: Ringing in the ears Skin: neg Eyes: neg Respiratory: neg Gastroitestinal: neg  Hematology/Lymphatic: neg  Endocrine:neg Musculoskeletal: Joint pain Allergy/Immunology: neg Neurological: Headache Psychiatric: neg Sleep : neg  ALLERGIES: Allergies  Allergen Reactions  . Sulfur Rash    MAKES PT RED    HOME MEDICATIONS: Outpatient Medications Prior to Visit  Medication Sig Dispense Refill  . Cholecalciferol (VITAMIN D3) 1000 units CAPS Take by mouth. One daily    . hydrochlorothiazide (HYDRODIURIL) 25 MG tablet 12.5 mg daily.  4  . levothyroxine (SYNTHROID, LEVOTHROID) 88 MCG tablet Take 88 mcg by mouth daily before breakfast.    . losartan (COZAAR) 100 MG tablet Take 100 mg by mouth daily. as directed  3  . metoprolol succinate (TOPROL XL) 25 MG 24 hr tablet Take 1 tablet (25 mg  total) by mouth daily. 90 tablet 3  . Multiple Vitamin (MULTIVITAMIN) tablet Take 1 tablet by mouth daily. GUMMIES    . rizatriptan (MAXALT-MLT) 10 MG disintegrating tablet Take 1 tablet (10 mg total) by mouth as needed for migraine. May repeat in 2 hours if needed 10 tablet 2  . rosuvastatin (CRESTOR) 10 MG tablet Take 10 mg by mouth daily.    Marland Kitchen Specialty Vitamins Products (JOINT/BONE VITALITY PO) Take 2 oz by mouth daily.     No facility-administered medications prior to visit.     PAST MEDICAL HISTORY: Past Medical History:  Diagnosis Date  . Adenomatous colon polyp   . Allergic rhinitis   . Animal bite 05/2015   fox   . Anxiety   . Arthritis    shoulder  . Carpal tunnel syndrome    RIGHT HAND  . Depression   . Diastolic dysfunction    a. 2D echo 09/08/15: EF 55-60%, grade 1 DD, elevated LVEDP, mild MR, mild TR. - normal BNP 08/2015.  Marland Kitchen Essential hypertension   . GERD (gastroesophageal reflux disease)   . Headache   . Heart murmur   . Hepatic steatosis   . Hyperlipidemia   . Hypothyroidism   . LBBB (left bundle branch block)    a. negative nuclear stress test 06/2011.  . Migraines   . Obesity   . OSA (obstructive sleep apnea)    mild  PAST SURGICAL HISTORY: Past Surgical History:  Procedure Laterality Date  . DERMOID CYST  EXCISION     OF HER RIGHT OVARY. SIZE OF A GRAPEFRUIT. OVARY IS STILL THERE.  Marland Kitchen HEMITHYROIDECTOMY    . RUPTURE BELLY BUTTON     WHEN SHE WAS AN INFANT  . TONSILECTOMY, ADENOIDECTOMY, BILATERAL MYRINGOTOMY AND TUBES     AGE 36, pt states she didn't have tubes put into her ears    FAMILY HISTORY: Family History  Problem Relation Age of Onset  . Ovarian cancer Mother        dx. 79-59; TAH-BSO at 32  . Diabetes Mother   . Pancreatic cancer Mother 40       d. 86  . Skin cancer Mother        maybe basal cell carcinoma  . Heart disease Father   . Bladder Cancer Father        dx 83-81  . Skin cancer Father        possibly melanoma; +sun  exposure  . Cancer Brother 48       salivary gland cancer; d. 55; no tobacco use  . Colon cancer Maternal Uncle        dx late 55s; d. early 17s  . Ovarian cancer Maternal Grandmother        d. 50; w/ mets  . Breast cancer Maternal Aunt        dx unspecified age; s/p mastectomy  . Other Maternal Aunt        intellectual disabilities  . Cancer Paternal Uncle        CLL, dx. 79  . Heart attack Maternal Grandfather        d. 17  . Heart attack Maternal Uncle 51  . Heart failure Maternal Aunt 86       acute diastolic heart failure  . Esophageal cancer Neg Hx   . Rectal cancer Neg Hx   . Stomach cancer Neg Hx     SOCIAL HISTORY: Social History   Socioeconomic History  . Marital status: Single    Spouse name: Not on file  . Number of children: 0  . Years of education: HS  . Highest education level: Not on file  Social Needs  . Financial resource strain: Not on file  . Food insecurity - worry: Not on file  . Food insecurity - inability: Not on file  . Transportation needs - medical: Not on file  . Transportation needs - non-medical: Not on file  Occupational History  . Occupation: Unemployed  Tobacco Use  . Smoking status: Never Smoker  . Smokeless tobacco: Never Used  Substance and Sexual Activity  . Alcohol use: No  . Drug use: No  . Sexual activity: Not on file  Other Topics Concern  . Not on file  Social History Narrative   Lives at home alone.   Right-handed.   She does not drink caffeine.  She will occasionally eat chocolate.     PHYSICAL EXAM  Vitals:   02/12/18 1258  BP: 134/64  Pulse: 79  Weight: 250 lb 6.4 oz (113.6 kg)   Body mass index is 42.98 kg/m.  Generalized: Well developed,Obese female in no acute distress  Head: normocephalic and atraumatic,. Oropharynx benign  Neck: Supple, paraspinals tender to palpation Musculoskeletal: No deformity   Neurological examination   Mentation: Alert oriented to time, place, history taking. Attention  span and concentration appropriate. Recent and remote memory intact.  Follows all commands speech and  language fluent.   Cranial nerve II-XII: .Pupils were equal round reactive to light extraocular movements were full, visual field were full on confrontational test. Facial sensation and strength were normal. hearing was intact to finger rubbing bilaterally. Uvula tongue midline. head turning and shoulder shrug were normal and symmetric.Tongue protrusion into cheek strength was normal. Motor: normal bulk and tone, full strength in the BUE, BLE, fine finger movements normal, no pronator drift. No focal weakness Sensory: normal and symmetric to light touch, on the face arms and legs Coordination: finger-nose-finger, heel-to-shin bilaterally, no dysmetria Reflexes: Symmetric upper and lower, plantar responses were flexor bilaterally. Gait and Station: Rising up from seated position without assistance, normal stance,  moderate stride, good arm swing, smooth turning, able to perform tiptoe, and heel walking without difficulty. Tandem gait is steady. No assistive device  DIAGNOSTIC DATA (LABS, IMAGING, TESTING) - I reviewed patient records, labs, notes, testing and imaging myself where available.  Lab Results  Component Value Date   WBC 8.5 09/16/2015   HGB 14.4 09/16/2015   HCT 40.1 09/16/2015   MCV 83.9 09/16/2015   PLT 213 09/16/2015      Component Value Date/Time   NA 142 01/25/2017 1147   K 4.2 01/25/2017 1147   CL 100 01/25/2017 1147   CO2 23 01/25/2017 1147   GLUCOSE 93 01/25/2017 1147   GLUCOSE 89 08/02/2016 1127   BUN 20 01/25/2017 1147   CREATININE 0.97 01/25/2017 1147   CREATININE 0.84 08/02/2016 1127   CALCIUM 9.1 01/25/2017 1147   PROT 7.2 09/16/2015 1026   ALBUMIN 4.4 09/16/2015 1026   AST 18 09/16/2015 1026   ALT 15 09/16/2015 1026   ALKPHOS 85 09/16/2015 1026   BILITOT 0.9 09/16/2015 1026   GFRNONAA 65 01/25/2017 1147   GFRAA 74 01/25/2017 1147    ASSESSMENT AND  PLAN  62 y.o. year old female  has a past medical history of  Allergic rhinitis;  Anxiety; Arthritis; Carpal tunnel syndrome; Depression; Diastolic dysfunction; Essential hypertension;  Headache;  Hepatic steatosis; Hyperlipidemia; Hypothyroidism; LBBB (left bundle branch block); Migraines; Obesity; here for follow-up for her headaches . Sleep study shows mild obstructive sleep apnea and she opted for dental appliance, but was unable to afford.                 Continue Maxalt 10mg  MLT prn acutely will refill Continue to avoid  migraine triggers  Unable to afford  dental appliance for mild OSA Given neck exercises to perform every day Continue walking for overall health and well-being Weight loss your overall medical condition as well as your obstructive sleep apnea Call for worsening symptoms Follow-up with me in 6 to 8 months I spent 25 minutes in total face to face time with the patient more than 50% of which was spent counseling and coordination of care, reviewing test results reviewing medications and discussing and reviewing the diagnosis of migraine and neck pain and further treatment options.  Patient was given written information on both of these., Dennie Bible, [facility], [facility], APRN  [facility] [address], [place], [place] [address] [phone]

## 2018-02-12 ENCOUNTER — Ambulatory Visit: Payer: BLUE CROSS/BLUE SHIELD | Admitting: Nurse Practitioner

## 2018-02-12 ENCOUNTER — Encounter: Payer: Self-pay | Admitting: Nurse Practitioner

## 2018-02-12 VITALS — BP 134/64 | HR 79 | Wt 250.4 lb

## 2018-02-12 DIAGNOSIS — R519 Headache, unspecified: Secondary | ICD-10-CM

## 2018-02-12 DIAGNOSIS — G4733 Obstructive sleep apnea (adult) (pediatric): Secondary | ICD-10-CM

## 2018-02-12 DIAGNOSIS — E6609 Other obesity due to excess calories: Secondary | ICD-10-CM | POA: Diagnosis not present

## 2018-02-12 DIAGNOSIS — R51 Headache: Secondary | ICD-10-CM

## 2018-02-12 MED ORDER — RIZATRIPTAN BENZOATE 10 MG PO TBDP: 10.0000 mg | ORAL_TABLET | ORAL | 8 refills | Status: DC | PRN

## 2018-02-12 NOTE — Patient Instructions (Addendum)
Continue Maxalt 10mg  MLT prn acutely will refill Continue to avoid  migraine triggers  Unable to afford  dental appliance for mild OSA Given neck exercises Continue walking for overall health and well-being Call for worsening symptoms Follow-up with me in 6 to 8 months   Neck Exercises Neck exercises can be important for many reasons:  They can help you to improve and maintain flexibility in your neck. This can be especially important as you age.  They can help to make your neck stronger. This can make movement easier.  They can reduce or prevent neck pain.  They may help your upper back.  Ask your health care provider which neck exercises would be best for you. Exercises Neck Press Repeat this exercise 10 times. Do it first thing in the morning and right before bed or as told by your health care provider. 1. Lie on your back on a firm bed or on the floor with a pillow under your head. 2. Use your neck muscles to push your head down on the pillow and straighten your spine. 3. Hold the position as well as you can. Keep your head facing up and your chin tucked. 4. Slowly count to 5 while holding this position. 5. Relax for a few seconds. Then repeat.  Isometric Strengthening Do a full set of these exercises 2 times a day or as told by your health care provider. 1. Sit in a supportive chair and place your hand on your forehead. 2. Push forward with your head and neck while pushing back with your hand. Hold for 10 seconds. 3. Relax. Then repeat the exercise 3 times. 4. Next, do thesequence again, this time putting your hand against the back of your head. Use your head and neck to push backward against the hand pressure. 5. Finally, do the same exercise on either side of your head, pushing sideways against the pressure of your hand.  Prone Head Lifts Repeat this exercise 5 times. Do this 2 times a day or as told by your health care provider. 1. Lie face-down, resting on your  elbows so that your chest and upper back are raised. 2. Start with your head facing downward, near your chest. Position your chin either on or near your chest. 3. Slowly lift your head upward. Lift until you are looking straight ahead. Then continue lifting your head as far back as you can stretch. 4. Hold your head up for 5 seconds. Then slowly lower it to your starting position.  Supine Head Lifts Repeat this exercise 8-10 times. Do this 2 times a day or as told by your health care provider. 1. Lie on your back, bending your knees to point to the ceiling and keeping your feet flat on the floor. 2. Lift your head slowly off the floor, raising your chin toward your chest. 3. Hold for 5 seconds. 4. Relax and repeat.  Scapular Retraction Repeat this exercise 5 times. Do this 2 times a day or as told by your health care provider. 1. Stand with your arms at your sides. Look straight ahead. 2. Slowly pull both shoulders backward and downward until you feel a stretch between your shoulder blades in your upper back. 3. Hold for 10-30 seconds. 4. Relax and repeat.  Contact a health care provider if:  Your neck pain or discomfort gets much worse when you do an exercise.  Your neck pain or discomfort does not improve within 2 hours after you exercise. If you have any of  these problems, stop exercising right away. Do not do the exercises again unless your health care provider says that you can. Get help right away if:  You develop sudden, severe neck pain. If this happens, stop exercising right away. Do not do the exercises again unless your health care provider says that you can. Exercises Neck Stretch  Repeat this exercise 3-5 times. 1. Do this exercise while standing or while sitting in a chair. 2. Place your feet flat on the floor, shoulder-width apart. 3. Slowly turn your head to the right. Turn it all the way to the right so you can look over your right shoulder. Do not tilt or tip your  head. 4. Hold this position for 10-30 seconds. 5. Slowly turn your head to the left, to look over your left shoulder. 6. Hold this position for 10-30 seconds.  Neck Retraction Repeat this exercise 8-10 times. Do this 3-4 times a day or as told by your health care provider. 1. Do this exercise while standing or while sitting in a sturdy chair. 2. Look straight ahead. Do not bend your neck. 3. Use your fingers to push your chin backward. Do not bend your neck for this movement. Continue to face straight ahead. If you are doing the exercise properly, you will feel a slight sensation in your throat and a stretch at the back of your neck. 4. Hold the stretch for 1-2 seconds. Relax and repeat.  This information is not intended to replace advice given to you by your health care provider. Make sure you discuss any questions you have with your health care provider. Document Released: 10/24/2015 Document Revised: 04/19/2016 Document Reviewed: 05/23/2015 Elsevier Interactive Patient Education  2018 [employer].  Recurrent Migraine Headache A migraine headache is very bad, throbbing pain that is usually on one side of your head. Recurrent migraines keep coming back (recurring). Talk with your doctor about what things may bring on (trigger) your migraine headaches. Follow these instructions at home: Medicines  Take over-the-counter and prescription medicines only as told by your doctor.  Do not drive or use heavy machinery while taking prescription pain medicine. Lifestyle  Do not use any products that contain nicotine or tobacco, such as cigarettes and e-cigarettes. If you need help quitting, ask your doctor.  Limit alcohol intake to no more than 1 drink a day for nonpregnant women and 2 drinks a day for men. One drink equals 12 oz of beer, 5 oz of wine, or 1 oz of hard liquor.  Get 7-9 hours of sleep each night.  Lessen any stress in your life. Ask your doctor about ways to lower your  stress.  Stay at a healthy weight. Talk with your doctor if you need help losing weight.  Get regular exercise. General instructions  Keep a journal to find out if certain things bring on migraine headaches. For example, write down: ? What you eat and drink. ? How much sleep you get. ? Any change to your diet or medicines.  Lie down in a dark, quiet room when you have a migraine.  Try placing a cool towel over your head when you have a migraine.  Keep lights dim if bright lights bother you or make your migraines worse.  Keep all follow-up visits as told by your doctor. This is important. Contact a doctor if:  Medicine does not help your migraines.  Your pain keeps coming back.  You have a fever.  You have weight loss without trying. Get help  right away if:  Your migraine becomes really bad and medicine does not help.  You have a stiff neck.  You have trouble seeing.  Your muscles are weak or you lose control of your muscles.  You lose your balance or have trouble walking.  You feel like you will pass out (faint) or you pass out.  You have really bad symptoms that are different than your first symptoms.  You start having sudden, very bad headaches that last for one second or less, like a thunderclap. Summary  A migraine headache is very bad, throbbing pain that is usually on one side of your head.  Talk with your doctor about what things may bring on (trigger) your migraine headaches.  Take over-the-counter and prescription medicines only as told by your doctor.  Lie down in a dark, quiet room when you have a migraine.  Keep a journal about what you eat and drink, how much sleep you get, and any changes to your medicines. This can help you find out if certain things make you have migraine headaches. This information is not intended to replace advice given to you by your health care provider. Make sure you discuss any questions you have with your health care  provider. Document Released: 08/21/2008 Document Revised: 10/05/2016 Document Reviewed: 10/05/2016 Elsevier Interactive Patient Education  2017 [employer].

## 2018-02-13 NOTE — Progress Notes (Signed)
I have reviewed and agreed above plan. 

## 2018-02-18 ENCOUNTER — Encounter: Payer: Self-pay | Admitting: Cardiovascular Disease

## 2018-02-28 ENCOUNTER — Ambulatory Visit: Payer: BLUE CROSS/BLUE SHIELD | Admitting: Cardiovascular Disease

## 2018-02-28 ENCOUNTER — Encounter: Payer: Self-pay | Admitting: Cardiovascular Disease

## 2018-02-28 VITALS — BP 154/82 | HR 69 | Ht 64.0 in | Wt 250.4 lb

## 2018-02-28 DIAGNOSIS — I10 Essential (primary) hypertension: Secondary | ICD-10-CM

## 2018-02-28 DIAGNOSIS — I447 Left bundle-branch block, unspecified: Secondary | ICD-10-CM | POA: Diagnosis not present

## 2018-02-28 MED ORDER — POTASSIUM CHLORIDE ER 10 MEQ PO TBCR: 10.0000 meq | EXTENDED_RELEASE_TABLET | Freq: Every day | ORAL | 3 refills | Status: DC

## 2018-02-28 MED ORDER — HYDROCHLOROTHIAZIDE 25 MG PO TABS: 25.0000 mg | ORAL_TABLET | Freq: Every day | ORAL | 3 refills | Status: DC

## 2018-02-28 NOTE — Progress Notes (Signed)
Carolyn Mcguire Date of Birth  1956-06-23 [facility] [address]    [place], [place]  [address] [phone]  Fax  [phone]  Problem list: 1. Left bundle branch block 2. Hypothyroidism 3. Hyperlipidemia 4. Hypertension 5. Dyspnea on exertion   History of Present Illness:  62 yo female with a hx of LBBB.  Stress myoview was negative.  She's been on a diet and exercise program and has lost about 6 pounds since last year.  She is drinking more water, avoiding fast foods.  She is walking regularly.  She has been diagnosed with migraines.  She thinks muscle tension may be contributing to her headaches.  Oct. 5, 2016:  Carolyn Mcguire is seen today for follow up of her shortness of breath Has had many deaths in her family and became depressed . Was started on Welbutrin for depression .  She was seen in the ER. Was told by the pharmacist at her primary medical doctors office thought it was due to the discontinuation of her Welbutrin. She is still not feeling 100% better so she was scheduled to see me .  She is still short of breath.    DOE  With walking up from the buildings in the back of her property.  She does not getting any regular exercise. She was bitten by a fox on July 11.  Had a series of rabies shots ( the fox was rabid)  Is retired .   She has had a stressful 2 years.    Several deaths in her family . Has not had the time or inclination  Wants to start a walking program.     No CP   February 21, 2016: Doing well Has lost some weight .   Has lost 9 lbs since I last saw her  Walks regularly   Sept. 7, 2017: Doing well BP is doing well at home. Still walking some  Has been to a nutritionist.   March 2 , 2108: Carolyn Mcguire is seen today .  Follow up for hypertension and hyper-hyperlipidemia, and hypothyroidism. BP is well controlled.   February 28, 2018: Carolyn Mcguire is seen today for follow-up of her hypertension and hyperlipidemia.  She has a left bundle branch  block. BP has been ok.   A bit hight today  Wt = 250  ( up 7 lbs from last year )  Has been eating out lots  .    Current Outpatient Medications on File Prior to Visit  Medication Sig Dispense Refill  . Cholecalciferol (VITAMIN D3) 1000 units CAPS Take by mouth. One daily    . hydrochlorothiazide (HYDRODIURIL) 25 MG tablet Take 12.5 mg by mouth daily.   4  . levothyroxine (SYNTHROID, LEVOTHROID) 88 MCG tablet Take 88 mcg by mouth daily before breakfast.    . losartan (COZAAR) 100 MG tablet Take 100 mg by mouth daily. as directed  3  . metoprolol succinate (TOPROL XL) 25 MG 24 hr tablet Take 1 tablet (25 mg total) by mouth daily. 90 tablet 3  . Multiple Vitamin (MULTIVITAMIN) tablet Take 1 tablet by mouth daily. GUMMIES    . rizatriptan (MAXALT-MLT) 10 MG disintegrating tablet Take 1 tablet (10 mg total) by mouth as needed for migraine. May repeat in 2 hours if needed 10 tablet 8  . rosuvastatin (CRESTOR) 10 MG tablet Take 10 mg by mouth daily.    Marland Kitchen Specialty Vitamins Products (JOINT/BONE VITALITY PO) Take 2 oz by mouth daily.  No current facility-administered medications on file prior to visit.     Allergies  Allergen Reactions  . Sulfur Rash    MAKES PT RED    Past Medical History:  Diagnosis Date  . Adenomatous colon polyp   . Allergic rhinitis   . Animal bite 05/2015   fox   . Anxiety   . Arthritis    shoulder  . Carpal tunnel syndrome    RIGHT HAND  . Depression   . Diastolic dysfunction    a. 2D echo 09/08/15: EF 55-60%, grade 1 DD, elevated LVEDP, mild MR, mild TR. - normal BNP 08/2015.  Marland Kitchen Essential hypertension   . GERD (gastroesophageal reflux disease)   . Headache   . Heart murmur   . Hepatic steatosis   . Hyperlipidemia   . Hypothyroidism   . LBBB (left bundle branch block)    a. negative nuclear stress test 06/2011.  . Migraines   . Obesity   . OSA (obstructive sleep apnea)    mild    Past Surgical History:  Procedure Laterality Date  . DERMOID  CYST  EXCISION     OF HER RIGHT OVARY. SIZE OF A GRAPEFRUIT. OVARY IS STILL THERE.  Marland Kitchen HEMITHYROIDECTOMY    . RUPTURE BELLY BUTTON     WHEN SHE WAS AN INFANT  . TONSILECTOMY, ADENOIDECTOMY, BILATERAL MYRINGOTOMY AND TUBES     AGE 65, pt states she didn't have tubes put into her ears    Social History   Tobacco Use  Smoking Status Never Smoker  Smokeless Tobacco Never Used    Social History   Substance and Sexual Activity  Alcohol Use No    Family History  Problem Relation Age of Onset  . Ovarian cancer Mother        dx. 69-59; TAH-BSO at 65  . Diabetes Mother   . Pancreatic cancer Mother 38       d. 18  . Skin cancer Mother        maybe basal cell carcinoma  . Heart disease Father   . Bladder Cancer Father        dx 82-81  . Skin cancer Father        possibly melanoma; +sun exposure  . Cancer Brother 79       salivary gland cancer; d. 63; no tobacco use  . Colon cancer Maternal Uncle        dx late 20s; d. early 27s  . Ovarian cancer Maternal Grandmother        d. 40; w/ mets  . Breast cancer Maternal Aunt        dx unspecified age; s/p mastectomy  . Other Maternal Aunt        intellectual disabilities  . Cancer Paternal Uncle        CLL, dx. 61  . Heart attack Maternal Grandfather        d. 26  . Heart attack Maternal Uncle 51  . Heart failure Maternal Aunt 86       acute diastolic heart failure  . Esophageal cancer Neg Hx   . Rectal cancer Neg Hx   . Stomach cancer Neg Hx     Reviw of Systems:  Noted in current history, all other systems are negative.Marland Kitchen  Physical Exam: Blood pressure (!) 154/82, pulse 69, height 5\' 4"  (1.626 m), weight 250 lb 6.4 oz (113.6 kg).  GEN:   Middle age female,  Morbidly obese  HEENT: Normal NECK: No JVD; No  carotid bruits LYMPHATICS: No lymphadenopathy CARDIAC: RRR  RESPIRATORY:  Clear to auscultation without rales, wheezing or rhonchi  ABDOMEN: Soft, non-tender, non-distended MUSCULOSKELETAL:  No edema; No deformity   SKIN: Warm and dry NEUROLOGIC:  Alert and oriented x 3     ECG: February 28, 2018: Normal sinus rhythm at 69.  Left bundle branch block.  No changes from previous EKG.  Assessment / Plan:   1. Left bundle branch block-  .   Stable.  2. Hypothyroidism-    managed by her primary medical doctor.  3. Hyperlipidemia - - managed by primary MD   4. Hypertension -   BP is elevated.   She eats out 3-4 times a week Eats too much breaad  Has gained weight each year over the past several hears.   Strongly advised weight loss.  I advised her to cook at home. We will increase her hydrochlorthiazide to 25 mg a day.  Add potassium chloride 10 mg a day.  She will return to see our practitioner in 3 months.  We will check a basic metabolic profile in 3 weeks.   5. Dyspnea on exertion-     Mertie Moores, MD  02/28/2018 11:55 AM    [facility] [place],  [place], [place]  [address] Pager [phone] Phone: [phone]; Fax: [phone]

## 2018-02-28 NOTE — Patient Instructions (Addendum)
Medication Instructions:  Your physician has recommended you make the following change in your medication:  INCREASE HCTZ (Hydrochlorothiazide) to 25 mg once daily START Kdur (potassium chloride) to 10 mEq once daily   Labwork: Your physician recommends that you return for lab work in: 3 weeks for basic metabolic panel   Testing/Procedures: None Ordered   Follow-Up: Your physician recommends that you schedule a follow-up appointment in: 3 months with Pecolia Ades, NP  You have been referred to Nutrition Services  DASH Eating Plan DASH stands for "Dietary Approaches to Stop Hypertension." The DASH eating plan is a healthy eating plan that has been shown to reduce high blood pressure (hypertension). It may also reduce your risk for type 2 diabetes, heart disease, and stroke. The DASH eating plan may also help with weight loss. What are tips for following this plan? General guidelines  Avoid eating more than 2,300 mg (milligrams) of salt (sodium) a day. If you have hypertension, you may need to reduce your sodium intake to 1,500 mg a day.  Limit alcohol intake to no more than 1 drink a day for nonpregnant women and 2 drinks a day for men. One drink equals 12 oz of beer, 5 oz of wine, or 1 oz of hard liquor.  Work with your health care provider to maintain a healthy body weight or to lose weight. Ask what an ideal weight is for you.  Get at least 30 minutes of exercise that causes your heart to beat faster (aerobic exercise) most days of the week. Activities may include walking, swimming, or biking.  Work with your health care provider or diet and nutrition specialist (dietitian) to adjust your eating plan to your individual calorie needs. Reading food labels  Check food labels for the amount of sodium per serving. Choose foods with less than 5 percent of the Daily Value of sodium. Generally, foods with less than 300 mg of sodium per serving fit into this eating plan.  To find whole  grains, look for the word "whole" as the first word in the ingredient list. Shopping  Buy products labeled as "low-sodium" or "no salt added."  Buy fresh foods. Avoid canned foods and premade or frozen meals. Cooking  Avoid adding salt when cooking. Use salt-free seasonings or herbs instead of table salt or sea salt. Check with your health care provider or pharmacist before using salt substitutes.  Do not fry foods. Cook foods using healthy methods such as baking, boiling, grilling, and broiling instead.  Cook with heart-healthy oils, such as olive, canola, soybean, or sunflower oil. Meal planning   Eat a balanced diet that includes: ? 5 or more servings of fruits and vegetables each day. At each meal, try to fill half of your plate with fruits and vegetables. ? Up to 6-8 servings of whole grains each day. ? Less than 6 oz of lean meat, poultry, or fish each day. A 3-oz serving of meat is about the same size as a deck of cards. One egg equals 1 oz. ? 2 servings of low-fat dairy each day. ? A serving of nuts, seeds, or beans 5 times each week. ? Heart-healthy fats. Healthy fats called Omega-3 fatty acids are found in foods such as flaxseeds and coldwater fish, like sardines, salmon, and mackerel.  Limit how much you eat of the following: ? Canned or prepackaged foods. ? Food that is high in trans fat, such as fried foods. ? Food that is high in saturated fat, such as fatty  meat. ? Sweets, desserts, sugary drinks, and other foods with added sugar. ? Full-fat dairy products.  Do not salt foods before eating.  Try to eat at least 2 vegetarian meals each week.  Eat more home-cooked food and less restaurant, buffet, and fast food.  When eating at a restaurant, ask that your food be prepared with less salt or no salt, if possible. What foods are recommended? The items listed may not be a complete list. Talk with your dietitian about what dietary choices are best for  you. Grains Whole-grain or whole-wheat bread. Whole-grain or whole-wheat pasta. Brown rice. [place]. Bulgur. Whole-grain and low-sodium cereals. Pita bread. Low-fat, low-sodium crackers. Whole-wheat flour tortillas. Vegetables Fresh or frozen vegetables (raw, steamed, roasted, or grilled). Low-sodium or reduced-sodium tomato and vegetable juice. Low-sodium or reduced-sodium tomato sauce and tomato paste. Low-sodium or reduced-sodium canned vegetables. Fruits All fresh, dried, or frozen fruit. Canned fruit in natural juice (without added sugar). Meat and other protein foods Skinless chicken or [place]. Ground chicken or [place]. Pork with fat trimmed off. Fish and seafood. Egg whites. Dried beans, peas, or lentils. Unsalted nuts, nut butters, and seeds. Unsalted canned beans. Lean cuts of beef with fat trimmed off. Low-sodium, lean deli meat. Dairy Low-fat (1%) or fat-free (skim) milk. Fat-free, low-fat, or reduced-fat cheeses. Nonfat, low-sodium ricotta or cottage cheese. Low-fat or nonfat yogurt. Low-fat, low-sodium cheese. Fats and oils Soft margarine without trans fats. Vegetable oil. Low-fat, reduced-fat, or light mayonnaise and salad dressings (reduced-sodium). Canola, safflower, olive, soybean, and sunflower oils. Avocado. Seasoning and other foods Herbs. Spices. Seasoning mixes without salt. Unsalted popcorn and pretzels. Fat-free sweets. What foods are not recommended? The items listed may not be a complete list. Talk with your dietitian about what dietary choices are best for you. Grains Baked goods made with fat, such as croissants, muffins, or some breads. Dry pasta or rice meal packs. Vegetables Creamed or fried vegetables. Vegetables in a cheese sauce. Regular canned vegetables (not low-sodium or reduced-sodium). Regular canned tomato sauce and paste (not low-sodium or reduced-sodium). Regular tomato and vegetable juice (not low-sodium or reduced-sodium). Angie Fava.  Olives. Fruits Canned fruit in a light or heavy syrup. Fried fruit. Fruit in cream or butter sauce. Meat and other protein foods Fatty cuts of meat. Ribs. Fried meat. Berniece Salines. Sausage. Bologna and other processed lunch meats. Salami. Fatback. Hotdogs. Bratwurst. Salted nuts and seeds. Canned beans with added salt. Canned or smoked fish. Whole eggs or egg yolks. Chicken or [place] with skin. Dairy Whole or 2% milk, cream, and half-and-half. Whole or full-fat cream cheese. Whole-fat or sweetened yogurt. Full-fat cheese. Nondairy creamers. Whipped toppings. Processed cheese and cheese spreads. Fats and oils Butter. Stick margarine. Lard. Shortening. Ghee. Bacon fat. Tropical oils, such as coconut, palm kernel, or palm oil. Seasoning and other foods Salted popcorn and pretzels. Onion salt, garlic salt, seasoned salt, table salt, and sea salt. Worcestershire sauce. Tartar sauce. Barbecue sauce. Teriyaki sauce. Soy sauce, including reduced-sodium. Steak sauce. Canned and packaged gravies. Fish sauce. Oyster sauce. Cocktail sauce. Horseradish that you find on the shelf. Ketchup. Mustard. Meat flavorings and tenderizers. Bouillon cubes. Hot sauce and Tabasco sauce. Premade or packaged marinades. Premade or packaged taco seasonings. Relishes. Regular salad dressings. Where to find more information:  National Heart, Lung, and [place]: https://wilson-eaton.com/  American Heart Association: www.heart.org Summary  The DASH eating plan is a healthy eating plan that has been shown to reduce high blood pressure (hypertension). It may also reduce your risk for type  2 diabetes, heart disease, and stroke.  With the DASH eating plan, you should limit salt (sodium) intake to 2,300 mg a day. If you have hypertension, you may need to reduce your sodium intake to 1,500 mg a day.  When on the DASH eating plan, aim to eat more fresh fruits and vegetables, whole grains, lean proteins, low-fat dairy, and heart-healthy  fats.  Work with your health care provider or diet and nutrition specialist (dietitian) to adjust your eating plan to your individual calorie needs. This information is not intended to replace advice given to you by your health care provider. Make sure you discuss any questions you have with your health care provider. Document Released: 11/01/2011 Document Revised: 11/05/2016 Document Reviewed: 11/05/2016 Elsevier Interactive Patient Education  [employer].     If you need a refill on your cardiac medications before your next appointment, please call your pharmacy.   Thank you for choosing [facility]! Christen Bame, RN [phone]

## 2018-03-03 ENCOUNTER — Telehealth: Payer: Self-pay | Admitting: Cardiovascular Disease

## 2018-03-03 NOTE — Telephone Encounter (Signed)
Patient calling  Pt c/o medication issue:  1. Name of Medication: Hydrochlorothiazide  2. How are you currently taking this medication (dosage and times per day)? 25 mg// 1 X daily   3. Are you having a reaction (difficulty breathing--STAT)? no  4. What is your medication issue? Patient states that the dosage was recently increased, patient states that the medication does not help her urinate. Patient would like to know if she should continue taking potassium pills.

## 2018-03-03 NOTE — Telephone Encounter (Signed)
Spoke with patient who called to ask if she needs to take the potassium supplement with the HCTZ, which I verified for her. She states she has started cooking at home and is monitoring her salt intake. I advised her to continue current medications and continue to monitor BP until follow-up appointment in 3 months. She is scheduled for repeat bmet in a few weeks. She thanked me for the call.

## 2018-03-05 ENCOUNTER — Telehealth: Payer: Self-pay | Admitting: Cardiovascular Disease

## 2018-03-05 MED ORDER — HYDROCHLOROTHIAZIDE 12.5 MG PO CAPS: 12.5000 mg | ORAL_CAPSULE | Freq: Every day | ORAL | 3 refills | Status: DC

## 2018-03-05 NOTE — Telephone Encounter (Signed)
New message    Pt c/o medication issue:  1. Name of Medication:  hydrochlorothiazide (HYDRODIURIL) 25 MG tablet Take 1 tablet (25 mg total) by mouth daily.        2. How are you currently taking this medication (dosage and times per day)?  1 tablet daily  3. Are you having a reaction (difficulty breathing--STAT)? yes  4. What is your medication issue? Since having this medication increased patient states she is really weak , having trouble walking and nauseated

## 2018-03-05 NOTE — Telephone Encounter (Signed)
Spoke with patient who states she is feeling weak since increasing HCTZ to 25 mg daily last week. She states BP readings this week have been 113/77 mmHg, 102/73 mmHg. She states she has been eating a low sodium diet, eating at home more often and drinking more water. I advised her to decrease HCTZ back to 12.5 mg daily and continue low sodium diet. She states she has an appointment with the nutritionist tomorrow. I advised her to call back if she notices that BP is increasing prior to her follow-up appointment in 3 months. She asks about continuing the Kdur since she was not taking it when she was taking the 12.5 mg of HCTZ previously. I advised her that since we don't have a recent bmet to take the [place] every other day until her lab appointment on 4/22. She verbalized understanding and agreement with plan and thanked me for the call.

## 2018-03-06 ENCOUNTER — Encounter: Payer: BLUE CROSS/BLUE SHIELD | Attending: Cardiovascular Disease

## 2018-03-06 DIAGNOSIS — I10 Essential (primary) hypertension: Secondary | ICD-10-CM

## 2018-03-06 DIAGNOSIS — E669 Obesity, unspecified: Secondary | ICD-10-CM

## 2018-03-06 DIAGNOSIS — Z713 Dietary counseling and surveillance: Secondary | ICD-10-CM | POA: Insufficient documentation

## 2018-03-06 NOTE — Patient Instructions (Addendum)
-   Start incorporating more exercise. Challenge self by increasing "fast" walking or time. Practice of some simple chair exercises maybe utilizing the exercise bands.  - Continue to avoid salt shaker, canned foods, and salting foods while cooking -  Avoid skipping meals, if meals longer than 4-5 hours possibly add a balanced snack.  - Eat a wide variety of foods, create a balanced plate. Do not be afraid of fruit.  - Keep drinking water as you decrease intake of Diet Pepsi

## 2018-03-06 NOTE — Progress Notes (Signed)
Medical Nutrition Therapy:  Appt start time: 1130  end time:  1230.  Assessment:  Primary concerns today: pt was referred for weight loss. Pt has been tracking calories (1200-1800 kcal/day) and sodium levels since Sunday. Pt is interested in losing weight.   Pt has been weary of consuming sodium, does not use salt shaker or salt foods while cooking. Does not eat canned foods, but consumes frozen or fresh. Pt reports she was eating a lot of candy and chocolate but has quit.  Trying not to eat at night (after 8:00 PM). Decreasing intake of Diet Pepsi.  Pt with high stress levels. Pt with multiple family deaths and lots of family members moved away. Responsibility of house and farm land. Pt lives alone and takes care of self.   Pt does not have Internet and has a flip phone so has to drive elsewhere to obtain calorie and sodium content of foods consumed without food labels.     Preferred Learning Style:  No preference indicated   Learning Readiness:   Change in progress  MEDICATIONS: multivitamin, vitamin D3, potassium chloride, Joint/Bone Vitality PO   DIETARY INTAKE:  Usual eating pattern includes 2 meals and 2 snacks per day. ("If I know I'm going out for a meal, I skip the meal before")   Everyday foods include chicken, hamburger or fish.  Avoided foods include liver, brussels sprouts.    24-hr recall:  B ( AM): scrambled egg, tomatoes and piece of toast, occasional [place] bacon. Or oatmeal with honey and cinnamon or flavored. Frosted Mini Wheats with 2% milk.  Snk ( AM): None reported  L ( PM): fish or hamburger patty, tossed salad with homemade egg salad, 1/2 baked potato, chicken with green beans or collards, 3 vanilla cookies Snk ( PM): air-popped popcorn with occasional melted butter D ( PM): chicken or hamburger, vegetable stir-fry mix with chicken (with [place] spray butter), 3 vanilla cookies Snk ( PM): maybe popcorn or peanut butter with low sodium crackers  Beverages:  water, ICE sparkling water, caffeine free diet Pepsi (decreasing intake)   Usual physical activity: walks on walking track at church (20-25 minutes for typically 5 days/week), yard work lives on a lot of land (plants in garden/pets cows)  Progress Towards Goal(s):  In progress.   Nutritional Diagnosis:  NB-1.1 Food and nutrition-related knowledge deficit As related to limited prior knowledge.  As evidenced by pt report.    Intervention:  Nutrition education provided. Discussed healthy weight loss and calorie goals. Discussed hunger and fullness cues. Elaborated on MyPlate method and promoted balanced meals and snacks. Encouraged pt to listen to her body and consume the foods that work for her when they work for her and tried to debunk some preconceived notions regarding nutrition. Encouraged pt to consume more than 1200 kcal/day.   Tips: - Start incorporating more exercise. Challenge self by increasing "fast" walking or time spent exercising. Practice some simple chair exercises maybe utilizing the exercise bands.  - Continue to avoid salt shaker, canned foods, and salting foods while cooking -  Avoid skipping meals, if meals longer than 4-5 hours possibly add a balanced snack.  - Eat a wide variety of foods, create a balanced plate. Do not be afraid of fruit.  - Keep drinking water!  Teaching Method Utilized:  Visual Auditory  Handouts given during visit include:  Exercises: Simple Chair Exercises That You Can Do  Snack Suggestions   Barriers to learning/adherence to lifestyle change: None reported  Demonstrated degree  of understanding via:  Teach Back   Monitoring/Evaluation:  Dietary intake, exercise, and body weight prn.

## 2018-03-06 NOTE — Telephone Encounter (Signed)
Agree with note by Army Melia, RN

## 2018-03-10 ENCOUNTER — Telehealth: Payer: Self-pay | Admitting: Cardiovascular Disease

## 2018-03-10 MED ORDER — HYDROCHLOROTHIAZIDE 12.5 MG PO CAPS: 12.5000 mg | ORAL_CAPSULE | Freq: Every day | ORAL | 3 refills | Status: DC

## 2018-03-10 NOTE — Telephone Encounter (Signed)
New Message   Pt c/o medication issue:  1. Name of Medication: ydrochlorothiazide (MICROZIDE) 12.5 MG capsule  2. How are you currently taking this medication (dosage and times per day)? 12.5 once a day  3. Are you having a reaction (difficulty breathing--STAT)?   4. What is your medication issue? Patient states her medication was decreased on the 10th but she is still feeling weak and nauseated. Please call to discuss,

## 2018-03-10 NOTE — Telephone Encounter (Signed)
Spoke with patient who reports that she continues to have nausea and dizziness with position changes. She states BP at home has been 120/68 mmHg and pulse 81 bpm. She states [place] [facility] [employer] [place] at 4:30 pm and BP was 130/66 mmHg. She states she feels better now. Reports she held HCTZ today. I advised her to continue to monitor and to take HCTZ tomorrow and every other day until lab appointment on 4/22 to see how she feels. She verbalized understanding and agreement with plan and will call back with worsening symptoms. She thanked me for the call.

## 2018-03-17 ENCOUNTER — Other Ambulatory Visit: Payer: BLUE CROSS/BLUE SHIELD | Admitting: *Deleted

## 2018-03-17 DIAGNOSIS — I447 Left bundle-branch block, unspecified: Secondary | ICD-10-CM | POA: Diagnosis not present

## 2018-03-17 DIAGNOSIS — I10 Essential (primary) hypertension: Secondary | ICD-10-CM

## 2018-03-17 IMAGING — NM NM HEPATO W/GB/PHARM/[PERSON_NAME]
1 series · 6 of 6 positions shown · non-contrast
Comparison: Gallbladder ultrasound 11/14/2015

CLINICAL DATA: epigastric pain, nausea

EXAM:
NUCLEAR MEDICINE HEPATOBILIARY IMAGING WITH GALLBLADDER EF
TECHNIQUE: Sequential images of the abdomen were obtained [DATE] minutes
following intravenous administration of radiopharmaceutical. After
oral ingestion of Ensure, gallbladder ejection fraction was
determined. At 60 min, normal ejection fraction is greater than 33%.
RADIOPHARMACEUTICALS:  5.1 mCi Pc-00m  Choletec IV

[Series 1: hepato · 4.46mm/px · 6 of 60 frames shown]
[frame 6/60]
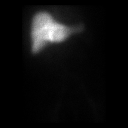
[frame 16/60]
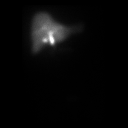
[frame 26/60]
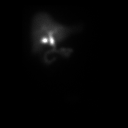
[frame 36/60]
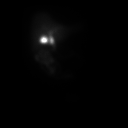
[frame 46/60]
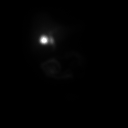
[frame 56/60]
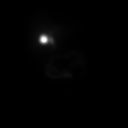

[6 of 6 positions shown; findings below may reference images not displayed]

FINDINGS: Prompt uptake and biliary excretion of activity by the liver is
seen. Gallbladder activity is visualized, consistent with patency of
cystic duct. Biliary activity passes into small bowel, consistent
with patent common bile duct.

Calculated gallbladder ejection fraction is 88.5%. (Normal
gallbladder ejection fraction with Ensure is greater than 33%.)
IMPRESSION: No cystic duct obstruction. No CBD obstruction. Post Ensure
gallbladder ejection fraction is 88.5%.

## 2018-03-18 ENCOUNTER — Telehealth: Payer: Self-pay | Admitting: Nurse Practitioner

## 2018-03-18 LAB — BASIC METABOLIC PANEL
BUN/Creatinine Ratio: 22 (ref 9–23)
BUN: 21 mg/dL (ref 6–24)
CO2: 25 mmol/L (ref 20–29)
Calcium: 9.5 mg/dL (ref 8.7–10.2)
Chloride: 102 mmol/L (ref 96–106)
Creatinine, Ser: 0.95 mg/dL (ref 0.57–1.00)
GFR calc Af Amer: 76 mL/min/{1.73_m2} (ref >59)
GFR, EST NON AFRICAN AMERICAN: 66 mL/min/{1.73_m2} (ref >59)
GLUCOSE: 118 mg/dL — AB (ref 65–99)
POTASSIUM: 4.4 mmol/L (ref 3.5–5.2)
SODIUM: 144 mmol/L (ref 134–144)

## 2018-03-18 MED ORDER — POTASSIUM CHLORIDE ER 10 MEQ PO TBCR: 10.0000 meq | EXTENDED_RELEASE_TABLET | ORAL | 3 refills | Status: DC

## 2018-03-18 MED ORDER — HYDROCHLOROTHIAZIDE 12.5 MG PO CAPS: 12.5000 mg | ORAL_CAPSULE | ORAL | 3 refills | Status: DC

## 2018-03-18 NOTE — Telephone Encounter (Signed)
Lab results reviewed with patient. She reports the following BP readings from this week: 118/70 mmHg,125/64, 124/72, 109/72; HR 70's bpm. She states she feels well and continues to follow a healthy diet with only 1 meal every 10 days from a restaurant. She states she is closely monitoring sodium and calories. I advised her to continue current treatment of HCTZ 12.5 mg and Kdur 10 mEq every other day and continue to monitor BP and HR. She will schedule a f/u with Pecolia Ades, NP in July. She thanked me for the call.

## 2018-04-01 DIAGNOSIS — I10 Essential (primary) hypertension: Secondary | ICD-10-CM | POA: Diagnosis not present

## 2018-04-01 DIAGNOSIS — F5104 Psychophysiologic insomnia: Secondary | ICD-10-CM | POA: Diagnosis not present

## 2018-04-02 ENCOUNTER — Other Ambulatory Visit: Payer: Self-pay | Admitting: Cardiovascular Disease

## 2018-04-25 DIAGNOSIS — L920 Granuloma annulare: Secondary | ICD-10-CM | POA: Diagnosis not present

## 2018-05-26 ENCOUNTER — Telehealth: Payer: Self-pay | Admitting: Internal Medicine

## 2018-05-26 ENCOUNTER — Other Ambulatory Visit: Payer: Self-pay

## 2018-05-26 MED ORDER — PRAMOXINE-HC 1-1 % EX CREA: TOPICAL_CREAM | Freq: Two times a day (BID) | CUTANEOUS | 1 refills | Status: DC

## 2018-05-26 NOTE — Telephone Encounter (Signed)
We need to understand whether this is fissure or hemorrhoid to know how to treat it. Previously prescribed gel would treat if this is a fissure otherwise can try Analpram twice daily for hemorrhoid If fails to improve she can be seen to ascertain what is going on

## 2018-05-26 NOTE — Telephone Encounter (Signed)
Pt is requesting prescription for hemorroids. 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 She wants to know if she could use the same gel in this case. Pls call her.

## 2018-05-26 NOTE — Telephone Encounter (Signed)
Pt has had a hemorrhoid for about 2 weeks, it has recently started bleeding. She states the burning and itching she was having has gotten better. Pt is requesting some cream be prescribed for this. Please advise.

## 2018-05-26 NOTE — Telephone Encounter (Signed)
Pt aware and feels it is a hemorrhoid. Script sent to pharmacy and pt aware.

## 2018-06-02 ENCOUNTER — Encounter: Payer: Self-pay | Admitting: Cardiology

## 2018-06-02 ENCOUNTER — Ambulatory Visit: Payer: BLUE CROSS/BLUE SHIELD | Admitting: Cardiology

## 2018-06-02 ENCOUNTER — Encounter (INDEPENDENT_AMBULATORY_CARE_PROVIDER_SITE_OTHER): Payer: Self-pay

## 2018-06-02 VITALS — BP 130/82 | HR 79 | Ht 64.5 in | Wt 233.2 lb

## 2018-06-02 DIAGNOSIS — E785 Hyperlipidemia, unspecified: Secondary | ICD-10-CM

## 2018-06-02 DIAGNOSIS — I10 Essential (primary) hypertension: Secondary | ICD-10-CM

## 2018-06-02 DIAGNOSIS — E669 Obesity, unspecified: Secondary | ICD-10-CM

## 2018-06-02 LAB — BASIC METABOLIC PANEL
BUN/Creatinine Ratio: 20 (ref 9–23)
BUN: 18 mg/dL (ref 6–24)
CALCIUM: 9.2 mg/dL (ref 8.7–10.2)
CHLORIDE: 102 mmol/L (ref 96–106)
CO2: 24 mmol/L (ref 20–29)
CREATININE: 0.92 mg/dL (ref 0.57–1.00)
GFR calc Af Amer: 79 mL/min/{1.73_m2} (ref >59)
GFR calc non Af Amer: 68 mL/min/{1.73_m2} (ref >59)
GLUCOSE: 109 mg/dL — AB (ref 65–99)
Potassium: 4 mmol/L (ref 3.5–5.2)
Sodium: 142 mmol/L (ref 134–144)

## 2018-06-02 NOTE — Patient Instructions (Addendum)
Medication Instructions: Your physician recommends that you continue on your current medications as directed. Please refer to the Current Medication list given to you today.   Labwork: TODAY: BMET  Procedures/Testing: None Ordered  Follow-Up: Your physician recommends that you schedule a follow-up appointment in:  6 months with Dr.Nasher     Any Additional Special Instructions Will Be Listed Below (If Applicable).   DASH Eating Plan DASH stands for "Dietary Approaches to Stop Hypertension." The DASH eating plan is a healthy eating plan that has been shown to reduce high blood pressure (hypertension). It may also reduce your risk for type 2 diabetes, heart disease, and stroke. The DASH eating plan may also help with weight loss. What are tips for following this plan? General guidelines  Avoid eating more than 2,300 mg (milligrams) of salt (sodium) a day. If you have hypertension, you may need to reduce your sodium intake to 1,500 mg a day.  Limit alcohol intake to no more than 1 drink a day for nonpregnant women and 2 drinks a day for men. One drink equals 12 oz of beer, 5 oz of wine, or 1 oz of hard liquor.  Work with your health care provider to maintain a healthy body weight or to lose weight. Ask what an ideal weight is for you.  Get at least 30 minutes of exercise that causes your heart to beat faster (aerobic exercise) most days of the week. Activities may include walking, swimming, or biking.  Work with your health care provider or diet and nutrition specialist (dietitian) to adjust your eating plan to your individual calorie needs. Reading food labels  Check food labels for the amount of sodium per serving. Choose foods with less than 5 percent of the Daily Value of sodium. Generally, foods with less than 300 mg of sodium per serving fit into this eating plan.  To find whole grains, look for the word "whole" as the first word in the ingredient list. Shopping  Buy  products labeled as "low-sodium" or "no salt added."  Buy fresh foods. Avoid canned foods and premade or frozen meals. Cooking  Avoid adding salt when cooking. Use salt-free seasonings or herbs instead of table salt or sea salt. Check with your health care provider or pharmacist before using salt substitutes.  Do not fry foods. Cook foods using healthy methods such as baking, boiling, grilling, and broiling instead.  Cook with heart-healthy oils, such as olive, canola, soybean, or sunflower oil. Meal planning   Eat a balanced diet that includes: ? 5 or more servings of fruits and vegetables each day. At each meal, try to fill half of your plate with fruits and vegetables. ? Up to 6-8 servings of whole grains each day. ? Less than 6 oz of lean meat, poultry, or fish each day. A 3-oz serving of meat is about the same size as a deck of cards. One egg equals 1 oz. ? 2 servings of low-fat dairy each day. ? A serving of nuts, seeds, or beans 5 times each week. ? Heart-healthy fats. Healthy fats called Omega-3 fatty acids are found in foods such as flaxseeds and coldwater fish, like sardines, salmon, and mackerel.  Limit how much you eat of the following: ? Canned or prepackaged foods. ? Food that is high in trans fat, such as fried foods. ? Food that is high in saturated fat, such as fatty meat. ? Sweets, desserts, sugary drinks, and other foods with added sugar. ? Full-fat dairy products.  Do  not salt foods before eating.  Try to eat at least 2 vegetarian meals each week.  Eat more home-cooked food and less restaurant, buffet, and fast food.  When eating at a restaurant, ask that your food be prepared with less salt or no salt, if possible. What foods are recommended? The items listed may not be a complete list. Talk with your dietitian about what dietary choices are best for you. Grains Whole-grain or whole-wheat bread. Whole-grain or whole-wheat pasta. Brown rice. [place].  Bulgur. Whole-grain and low-sodium cereals. Pita bread. Low-fat, low-sodium crackers. Whole-wheat flour tortillas. Vegetables Fresh or frozen vegetables (raw, steamed, roasted, or grilled). Low-sodium or reduced-sodium tomato and vegetable juice. Low-sodium or reduced-sodium tomato sauce and tomato paste. Low-sodium or reduced-sodium canned vegetables. Fruits All fresh, dried, or frozen fruit. Canned fruit in natural juice (without added sugar). Meat and other protein foods Skinless chicken or [place]. Ground chicken or [place]. Pork with fat trimmed off. Fish and seafood. Egg whites. Dried beans, peas, or lentils. Unsalted nuts, nut butters, and seeds. Unsalted canned beans. Lean cuts of beef with fat trimmed off. Low-sodium, lean deli meat. Dairy Low-fat (1%) or fat-free (skim) milk. Fat-free, low-fat, or reduced-fat cheeses. Nonfat, low-sodium ricotta or cottage cheese. Low-fat or nonfat yogurt. Low-fat, low-sodium cheese. Fats and oils Soft margarine without trans fats. Vegetable oil. Low-fat, reduced-fat, or light mayonnaise and salad dressings (reduced-sodium). Canola, safflower, olive, soybean, and sunflower oils. Avocado. Seasoning and other foods Herbs. Spices. Seasoning mixes without salt. Unsalted popcorn and pretzels. Fat-free sweets. What foods are not recommended? The items listed may not be a complete list. Talk with your dietitian about what dietary choices are best for you. Grains Baked goods made with fat, such as croissants, muffins, or some breads. Dry pasta or rice meal packs. Vegetables Creamed or fried vegetables. Vegetables in a cheese sauce. Regular canned vegetables (not low-sodium or reduced-sodium). Regular canned tomato sauce and paste (not low-sodium or reduced-sodium). Regular tomato and vegetable juice (not low-sodium or reduced-sodium). Angie Fava. Olives. Fruits Canned fruit in a light or heavy syrup. Fried fruit. Fruit in cream or butter sauce. Meat and other  protein foods Fatty cuts of meat. Ribs. Fried meat. Berniece Salines. Sausage. Bologna and other processed lunch meats. Salami. Fatback. Hotdogs. Bratwurst. Salted nuts and seeds. Canned beans with added salt. Canned or smoked fish. Whole eggs or egg yolks. Chicken or [place] with skin. Dairy Whole or 2% milk, cream, and half-and-half. Whole or full-fat cream cheese. Whole-fat or sweetened yogurt. Full-fat cheese. Nondairy creamers. Whipped toppings. Processed cheese and cheese spreads. Fats and oils Butter. Stick margarine. Lard. Shortening. Ghee. Bacon fat. Tropical oils, such as coconut, palm kernel, or palm oil. Seasoning and other foods Salted popcorn and pretzels. Onion salt, garlic salt, seasoned salt, table salt, and sea salt. Worcestershire sauce. Tartar sauce. Barbecue sauce. Teriyaki sauce. Soy sauce, including reduced-sodium. Steak sauce. Canned and packaged gravies. Fish sauce. Oyster sauce. Cocktail sauce. Horseradish that you find on the shelf. Ketchup. Mustard. Meat flavorings and tenderizers. Bouillon cubes. Hot sauce and Tabasco sauce. Premade or packaged marinades. Premade or packaged taco seasonings. Relishes. Regular salad dressings. Where to find more information:  National Heart, Lung, and [place]: https://wilson-eaton.com/  American Heart Association: www.heart.org Summary  The DASH eating plan is a healthy eating plan that has been shown to reduce high blood pressure (hypertension). It may also reduce your risk for type 2 diabetes, heart disease, and stroke.  With the DASH eating plan, you should limit salt (sodium) intake  to 2,300 mg a day. If you have hypertension, you may need to reduce your sodium intake to 1,500 mg a day.  When on the DASH eating plan, aim to eat more fresh fruits and vegetables, whole grains, lean proteins, low-fat dairy, and heart-healthy fats.  Work with your health care provider or diet and nutrition specialist (dietitian) to adjust your eating plan to your  individual calorie needs. This information is not intended to replace advice given to you by your health care provider. Make sure you discuss any questions you have with your health care provider. Document Released: 11/01/2011 Document Revised: 11/05/2016 Document Reviewed: 11/05/2016 Elsevier Interactive Patient Education  [employer].    If you need a refill on your cardiac medications before your next appointment, please call your pharmacy.

## 2018-06-02 NOTE — Progress Notes (Signed)
Cardiology Office Note:    Date:  06/02/2018   ID:  Carolyn Mcguire, DOB 01/08/1956, MRN [id number]  PCP:  [name], MD  Cardiologist:  Mertie Moores, MD  Referring MD: [name], MD   Chief Complaint  Patient presents with  . Follow-up    Hypertension    History of Present Illness:    Carolyn Mcguire is a 62 y.o. female with a past medical history significant for LBBB, hypothyroidism, hyperlipidemia, hypertension  Her blood pressure was elevated in April and her hydrochlorothiazide was increased to 25 mg daily with the addition of potassium 10 mEq daily.  She is here today for follow-up of her blood pressure. BP got too low after increase in HCTZ at 90/60 and she was nauseated with no energy. Her HCTZ was decreased to 12.5 mg QOD. BP normalized and she felt better. She has been trying to stay hydrated. She has been working on wt loss with exercise, walking 5 days per week for 20-25 minutes. She went to a nutritionist who advised 1800 cal/day. Her PCP advised 1500 cal/day, which she is trying to stick to. She does not have a smart phone and is monitoring calories manually. She has lost 17 lbs since 4/5. She attributes the lowering of blood pressure to her diet, wt loss and strictly limiting sodium intake. She is hopefull that she can one day come off of her BP meds if she loses enough wt.   Home BPs 120s/70's with occ 132/70's. She denies any chest discomfort, shortness of breath, orthopnea, PND, edema. palpitations, syncope.    Past Medical History:  Diagnosis Date  . Adenomatous colon polyp   . Allergic rhinitis   . Animal bite 05/2015   fox   . Anxiety   . Arthritis    shoulder  . Carpal tunnel syndrome    RIGHT HAND  . Depression   . Diastolic dysfunction    a. 2D echo 09/08/15: EF 55-60%, grade 1 DD, elevated LVEDP, mild MR, mild TR. - normal BNP 08/2015.  Marland Kitchen Essential hypertension   . GERD (gastroesophageal reflux disease)   . Headache   . Heart murmur   .  Hepatic steatosis   . Hyperlipidemia   . Hypothyroidism   . LBBB (left bundle branch block)    a. negative nuclear stress test 06/2011.  . Migraines   . Obesity   . OSA (obstructive sleep apnea)    mild    Past Surgical History:  Procedure Laterality Date  . DERMOID CYST  EXCISION     OF HER RIGHT OVARY. SIZE OF A GRAPEFRUIT. OVARY IS STILL THERE.  Marland Kitchen HEMITHYROIDECTOMY    . RUPTURE BELLY BUTTON     WHEN SHE WAS AN INFANT  . TONSILECTOMY, ADENOIDECTOMY, BILATERAL MYRINGOTOMY AND TUBES     AGE 38, pt states she didn't have tubes put into her ears    Current Medications: Current Meds  Medication Sig  . Cholecalciferol (VITAMIN D3) 1000 units CAPS Take by mouth. One daily  . hydrochlorothiazide (MICROZIDE) 12.5 MG capsule Take 1 capsule (12.5 mg total) by mouth every other day.  . levothyroxine (SYNTHROID, LEVOTHROID) 88 MCG tablet Take 88 mcg by mouth daily before breakfast.  . losartan (COZAAR) 100 MG tablet Take 100 mg by mouth daily. as directed  . metoprolol succinate (TOPROL-XL) 25 MG 24 hr tablet TAKE 1 TABLET BY MOUTH DAILY  . potassium chloride (K-DUR) 10 MEQ tablet Take 1 tablet (10 mEq total) by mouth every other  day.  . rizatriptan (MAXALT-MLT) 10 MG disintegrating tablet Take 1 tablet (10 mg total) by mouth as needed for migraine. May repeat in 2 hours if needed  . rosuvastatin (CRESTOR) 10 MG tablet Take 10 mg by mouth daily.     Allergies:   Sulfur   Social History   Socioeconomic History  . Marital status: Single    Spouse name: Not on file  . Number of children: 0  . Years of education: HS  . Highest education level: Not on file  Occupational History  . Occupation: Unemployed  Social Needs  . Financial resource strain: Not on file  . Food insecurity:    Worry: Not on file    Inability: Not on file  . Transportation needs:    Medical: Not on file    Non-medical: Not on file  Tobacco Use  . Smoking status: Never Smoker  . Smokeless tobacco: Never Used    Substance and Sexual Activity  . Alcohol use: No  . Drug use: No  . Sexual activity: Not on file  Lifestyle  . Physical activity:    Days per week: Not on file    Minutes per session: Not on file  . Stress: Not on file  Relationships  . Social connections:    Talks on phone: Not on file    Gets together: Not on file    Attends religious service: Not on file    Active member of club or organization: Not on file    Attends meetings of clubs or organizations: Not on file    Relationship status: Not on file  Other Topics Concern  . Not on file  Social History Narrative   Lives at home alone.   Right-handed.   She does not drink caffeine.  She will occasionally eat chocolate.     Family History: The patient's family history includes Bladder Cancer in her father; Breast cancer in her maternal aunt; Cancer in her paternal uncle; Cancer (age of onset: 38) in her brother; Colon cancer in her maternal uncle; Diabetes in her mother; Heart attack in her maternal grandfather; Heart attack (age of onset: 89) in her maternal uncle; Heart disease in her father; Heart failure (age of onset: 22) in her maternal aunt; Obesity in her mother; Other in her maternal aunt; Ovarian cancer in her maternal grandmother and mother; Pancreatic cancer (age of onset: 5) in her mother; Skin cancer in her father and mother. There is no history of Esophageal cancer, Rectal cancer, or Stomach cancer. ROS:   Please see the history of present illness.     All other systems reviewed and are negative.  EKGs/Labs/Other Studies Reviewed:    The following studies were reviewed today:  Echocardiogram 09/08/2015 Study Conclusions  - Left ventricle: The cavity size was normal. Systolic function was   normal. The estimated ejection fraction was in the range of 55%   to 60%. Wall motion was normal; there were no regional wall   motion abnormalities. Doppler parameters are consistent with   abnormal left ventricular  relaxation (grade 1 diastolic   dysfunction). Doppler parameters are consistent with elevated   ventricular end-diastolic filling pressure. - Aortic valve: There was no regurgitation. - Aortic root: The aortic root was normal in size. - Ascending aorta: The ascending aorta was normal in size. - Mitral valve: Structurally normal valve. There was mild   regurgitation. - Left atrium: The atrium was normal in size. - Right ventricle: Systolic function was normal. -  Tricuspid valve: There was mild regurgitation. - Pulmonary arteries: Systolic pressure was within the normal   range. - Inferior vena cava: The vessel was normal in size. The   respirophasic diameter changes were in the normal range (= 50%),   consistent with normal central venous pressure. - Pericardium, extracardiac: There was no pericardial effusion.  Adenosine Myoview 07/05/2011: Normal stress test.   EKG:  EKG is not ordered today.   Recent Labs: 03/17/2018: BUN 21; Creatinine, Ser 0.95; Potassium 4.4; Sodium 144   Recent Lipid Panel No results found for: CHOL, TRIG, HDL, CHOLHDL, VLDL, LDLCALC, LDLDIRECT  Physical Exam:    VS:  BP 130/82   Pulse 79   Ht 5' 4.5" (1.638 m)   Wt 233 lb 3.2 oz (105.8 kg)   SpO2 96%   BMI 39.41 kg/m     Wt Readings from Last 3 Encounters:  06/02/18 233 lb 3.2 oz (105.8 kg)  03/06/18 242 lb (109.8 kg)  02/28/18 250 lb 6.4 oz (113.6 kg)     Physical Exam  Constitutional: She is oriented to person, place, and time. She appears well-developed.  Obese female  HENT:  Head: Normocephalic and atraumatic.  Neck: Normal range of motion. Neck supple. No JVD present.  Cardiovascular: Normal rate, regular rhythm, normal heart sounds and intact distal pulses. Exam reveals no gallop and no friction rub.  No murmur heard. Pulmonary/Chest: Effort normal and breath sounds normal. No respiratory distress. She has no wheezes. She has no rales.  Abdominal: Soft. Bowel sounds are normal. She  exhibits no distension.  Musculoskeletal: Normal range of motion. She exhibits no edema or deformity.  Neurological: She is alert and oriented to person, place, and time.  Skin: Skin is warm and dry.  Psychiatric: She has a normal mood and affect. Her behavior is normal. Thought content normal.  Vitals reviewed.    ASSESSMENT:    1. Essential hypertension   2. Hyperlipidemia, unspecified hyperlipidemia type   3. Obesity (BMI 35.0-39.9 without comorbidity)    PLAN:    In order of problems listed above:  Hypertension: Hydrochlorothiazide was increased to 25 mg with addition of 10 mEq of potassium per day at last visit in April.  BP got too low at 90/60 and she was nauseated with no energy. Her HCTZ was decreased to 12.5 mg QOD. BP normalized and she felt better. Goal BP <130/80. Currently well controlled. Will continue current medications. Can increase HCTZ to 12.5 mg daily if her BP is running consistently over 130/80. Pt understands to call us if it is running high. Continue diet, exercise and wt loss. Will check BMet with her decrease in HCTZ and on potassium supplementation.  Hyperlipidemia: Management per primary care provider, on rosuvastatin 10 mg daily  Obesity: Body mass index is 39.41 kg/m. Went to a nutritionist who advised 1800 cal/day. The PCP advised 1500 cal/day. She has lost 17 lbs since 4/5. She is walking 5 days per week inside at her church, 20-25 minutes. She is now able to do yard work and bend over easier and not as short of breath with activity. She is congratulated on her progress and encouraged to keep up the good work. Information of DASH diet given and we discussed plan based diet and the difference between healthy fats and saturated & tans fats.   Medication Adjustments/Labs and Tests Ordered: Current medicines are reviewed at length with the patient today.  Concerns regarding medicines are outlined above. Labs and tests ordered and medication changes  are outlined  in the patient instructions below:  Patient Instructions  Medication Instructions: Your physician recommends that you continue on your current medications as directed. Please refer to the Current Medication list given to you today.   Labwork: TODAY: BMET  Procedures/Testing: None Ordered  Follow-Up: Your physician recommends that you schedule a follow-up appointment in:  6 months with Dr.Nasher     Any Additional Special Instructions Will Be Listed Below (If Applicable).   DASH Eating Plan DASH stands for "Dietary Approaches to Stop Hypertension." The DASH eating plan is a healthy eating plan that has been shown to reduce high blood pressure (hypertension). It may also reduce your risk for type 2 diabetes, heart disease, and stroke. The DASH eating plan may also help with weight loss. What are tips for following this plan? General guidelines  Avoid eating more than 2,300 mg (milligrams) of salt (sodium) a day. If you have hypertension, you may need to reduce your sodium intake to 1,500 mg a day.  Limit alcohol intake to no more than 1 drink a day for nonpregnant women and 2 drinks a day for men. One drink equals 12 oz of beer, 5 oz of wine, or 1 oz of hard liquor.  Work with your health care provider to maintain a healthy body weight or to lose weight. Ask what an ideal weight is for you.  Get at least 30 minutes of exercise that causes your heart to beat faster (aerobic exercise) most days of the week. Activities may include walking, swimming, or biking.  Work with your health care provider or diet and nutrition specialist (dietitian) to adjust your eating plan to your individual calorie needs. Reading food labels  Check food labels for the amount of sodium per serving. Choose foods with less than 5 percent of the Daily Value of sodium. Generally, foods with less than 300 mg of sodium per serving fit into this eating plan.  To find whole grains, look for the word "whole" as  the first word in the ingredient list. Shopping  Buy products labeled as "low-sodium" or "no salt added."  Buy fresh foods. Avoid canned foods and premade or frozen meals. Cooking  Avoid adding salt when cooking. Use salt-free seasonings or herbs instead of table salt or sea salt. Check with your health care provider or pharmacist before using salt substitutes.  Do not fry foods. Cook foods using healthy methods such as baking, boiling, grilling, and broiling instead.  Cook with heart-healthy oils, such as olive, canola, soybean, or sunflower oil. Meal planning   Eat a balanced diet that includes: ? 5 or more servings of fruits and vegetables each day. At each meal, try to fill half of your plate with fruits and vegetables. ? Up to 6-8 servings of whole grains each day. ? Less than 6 oz of lean meat, poultry, or fish each day. A 3-oz serving of meat is about the same size as a deck of cards. One egg equals 1 oz. ? 2 servings of low-fat dairy each day. ? A serving of nuts, seeds, or beans 5 times each week. ? Heart-healthy fats. Healthy fats called Omega-3 fatty acids are found in foods such as flaxseeds and coldwater fish, like sardines, salmon, and mackerel.  Limit how much you eat of the following: ? Canned or prepackaged foods. ? Food that is high in trans fat, such as fried foods. ? Food that is high in saturated fat, such as fatty meat. ? Sweets, desserts, sugary drinks,  and other foods with added sugar. ? Full-fat dairy products.  Do not salt foods before eating.  Try to eat at least 2 vegetarian meals each week.  Eat more home-cooked food and less restaurant, buffet, and fast food.  When eating at a restaurant, ask that your food be prepared with less salt or no salt, if possible. What foods are recommended? The items listed may not be a complete list. Talk with your dietitian about what dietary choices are best for you. Grains Whole-grain or whole-wheat bread.  Whole-grain or whole-wheat pasta. Brown rice. [place]. Bulgur. Whole-grain and low-sodium cereals. Pita bread. Low-fat, low-sodium crackers. Whole-wheat flour tortillas. Vegetables Fresh or frozen vegetables (raw, steamed, roasted, or grilled). Low-sodium or reduced-sodium tomato and vegetable juice. Low-sodium or reduced-sodium tomato sauce and tomato paste. Low-sodium or reduced-sodium canned vegetables. Fruits All fresh, dried, or frozen fruit. Canned fruit in natural juice (without added sugar). Meat and other protein foods Skinless chicken or [place]. Ground chicken or [place]. Pork with fat trimmed off. Fish and seafood. Egg whites. Dried beans, peas, or lentils. Unsalted nuts, nut butters, and seeds. Unsalted canned beans. Lean cuts of beef with fat trimmed off. Low-sodium, lean deli meat. Dairy Low-fat (1%) or fat-free (skim) milk. Fat-free, low-fat, or reduced-fat cheeses. Nonfat, low-sodium ricotta or cottage cheese. Low-fat or nonfat yogurt. Low-fat, low-sodium cheese. Fats and oils Soft margarine without trans fats. Vegetable oil. Low-fat, reduced-fat, or light mayonnaise and salad dressings (reduced-sodium). Canola, safflower, olive, soybean, and sunflower oils. Avocado. Seasoning and other foods Herbs. Spices. Seasoning mixes without salt. Unsalted popcorn and pretzels. Fat-free sweets. What foods are not recommended? The items listed may not be a complete list. Talk with your dietitian about what dietary choices are best for you. Grains Baked goods made with fat, such as croissants, muffins, or some breads. Dry pasta or rice meal packs. Vegetables Creamed or fried vegetables. Vegetables in a cheese sauce. Regular canned vegetables (not low-sodium or reduced-sodium). Regular canned tomato sauce and paste (not low-sodium or reduced-sodium). Regular tomato and vegetable juice (not low-sodium or reduced-sodium). Angie Fava. Olives. Fruits Canned fruit in a light or heavy syrup.  Fried fruit. Fruit in cream or butter sauce. Meat and other protein foods Fatty cuts of meat. Ribs. Fried meat. Berniece Salines. Sausage. Bologna and other processed lunch meats. Salami. Fatback. Hotdogs. Bratwurst. Salted nuts and seeds. Canned beans with added salt. Canned or smoked fish. Whole eggs or egg yolks. Chicken or [place] with skin. Dairy Whole or 2% milk, cream, and half-and-half. Whole or full-fat cream cheese. Whole-fat or sweetened yogurt. Full-fat cheese. Nondairy creamers. Whipped toppings. Processed cheese and cheese spreads. Fats and oils Butter. Stick margarine. Lard. Shortening. Ghee. Bacon fat. Tropical oils, such as coconut, palm kernel, or palm oil. Seasoning and other foods Salted popcorn and pretzels. Onion salt, garlic salt, seasoned salt, table salt, and sea salt. Worcestershire sauce. Tartar sauce. Barbecue sauce. Teriyaki sauce. Soy sauce, including reduced-sodium. Steak sauce. Canned and packaged gravies. Fish sauce. Oyster sauce. Cocktail sauce. Horseradish that you find on the shelf. Ketchup. Mustard. Meat flavorings and tenderizers. Bouillon cubes. Hot sauce and Tabasco sauce. Premade or packaged marinades. Premade or packaged taco seasonings. Relishes. Regular salad dressings. Where to find more information:  National Heart, Lung, and [place]: https://wilson-eaton.com/  American Heart Association: www.heart.org Summary  The DASH eating plan is a healthy eating plan that has been shown to reduce high blood pressure (hypertension). It may also reduce your risk for type 2 diabetes, heart disease, and stroke.  With the DASH eating plan, you should limit salt (sodium) intake to 2,300 mg a day. If you have hypertension, you may need to reduce your sodium intake to 1,500 mg a day.  When on the DASH eating plan, aim to eat more fresh fruits and vegetables, whole grains, lean proteins, low-fat dairy, and heart-healthy fats.  Work with your health care provider or diet and  nutrition specialist (dietitian) to adjust your eating plan to your individual calorie needs. This information is not intended to replace advice given to you by your health care provider. Make sure you discuss any questions you have with your health care provider. Document Released: 11/01/2011 Document Revised: 11/05/2016 Document Reviewed: 11/05/2016 Elsevier Interactive Patient Education  [employer].    If you need a refill on your cardiac medications before your next appointment, please call your pharmacy.      Signed, Daune Perch, NP  06/02/2018 11:28 AM    [facility]

## 2018-06-14 ENCOUNTER — Ambulatory Visit (HOSPITAL_COMMUNITY)
Admission: EM | Admit: 2018-06-14 | Discharge: 2018-06-14 | Disposition: A | Discharge status: Home / Self Care | Payer: BLUE CROSS/BLUE SHIELD | Attending: Family Medicine | Admitting: Family Medicine

## 2018-06-14 ENCOUNTER — Other Ambulatory Visit: Payer: Self-pay

## 2018-06-14 ENCOUNTER — Encounter (HOSPITAL_COMMUNITY): Payer: Self-pay

## 2018-06-14 DIAGNOSIS — M79604 Pain in right leg: Secondary | ICD-10-CM

## 2018-06-14 DIAGNOSIS — M25551 Pain in right hip: Secondary | ICD-10-CM

## 2018-06-14 DIAGNOSIS — M25561 Pain in right knee: Secondary | ICD-10-CM | POA: Diagnosis not present

## 2018-06-14 MED ORDER — IBUPROFEN 400 MG PO TABS: 400.0000 mg | ORAL_TABLET | Freq: Three times a day (TID) | ORAL | 0 refills | Status: AC | PRN

## 2018-06-14 MED ORDER — BACLOFEN 5 MG PO TABS: 5.0000 mg | ORAL_TABLET | Freq: Three times a day (TID) | ORAL | 0 refills | Status: DC | PRN

## 2018-06-14 NOTE — ED Triage Notes (Signed)
Pt presents to [facility] for rt leg pain since this morning, pt complains of rt hip pain as well. Pt has taken Advil to treat pain but has no relief

## 2018-06-14 NOTE — Discharge Instructions (Signed)
It was nice seeing you today. I am sorry about your right leg pain. Sounds like muscle pain. Let us try pain meds and muscle relaxant for a while. If no improvement, or it worsens, please go to the ED.

## 2018-06-14 NOTE — ED Provider Notes (Signed)
Ferdinand    CSN: [id number] Arrival date & time: 06/14/18  1627     History   Chief Complaint Chief Complaint  Patient presents with  . Leg Pain    HPI Carolyn Mcguire is a 62 y.o. female.   The history is provided by the patient. No language interpreter was used.  Leg Pain  Location:  Hip, leg and knee Time since incident:  6 hours Injury: no   Hip location:  R hip Leg location:  R leg Knee location:  R knee Pain details:    Quality:  Unable to specify   Radiates to: Radiates from the hip to knee to the top part of her right foot.   Pain severity now: 10/10 in severity.   Onset quality:  Gradual   Duration:  6 hours   Timing:  Constant   Progression:  Unchanged Chronicity:  New Prior injury to area:  No Relieved by:  Nothing Exacerbated by: Sitting down certain way. It started after she sat on a stool for a long time. Associated symptoms: tingling   Associated symptoms: no back pain, no decreased ROM, no muscle weakness and no numbness   Hypertension  This is a chronic problem. The problem has not changed since onset.Exacerbated by: Pain. The symptoms are relieved by medications.  She is compliant with her meds.  Past Medical History:  Diagnosis Date  . Adenomatous colon polyp   . Allergic rhinitis   . Animal bite 05/2015   fox   . Anxiety   . Arthritis    shoulder  . Carpal tunnel syndrome    RIGHT HAND  . Depression   . Diastolic dysfunction    a. 2D echo 09/08/15: EF 55-60%, grade 1 DD, elevated LVEDP, mild MR, mild TR. - normal BNP 08/2015.  Marland Kitchen Essential hypertension   . GERD (gastroesophageal reflux disease)   . Headache   . Heart murmur   . Hepatic steatosis   . Hyperlipidemia   . Hypothyroidism   . LBBB (left bundle branch block)    a. negative nuclear stress test 06/2011.  . Migraines   . Obesity   . OSA (obstructive sleep apnea)    mild    Patient Active Problem List   Diagnosis Date Noted  . Obstructive sleep apnea  09/09/2017  . Somnolence, daytime 06/07/2017  . Persistent headaches 02/27/2017  . Genetic testing 09/03/2016  . Family history of ovarian cancer 08/14/2016  . Porokeratosis 07/05/2016  . Diastolic dysfunction 70/26/3785  . Obesity   . Essential hypertension   . Hyperlipidemia   . HTN (hypertension) 04/23/2012  . LBBB (left bundle branch block) 06/15/2011    Past Surgical History:  Procedure Laterality Date  . DERMOID CYST  EXCISION     OF HER RIGHT OVARY. SIZE OF A GRAPEFRUIT. OVARY IS STILL THERE.  Marland Kitchen HEMITHYROIDECTOMY    . RUPTURE BELLY BUTTON     WHEN SHE WAS AN INFANT  . TONSILECTOMY, ADENOIDECTOMY, BILATERAL MYRINGOTOMY AND TUBES     AGE 42, pt states she didn't have tubes put into her ears    OB History   None      Home Medications    Prior to Admission medications   Medication Sig Start Date End Date Taking? Authorizing Provider  Cholecalciferol (VITAMIN D3) 1000 units CAPS Take by mouth. One daily   Yes [provider]  hydrochlorothiazide (MICROZIDE) 12.5 MG capsule Take 1 capsule (12.5 mg total) by mouth every other day.  03/18/18  Yes Nahser, Wonda Cheng, MD  levothyroxine (SYNTHROID, LEVOTHROID) 88 MCG tablet Take 88 mcg by mouth daily before breakfast.   Yes [provider]  losartan (COZAAR) 100 MG tablet Take 100 mg by mouth daily. as directed 06/12/17  Yes [provider]  metoprolol succinate (TOPROL-XL) 25 MG 24 hr tablet TAKE 1 TABLET BY MOUTH DAILY 04/02/18  Yes Nahser, Wonda Cheng, MD  potassium chloride (K-DUR) 10 MEQ tablet Take 1 tablet (10 mEq total) by mouth every other day. 03/18/18 06/16/18 Yes Nahser, Wonda Cheng, MD  rosuvastatin (CRESTOR) 10 MG tablet Take 10 mg by mouth daily.   Yes [provider]  rizatriptan (MAXALT-MLT) 10 MG disintegrating tablet Take 1 tablet (10 mg total) by mouth as needed for migraine. May repeat in 2 hours if needed 02/12/18   Dennie Bible, NP    Family History Family History  Problem  Relation Age of Onset  . Ovarian cancer Mother        dx. 25-59; TAH-BSO at 49  . Diabetes Mother   . Pancreatic cancer Mother 22       d. 81  . Skin cancer Mother        maybe basal cell carcinoma  . Obesity Mother   . Heart disease Father   . Bladder Cancer Father        dx 6-81  . Skin cancer Father        possibly melanoma; +sun exposure  . Cancer Brother 94       salivary gland cancer; d. 71; no tobacco use  . Colon cancer Maternal Uncle        dx late 98s; d. early 81s  . Ovarian cancer Maternal Grandmother        d. 62; w/ mets  . Breast cancer Maternal Aunt        dx unspecified age; s/p mastectomy  . Other Maternal Aunt        intellectual disabilities  . Cancer Paternal Uncle        CLL, dx. 7  . Heart attack Maternal Grandfather        d. 58  . Heart attack Maternal Uncle 51  . Heart failure Maternal Aunt 86       acute diastolic heart failure  . Esophageal cancer Neg Hx   . Rectal cancer Neg Hx   . Stomach cancer Neg Hx     Social History Social History   Tobacco Use  . Smoking status: Never Smoker  . Smokeless tobacco: Never Used  Substance Use Topics  . Alcohol use: No  . Drug use: No     Allergies   Sulfur   Review of Systems Review of Systems  Respiratory: Negative.   Cardiovascular: Negative.   Gastrointestinal: Negative.   Genitourinary: Negative.   Musculoskeletal: Positive for arthralgias. Negative for back pain, joint swelling and myalgias.  Neurological: Negative.   All other systems reviewed and are negative.    Physical Exam Triage Vital Signs ED Triage Vitals  Enc Vitals Group     BP 06/14/18 1730 (!) 151/61     Pulse Rate 06/14/18 1730 64     Resp 06/14/18 1730 16     Temp 06/14/18 1730 98.3 F (36.8 C)     Temp Source 06/14/18 1730 Oral     SpO2 06/14/18 1730 99 %     Weight --      Height --      Head Circumference --  Peak Flow --      Pain Score 06/14/18 1739 10     Pain Loc --      Pain Edu? --       Excl. in [place]? --    No data found.  Updated Vital Signs BP (!) 151/61 (BP Location: Right Arm) Comment: notified christina  Pulse 64   Temp 98.3 F (36.8 C) (Oral)   Resp 16   SpO2 99%   Visual Acuity Right Eye Distance:   Left Eye Distance:   Bilateral Distance:    Right Eye Near:   Left Eye Near:    Bilateral Near:     Physical Exam  Constitutional: She is oriented to person, place, and time. She appears well-developed. No distress.  Cardiovascular: Normal rate, regular rhythm and normal heart sounds. Exam reveals no friction rub.  No murmur heard. Pulmonary/Chest: Effort normal and breath sounds normal. No stridor. No respiratory distress. She has no wheezes.  Musculoskeletal: Normal range of motion. She exhibits no edema, tenderness or deformity.       Right hip: Normal.       Left hip: Normal.       Right knee: Normal.       Left knee: Normal.       Right ankle: Normal.       Left ankle: Normal.  No swelling or erythema of her right LL. No tenderness with palpation of her right LL from hip downwards to her ankle.  No calf swelling, erythema or tenderness. MSK exam benign.  Neurological: She is alert and oriented to person, place, and time. No cranial nerve deficit or sensory deficit. Coordination normal.  Nursing note and vitals reviewed.    UC Treatments / Results  Labs (all labs ordered are listed, but only abnormal results are displayed) Labs Reviewed - No data to display  EKG None  Radiology No results found.  Procedures Procedures (including critical care time)  Medications Ordered in UC Medications - No data to display  Initial Impression / Assessment and Plan / UC Course  I have reviewed the triage vital signs and the nursing notes.  Pertinent labs & imaging results that were available during my care of the patient were reviewed by me and considered in my medical decision making (see chart for details).  Clinical Course as of Jun 14 1812    Sat Jun 14, 2018  1810 Right hip, knee and lower limp pain. Physical exam benign. No neurologic deficit. Trial of muscle relaxant and Ibuprofen recommended. ED precaution given. F/U with [place] as needed. She agreed with the plan.   [KE]  1812 BP likely elevated due to pain. She stated that she is compliant with her BP meds and her numbers had been fine at home. Continue current regimen. See PCP soon,.   [KE]    Clinical Course User Index [KE] Kinnie Feil, MD    Pain of right hip joint  Acute pain of right knee  Right leg pain   Final Clinical Impressions(s) / UC Diagnoses   Final diagnoses:  None   Discharge Instructions   None    ED Prescriptions    None     Controlled Substance Prescriptions [place] Controlled Substance Registry consulted? Not Applicable   Kinnie Feil, MD 06/14/18 1815

## 2018-06-16 DIAGNOSIS — I10 Essential (primary) hypertension: Secondary | ICD-10-CM | POA: Diagnosis not present

## 2018-06-24 DIAGNOSIS — M545 Low back pain: Secondary | ICD-10-CM | POA: Diagnosis not present

## 2018-06-25 DIAGNOSIS — M5441 Lumbago with sciatica, right side: Secondary | ICD-10-CM | POA: Diagnosis not present

## 2018-06-26 ENCOUNTER — Telehealth: Payer: Self-pay | Admitting: Physician Assistant

## 2018-06-26 ENCOUNTER — Ambulatory Visit: Payer: BLUE CROSS/BLUE SHIELD | Admitting: Physician Assistant

## 2018-06-26 ENCOUNTER — Encounter: Payer: Self-pay | Admitting: Physician Assistant

## 2018-06-26 VITALS — BP 138/80 | HR 64 | Ht 63.5 in | Wt 230.2 lb

## 2018-06-26 DIAGNOSIS — Z86010 Personal history of colonic polyps: Secondary | ICD-10-CM | POA: Diagnosis not present

## 2018-06-26 DIAGNOSIS — K648 Other hemorrhoids: Secondary | ICD-10-CM | POA: Diagnosis not present

## 2018-06-26 DIAGNOSIS — K644 Residual hemorrhoidal skin tags: Secondary | ICD-10-CM

## 2018-06-26 MED ORDER — HYDROCORTISONE ACETATE 25 MG RE SUPP: 25.0000 mg | Freq: Every evening | RECTAL | 3 refills | Status: DC | PRN

## 2018-06-26 NOTE — Telephone Encounter (Signed)
Patient states medication  sent in today after appointment is too expensive and would like something else cheaper if possible.

## 2018-06-26 NOTE — Patient Instructions (Signed)
If you are age 60 or older, your body mass index should be between 23-30. Your Body mass index is 40.15 kg/m. If this is out of the aforementioned range listed, please consider follow up with your Primary Care Provider.  If you are age 36 or younger, your body mass index should be between 19-25. Your Body mass index is 40.15 kg/m. If this is out of the aformentioned range listed, please consider follow up with your Primary Care Provider.   We have sent the following medications to your pharmacy for you to pick up at your convenience: Anusol [facility] suppositories  Please follow up with Amy Esterwood or Dr. Hilarie Fredrickson as needed.  You have a follow up Colonoscopy in for 2023, you will be contacted around that time to schedule.

## 2018-06-26 NOTE — Progress Notes (Addendum)
Subjective:    Patient ID: Carolyn Mcguire, female    DOB: 08/22/1956, 62 y.o.   MRN: [id number]  HPI Carolyn Mcguire is a pleasant 62 year old white female known to Dr. Hilarie Fredrickson who comes in today with concerns about hemorrhoids.    She has history of left bundle branch block, hypertension and sleep apnea. She had colonoscopy in July 2018 with finding of 2 sessile polyps in the descending colon which were 4 to 7 mm in size and otherwise negative exam.  There were no internal hemorrhoids noted at that time.  Path on the polyps consistent with sessile serrated polyps and she is indicated for 5-year interval follow-up. She was  seen in March 2018 with rectal bleeding and at that time did have a shallow posterior fissure. Current symptoms started about 3 to 4 weeks ago.  Describes feeling a small knot at the anus.  She says she has not had any significant pain.  No current problems with her bowels no excessive straining or constipation.  She mentions that she has lost about 20 pounds since April intentionally by watching portion size.  She has no complaints of abdominal cramping or pain. She has not had any bleeding, and has been more concerned about discomfort.  She had been using Analpram twice daily which did help.  Symptoms have improved to though she continues to have some mild irritation and itching. Review of Systems Pertinent positive and negative review of systems were noted in the above HPI section.  All other review of systems was otherwise negative.  Outpatient Encounter Medications as of 06/26/2018  Medication Sig  . Cholecalciferol (VITAMIN D3) 1000 units CAPS Take by mouth. One daily  . hydrochlorothiazide (MICROZIDE) 12.5 MG capsule Take 1 capsule (12.5 mg total) by mouth every other day.  . levothyroxine (SYNTHROID, LEVOTHROID) 88 MCG tablet Take 88 mcg by mouth daily before breakfast.  . losartan (COZAAR) 100 MG tablet Take 100 mg by mouth daily. as directed  . meloxicam (MOBIC) 15 MG tablet  Take 15 mg by mouth daily as needed for pain.  . metoprolol succinate (TOPROL-XL) 25 MG 24 hr tablet TAKE 1 TABLET BY MOUTH DAILY  . potassium chloride (K-DUR) 10 MEQ tablet Take 1 tablet (10 mEq total) by mouth every other day.  . rizatriptan (MAXALT-MLT) 10 MG disintegrating tablet Take 1 tablet (10 mg total) by mouth as needed for migraine. May repeat in 2 hours if needed  . rosuvastatin (CRESTOR) 10 MG tablet Take 10 mg by mouth daily.  . hydrocortisone (ANUSOL-HC) 25 MG suppository Place 1 suppository (25 mg total) rectally at bedtime as needed for hemorrhoids or anal itching.  . [DISCONTINUED] baclofen 5 MG TABS Take 5 mg by mouth 3 (three) times daily as needed for muscle spasms. Don't use and drive   No facility-administered encounter medications on file as of 06/26/2018.    Allergies  Allergen Reactions  . Sulfur Rash    MAKES PT RED   Patient Active Problem List   Diagnosis Date Noted  . Obstructive sleep apnea 09/09/2017  . Somnolence, daytime 06/07/2017  . Persistent headaches 02/27/2017  . Genetic testing 09/03/2016  . Family history of ovarian cancer 08/14/2016  . Porokeratosis 07/05/2016  . Diastolic dysfunction 79/89/2119  . Obesity   . Essential hypertension   . Hyperlipidemia   . HTN (hypertension) 04/23/2012  . LBBB (left bundle branch block) 06/15/2011   Social History   Socioeconomic History  . Marital status: Single    Spouse name:  Not on file  . Number of children: 0  . Years of education: HS  . Highest education level: Not on file  Occupational History  . Occupation: Unemployed  Social Needs  . Financial resource strain: Not on file  . Food insecurity:    Worry: Not on file    Inability: Not on file  . Transportation needs:    Medical: Not on file    Non-medical: Not on file  Tobacco Use  . Smoking status: Never Smoker  . Smokeless tobacco: Never Used  Substance and Sexual Activity  . Alcohol use: No  . Drug use: No  . Sexual activity: Not  Currently  Lifestyle  . Physical activity:    Days per week: Not on file    Minutes per session: Not on file  . Stress: Not on file  Relationships  . Social connections:    Talks on phone: Not on file    Gets together: Not on file    Attends religious service: Not on file    Active member of club or organization: Not on file    Attends meetings of clubs or organizations: Not on file    Relationship status: Not on file  . Intimate partner violence:    Fear of current or ex partner: Not on file    Emotionally abused: Not on file    Physically abused: Not on file    Forced sexual activity: Not on file  Other Topics Concern  . Not on file  Social History Narrative   Lives at home alone.   Right-handed.   She does not drink caffeine.  She will occasionally eat chocolate.    Ms. Carolyn Mcguire family history includes Bladder Cancer in her father; Breast cancer in her maternal aunt; Cancer in her paternal uncle; Cancer (age of onset: 22) in her brother; Colon cancer in her maternal uncle; Diabetes in her mother; Heart attack in her maternal grandfather; Heart attack (age of onset: 44) in her maternal uncle; Heart disease in her father; Heart failure (age of onset: 70) in her maternal aunt; Obesity in her mother; Other in her maternal aunt; Ovarian cancer in her maternal grandmother and mother; Pancreatic cancer (age of onset: 63) in her mother; Skin cancer in her father and mother.      Objective:    Vitals:   06/26/18 1100  BP: 138/80  Pulse: 64    Physical Exam; well-developed older white female in no acute distress, pleasant, height 5 foot 3, weight 230, BMI 40.1.  HEENT; nontraumatic normocephalic EOMI PERRLA sclera anicteric, Oropharynx clear, Cardiovascular; regular rate and rhythm with S1-S2 no murmur rub or gallop, Pulmonary ;clear bilaterally, Abdomen; obese, soft nontender nondistended bowel sounds are active no palpable mass or hepatosplenomegaly, Rectal; exam is a small external  hemorrhoid, there is no fissure felt on exam, she is slightly tender on the left with fullness consistent with internal hemorrhoid.  Extremities; no clubbing cyanosis or edema skin warm dry, Neuro psych; alert and oriented, grossly nonfocal mood and affect appropriate       Assessment & Plan:   #[age] year old white female with history of sessile serrated polyps on colonoscopy July 2018, previous anal fissure, who comes in with anal rectal discomfort over the past few weeks without bleeding. On exam she has a small external hemorrhoid which may have previously been edematous, there is no anal fissure palpated, there is a small internal hemorrhoid on digital exam.  Plan; start Anusol HC suppositories at bedtime  for 5 days, she rate may repeat for 5 to 7 days as needed for recurrent symptoms. Patient will follow-up with Dr. Hilarie Fredrickson or myself as needed, and will be due for follow-up colonoscopy in 2023.  Omri Bertran S Tinzley Dalia PA-C 06/26/2018   Cc: [name], MD   Addendum: Reviewed and agree with initial management. Pyrtle, Lajuan Lines, MD

## 2018-07-02 DIAGNOSIS — R51 Headache: Secondary | ICD-10-CM | POA: Diagnosis not present

## 2018-07-02 DIAGNOSIS — I10 Essential (primary) hypertension: Secondary | ICD-10-CM | POA: Diagnosis not present

## 2018-07-02 DIAGNOSIS — M5431 Sciatica, right side: Secondary | ICD-10-CM | POA: Diagnosis not present

## 2018-07-02 DIAGNOSIS — R0981 Nasal congestion: Secondary | ICD-10-CM | POA: Diagnosis not present

## 2018-07-03 DIAGNOSIS — M5416 Radiculopathy, lumbar region: Secondary | ICD-10-CM | POA: Diagnosis not present

## 2018-07-14 DIAGNOSIS — I447 Left bundle-branch block, unspecified: Secondary | ICD-10-CM | POA: Diagnosis not present

## 2018-07-14 DIAGNOSIS — R0981 Nasal congestion: Secondary | ICD-10-CM | POA: Diagnosis not present

## 2018-07-14 NOTE — Telephone Encounter (Signed)
Not sure if this was ever taken care of. Phone note sent to Sophia who sent in medication for pt that day 8.1.19, but then sent back to me. Please check.

## 2018-07-14 NOTE — Telephone Encounter (Signed)
Called and LM for the patient to call me.  I wanted to know if she was able to get the suppositories, over the counter?  Asked patient to call me.

## 2018-07-15 NOTE — Telephone Encounter (Signed)
LM for the patient, advised she is welcome to call me back. I really wanted to know if she got my message about getting something over the counter for the hemorrhoids. Perperation H suppositories or a store brand would be fine.

## 2018-07-15 NOTE — Telephone Encounter (Signed)
Pt returned your call and would like a call back at her cell phone on file.

## 2018-07-16 NOTE — Telephone Encounter (Signed)
The patient got my message and she went to [place] and picked up some Preperation H suppositories.  She is going to try them this evening.  I did remind her to do the sitz baths.  I asked her to call me next week and let me know how she is doing.  She thanked me for caring.

## 2018-07-17 DIAGNOSIS — H01004 Unspecified blepharitis left upper eyelid: Secondary | ICD-10-CM | POA: Diagnosis not present

## 2018-07-17 DIAGNOSIS — H2513 Age-related nuclear cataract, bilateral: Secondary | ICD-10-CM | POA: Diagnosis not present

## 2018-07-17 DIAGNOSIS — H01001 Unspecified blepharitis right upper eyelid: Secondary | ICD-10-CM | POA: Diagnosis not present

## 2018-07-17 DIAGNOSIS — H5213 Myopia, bilateral: Secondary | ICD-10-CM | POA: Diagnosis not present

## 2018-07-18 DIAGNOSIS — Z1231 Encounter for screening mammogram for malignant neoplasm of breast: Secondary | ICD-10-CM | POA: Diagnosis not present

## 2018-07-22 DIAGNOSIS — Z8041 Family history of malignant neoplasm of ovary: Secondary | ICD-10-CM | POA: Diagnosis not present

## 2018-07-22 DIAGNOSIS — Z01419 Encounter for gynecological examination (general) (routine) without abnormal findings: Secondary | ICD-10-CM | POA: Diagnosis not present

## 2018-07-22 DIAGNOSIS — Z1151 Encounter for screening for human papillomavirus (HPV): Secondary | ICD-10-CM | POA: Diagnosis not present

## 2018-07-22 DIAGNOSIS — Z124 Encounter for screening for malignant neoplasm of cervix: Secondary | ICD-10-CM | POA: Diagnosis not present

## 2018-07-22 DIAGNOSIS — D279 Benign neoplasm of unspecified ovary: Secondary | ICD-10-CM | POA: Diagnosis not present

## 2018-07-22 DIAGNOSIS — Z113 Encounter for screening for infections with a predominantly sexual mode of transmission: Secondary | ICD-10-CM | POA: Diagnosis not present

## 2018-08-04 DIAGNOSIS — L814 Other melanin hyperpigmentation: Secondary | ICD-10-CM | POA: Diagnosis not present

## 2018-08-04 DIAGNOSIS — B0089 Other herpesviral infection: Secondary | ICD-10-CM | POA: Diagnosis not present

## 2018-08-04 DIAGNOSIS — L304 Erythema intertrigo: Secondary | ICD-10-CM | POA: Diagnosis not present

## 2018-08-25 DIAGNOSIS — E785 Hyperlipidemia, unspecified: Secondary | ICD-10-CM | POA: Diagnosis not present

## 2018-08-25 DIAGNOSIS — I10 Essential (primary) hypertension: Secondary | ICD-10-CM | POA: Diagnosis not present

## 2018-09-12 DIAGNOSIS — E039 Hypothyroidism, unspecified: Secondary | ICD-10-CM | POA: Diagnosis not present

## 2018-09-12 DIAGNOSIS — Z0000 Encounter for general adult medical examination without abnormal findings: Secondary | ICD-10-CM | POA: Diagnosis not present

## 2018-09-12 DIAGNOSIS — I10 Essential (primary) hypertension: Secondary | ICD-10-CM | POA: Diagnosis not present

## 2018-09-12 DIAGNOSIS — E559 Vitamin D deficiency, unspecified: Secondary | ICD-10-CM | POA: Diagnosis not present

## 2018-09-16 DIAGNOSIS — E039 Hypothyroidism, unspecified: Secondary | ICD-10-CM | POA: Diagnosis not present

## 2018-09-16 DIAGNOSIS — E559 Vitamin D deficiency, unspecified: Secondary | ICD-10-CM | POA: Diagnosis not present

## 2018-09-16 DIAGNOSIS — E7800 Pure hypercholesterolemia, unspecified: Secondary | ICD-10-CM | POA: Diagnosis not present

## 2018-09-16 DIAGNOSIS — Z0000 Encounter for general adult medical examination without abnormal findings: Secondary | ICD-10-CM | POA: Diagnosis not present

## 2018-09-29 DIAGNOSIS — R0981 Nasal congestion: Secondary | ICD-10-CM | POA: Diagnosis not present

## 2018-10-15 ENCOUNTER — Ambulatory Visit: Payer: BLUE CROSS/BLUE SHIELD | Admitting: Nurse Practitioner

## 2018-10-15 DIAGNOSIS — I8311 Varicose veins of right lower extremity with inflammation: Secondary | ICD-10-CM | POA: Diagnosis not present

## 2018-10-15 DIAGNOSIS — I8312 Varicose veins of left lower extremity with inflammation: Secondary | ICD-10-CM | POA: Diagnosis not present

## 2018-10-16 DIAGNOSIS — I8312 Varicose veins of left lower extremity with inflammation: Secondary | ICD-10-CM | POA: Diagnosis not present

## 2018-10-16 DIAGNOSIS — I8311 Varicose veins of right lower extremity with inflammation: Secondary | ICD-10-CM | POA: Diagnosis not present

## 2018-10-22 DIAGNOSIS — I8312 Varicose veins of left lower extremity with inflammation: Secondary | ICD-10-CM | POA: Diagnosis not present

## 2018-11-04 NOTE — Progress Notes (Signed)
[facility] PATIENT: Carolyn Mcguire DOB: [birth year]   REASON FOR VISIT: Follow-up for headache,  daytime somnolence HISTORY FROM: Patient    HISTORY OF PRESENT ILLNESS:UPDATE 12/11/2019[name] returns for follow-up with history of tension type headaches with migraine features.  Some of her headaches are related to stress and others are related to her tinnitus.  She does not want to be on any antidepressant.  She takes Maxalt acutely.  Since last seen she had lost some weight and felt much better however she has gained that back.  She was encouraged to try weight watchers.  She also has a history of mild obstructive sleep apnea however her insurance would not pay for dental appliance.  She continues to walk every day for exercise.  Odors are a migraine trigger for her.  She also has intermittent neck pain that causes headache.  She has a history of hypertension and hyperlipidemia and weight loss would be beneficial for both of these disease processes.  She returns for reevaluation    UPDATE3/20/19 CM  Carolyn Mcguire returns for follow-up with history of daily headaches tension type with migraine features.    We reviewed the records from [facility] tinnitus evaluation findings indicate hearing within normal limits in both ear, with a steeply sloping hearing loss in the extended high frequency only, 12.5-16 K hertz, speech recognition score was excellent, notably like stress and the psychological condition such as anxiety or depression cane exacerbate her symptoms, interfere with treatment, in her case, stress likely is the aggravating factors that contributes to her tinnitus disturbance.  She feels Maxalt works better for her headaches and Imitrex and she uses it 2-4 times a month.  Many times when she gets a headache she just uses an ice pack for about 10 minutes and it goes away.  She remains on Toprol 25 daily.  Sometimes her headaches start in  the back of the neck move up.  She had gotten a prescription for some physical therapy but could not afford to pay the co-pay.  Sleep study positive for mild obstructive sleep apnea however she could not afford the dental appliance to treat this.  She does say she walks every day for exercise she remains overweight she returns for reevaluation.  UPDATE 10/15/2018[name] returns for follow-up with a history of daily headaches tension type with migraine features. She has a lot of stress in her life but does not want to be on an antidepressant. When last seen she was given Imitrex to try acutely for her migraines however she felt it made her feel worse. She has been diagnosed with mild sleep apnea and instead of using CPAP she opted for a dental appliance. This referral was sent to her dentist but she has not heard anything. She has a history of hypertension and hyperlipidemia. She is aware that both of these conditions would benefit from her losing weight and exercising. She returns for reevaluation   UPDATE 07/13/2018[name] returns for follow-up. She has a history of daily headaches tension type with some migraine features. Due to the significant stressors in her life she was placed on Effexor by Dr. [name], however she never got the prescription filled and does not want to be on antidepressant. She had multiple family members pass away in short period of time. Today she also complains of daytime drowsiness she is morbidly obese and snores. She has a history of hypertension and hyperlipidemia.  MRI of the brain in 2017 was normal. She returns for reevaluation.  HISTORY 02/27/17 Carolyn Mcguire is a 62 years old right-handed Mcguire, seen in refer by  her primary care doctor [name] for evaluation of headache, initial evaluation was on February 27 2017. She has gone through stressful few years, multiple close family member passed away in a short period of time,  she had depression anxiety, around 2016, she began to have frequent headaches, vortex, occipital region pressure headaches, at the same time she had constant daily pulsatile tinnitus, Her headache has become daily, today she reported 6/10 headache,, she is only taking Tylenol as needed, she also complains of depression anxiety, Trigger for her headaches are exercise, stress, weather change, sleep deprivation, hot, noise, smell, perfume, smoke  Over the years, she was seen by ENT, and other specialists  I reviewed the records she brought from ENT [name], August 01 2016, audiogram in October 2013 was unremarkable, discrimination score was 96%,  MRI of the brain on Apr 25 2016 from [facility], unremarkable MRI of the brain and temporal bone,  [facility] orthopedic evaluation on December 07 2016, complained of neck pain, x-ray showed mild degenerative changes and facet disease at multiple levels, physical therapy was ordered, MRI of cervical spine was ordered, I also reviewed the record from [facility] tinnitus evaluation, findings indicate hearing within normal limits in both ear, with a steeply sloping hearing loss in the extended high frequency only, 12.5-16 K hertz, speech recognition score was excellent, notably like stress and the psychological condition such as anxiety or depression cane exacerbate her symptoms, interfere with treatment, in her case, stress likely is the aggravating factors that contributes to her tinnitus disturbance I reviewed and summarized referring note, she had a history of hypertension, hyperlipidemia, had a history of right hemithyroidectomy, for benign thyroid nodule, Laboratory evaluations in March 2018, normal BMP, CBC, TSH,  I personally reviewed cervical x-ray, mild degenerative changes   REVIEW OF SYSTEMS: Full 14 system review of systems performed and notable only for those listed, all others are neg:  Constitutional: Fatigue Cardiovascular:  neg Ear/Nose/Throat: Ringing in the ears Skin: neg Eyes: neg Respiratory: neg Gastroitestinal: neg  Hematology/Lymphatic: neg  Endocrine:neg Musculoskeletal: neg Allergy/Immunology: neg Neurological: Headache Psychiatric: neg Sleep : neg  ALLERGIES: Allergies  Allergen Reactions  . Sulfur Rash    MAKES PT RED    HOME MEDICATIONS: Outpatient Medications Prior to Visit  Medication Sig Dispense Refill  . Cholecalciferol (VITAMIN D3) 1000 units CAPS Take by mouth. One daily    . hydrochlorothiazide (MICROZIDE) 12.5 MG capsule Take 1 capsule (12.5 mg total) by mouth every other day. 90 capsule 3  . levothyroxine (SYNTHROID, LEVOTHROID) 88 MCG tablet Take 88 mcg by mouth daily before breakfast.    . losartan (COZAAR) 100 MG tablet Take 100 mg by mouth daily. as directed  3  . meloxicam (MOBIC) 15 MG tablet Take 15 mg by mouth daily as needed for pain.    . metoprolol succinate (TOPROL-XL) 25 MG 24 hr tablet TAKE 1 TABLET BY MOUTH DAILY 90 tablet 3  . rizatriptan (MAXALT-MLT) 10 MG disintegrating tablet Take 1 tablet (10 mg total) by mouth as needed for migraine. May repeat in 2 hours if needed 10 tablet 8  . rosuvastatin (CRESTOR) 10 MG tablet Take 10 mg by mouth daily.    . hydrocortisone (ANUSOL-HC) 25 MG suppository Place 1 suppository (25 mg total) rectally at bedtime as needed for hemorrhoids or anal  itching. (Patient not taking: Reported on 11/05/2018) 6 suppository 3  . potassium chloride (K-DUR) 10 MEQ tablet Take 1 tablet (10 mEq total) by mouth every other day. (Patient not taking: Reported on 11/05/2018) 90 tablet 3   No facility-administered medications prior to visit.     PAST MEDICAL HISTORY: Past Medical History:  Diagnosis Date  . Adenomatous colon polyp   . Allergic rhinitis   . Animal bite 05/2015   fox   . Anxiety   . Arthritis    shoulder  . Carpal tunnel syndrome    RIGHT HAND  . Depression   . Diastolic dysfunction    a. 2D echo 09/08/15: EF 55-60%,  grade 1 DD, elevated LVEDP, mild MR, mild TR. - normal BNP 08/2015.  Marland Kitchen Essential hypertension   . GERD (gastroesophageal reflux disease)   . Headache   . Heart murmur   . Hepatic steatosis   . Hyperlipidemia   . Hypothyroidism   . LBBB (left bundle branch block)    a. negative nuclear stress test 06/2011.  . Migraines   . Obesity   . OSA (obstructive sleep apnea)    mild    PAST SURGICAL HISTORY: Past Surgical History:  Procedure Laterality Date  . DERMOID CYST  EXCISION     OF HER RIGHT OVARY. SIZE OF A GRAPEFRUIT. OVARY IS STILL THERE.  Marland Kitchen HEMITHYROIDECTOMY    . RUPTURE BELLY BUTTON     WHEN SHE WAS AN INFANT  . TONSILECTOMY, ADENOIDECTOMY, BILATERAL MYRINGOTOMY AND TUBES     AGE 34, pt states she didn't have tubes put into her ears    FAMILY HISTORY: Family History  Problem Relation Age of Onset  . Ovarian cancer Mother        dx. 31-59; TAH-BSO at 22  . Diabetes Mother   . Pancreatic cancer Mother 66       d. 39  . Skin cancer Mother        maybe basal cell carcinoma  . Obesity Mother   . Heart disease Father   . Bladder Cancer Father        dx 54-81  . Skin cancer Father        possibly melanoma; +sun exposure  . Cancer Brother 64       salivary gland cancer; d. 44; no tobacco use  . Colon cancer Maternal Uncle        dx late 48s; d. early 1s  . Ovarian cancer Maternal Grandmother        d. 77; w/ mets  . Breast cancer Maternal Aunt        dx unspecified age; s/p mastectomy  . Other Maternal Aunt        intellectual disabilities  . Cancer Paternal Uncle        CLL, dx. 47  . Heart attack Maternal Grandfather        d. 23  . Heart attack Maternal Uncle 51  . Heart failure Maternal Aunt 86       acute diastolic heart failure  . Esophageal cancer Neg Hx   . Rectal cancer Neg Hx   . Stomach cancer Neg Hx     SOCIAL HISTORY: Social History   Socioeconomic History  . Marital status: Single    Spouse name: Not on file  . Number of children: 0  .  Years of education: HS  . Highest education level: Not on file  Occupational History  . Occupation: Unemployed  Social Needs  .  Financial resource strain: Not on file  . Food insecurity:    Worry: Not on file    Inability: Not on file  . Transportation needs:    Medical: Not on file    Non-medical: Not on file  Tobacco Use  . Smoking status: Never Smoker  . Smokeless tobacco: Never Used  Substance and Sexual Activity  . Alcohol use: No  . Drug use: No  . Sexual activity: Not Currently  Lifestyle  . Physical activity:    Days per week: Not on file    Minutes per session: Not on file  . Stress: Not on file  Relationships  . Social connections:    Talks on phone: Not on file    Gets together: Not on file    Attends religious service: Not on file    Active member of club or organization: Not on file    Attends meetings of clubs or organizations: Not on file    Relationship status: Not on file  . Intimate partner violence:    Fear of current or ex partner: Not on file    Emotionally abused: Not on file    Physically abused: Not on file    Forced sexual activity: Not on file  Other Topics Concern  . Not on file  Social History Narrative   Lives at home alone.   Right-handed.   She does not drink caffeine.  She will occasionally eat chocolate.     PHYSICAL EXAM  Vitals:   11/05/18 0750  BP: 132/86  Pulse: 68  Weight: 242 lb 9.6 oz (110 kg)  Height: 5' 4.5" (1.638 m)   Body mass index is 41 kg/m.  Generalized: Well developed,Obese Mcguire in no acute distress  Head: normocephalic and atraumatic,. Oropharynx benign  Neck: Supple, paraspinals tender to palpation Musculoskeletal: No deformity   Neurological examination   Mentation: Alert oriented to time, place, history taking. Attention span and concentration appropriate. Recent and remote memory intact.  Follows all commands speech and language fluent.   Cranial nerve II-XII: .Pupils were equal round reactive  to light extraocular movements were full, visual field were full on confrontational test. Facial sensation and strength were normal. hearing was intact to finger rubbing bilaterally. Uvula tongue midline. head turning and shoulder shrug were normal and symmetric.Tongue protrusion into cheek strength was normal. Motor: normal bulk and tone, full strength in the BUE, BLE, fine finger movements normal, no pronator drift. No focal weakness Sensory: normal and symmetric to light touch, on the face arms and legs Coordination: finger-nose-finger, heel-to-shin bilaterally, no dysmetria Reflexes: Symmetric upper and lower, plantar responses were flexor bilaterally. Gait and Station: Rising up from seated position without assistance, normal stance,  moderate stride, good arm swing, smooth turning, able to perform tiptoe, and heel walking without difficulty. Tandem gait is steady. No assistive device  DIAGNOSTIC DATA (LABS, IMAGING, TESTING) - I reviewed patient records, labs, notes, testing and imaging myself where available.  Lab Results  Component Value Date   WBC 8.5 09/16/2015   HGB 14.4 09/16/2015   HCT 40.1 09/16/2015   MCV 83.9 09/16/2015   PLT 213 09/16/2015      Component Value Date/Time   NA 142 06/02/2018 1114   K 4.0 06/02/2018 1114   CL 102 06/02/2018 1114   CO2 24 06/02/2018 1114   GLUCOSE 109 (H) 06/02/2018 1114   GLUCOSE 89 08/02/2016 1127   BUN 18 06/02/2018 1114   CREATININE 0.92 06/02/2018 1114   CREATININE 0.84  08/02/2016 1127   CALCIUM 9.2 06/02/2018 1114   PROT 7.2 09/16/2015 1026   ALBUMIN 4.4 09/16/2015 1026   AST 18 09/16/2015 1026   ALT 15 09/16/2015 1026   ALKPHOS 85 09/16/2015 1026   BILITOT 0.9 09/16/2015 1026   GFRNONAA 68 06/02/2018 1114   GFRAA 79 06/02/2018 1114    ASSESSMENT AND PLAN  62 y.o. year old Mcguire  has a past medical history of  Allergic rhinitis;  Anxiety; Arthritis; Carpal tunnel syndrome; Depression; Diastolic dysfunction; Essential  hypertension;  Headache;  Hepatic steatosis; Hyperlipidemia; Hypothyroidism; LBBB (left bundle branch block); Migraines; Obesity; here for follow-up for her headaches . Sleep study shows mild obstructive sleep apnea and she opted for dental appliance, but was unable to afford.  She does not want to be on an antidepressant for her increased stress.              Continue Maxalt 10mg  MLT prn acutely will refill Continue to avoid  migraine triggers patient was given a list of food triggers and this was reviewed with her Given neck exercises to perform every day Continue walking for overall health and well-being Consider Weight watchers for weight control Weight loss will help your overall medical condition as well as your obstructive sleep apnea Call for worsening symptoms Follow-up in 1 year I spent 25 min utes in total face to face time with the patient more than 50% of which was spent counseling and coordination of care, reviewing test results reviewing medications and discussing and reviewing the diagnosis of migraine and neck pain and further treatment options.  Patient was given written information on neck exercises and migraine triggers. Dennie Bible, [facility], [facility], APRN  [facility] [address] [place], [place] [address] [phone]

## 2018-11-05 ENCOUNTER — Ambulatory Visit: Payer: BLUE CROSS/BLUE SHIELD | Admitting: Nurse Practitioner

## 2018-11-05 ENCOUNTER — Encounter: Payer: Self-pay | Admitting: Nurse Practitioner

## 2018-11-05 ENCOUNTER — Encounter

## 2018-11-05 VITALS — BP 132/86 | HR 68 | Ht 64.5 in | Wt 242.6 lb

## 2018-11-05 DIAGNOSIS — R400 Somnolence: Secondary | ICD-10-CM

## 2018-11-05 DIAGNOSIS — R51 Headache: Secondary | ICD-10-CM

## 2018-11-05 DIAGNOSIS — R519 Headache, unspecified: Secondary | ICD-10-CM

## 2018-11-05 MED ORDER — RIZATRIPTAN BENZOATE 10 MG PO TBDP: 10.0000 mg | ORAL_TABLET | ORAL | 11 refills | Status: DC | PRN

## 2018-11-05 NOTE — Progress Notes (Signed)
I have reviewed and agreed above plan. 

## 2018-11-05 NOTE — Patient Instructions (Signed)
Continue Maxalt 10mg  MLT prn acutely will refill Continue to avoid  migraine triggers  Given neck exercises to perform every day Continue walking for overall health and well-being Consider Weight watchers for weight control Weight loss will help your overall medical condition as well as your obstructive sleep apnea Call for worsening symptoms Follow-up in 1 year

## 2018-11-07 DIAGNOSIS — L821 Other seborrheic keratosis: Secondary | ICD-10-CM | POA: Diagnosis not present

## 2018-11-07 DIAGNOSIS — L300 Nummular dermatitis: Secondary | ICD-10-CM | POA: Diagnosis not present

## 2018-11-07 DIAGNOSIS — B078 Other viral warts: Secondary | ICD-10-CM | POA: Diagnosis not present

## 2018-11-10 DIAGNOSIS — H00011 Hordeolum externum right upper eyelid: Secondary | ICD-10-CM | POA: Diagnosis not present

## 2018-11-10 DIAGNOSIS — H0100A Unspecified blepharitis right eye, upper and lower eyelids: Secondary | ICD-10-CM | POA: Diagnosis not present

## 2018-11-10 DIAGNOSIS — H0100B Unspecified blepharitis left eye, upper and lower eyelids: Secondary | ICD-10-CM | POA: Diagnosis not present

## 2018-12-15 ENCOUNTER — Ambulatory Visit: Payer: BLUE CROSS/BLUE SHIELD | Admitting: Nurse Practitioner

## 2019-01-06 ENCOUNTER — Ambulatory Visit: Payer: BLUE CROSS/BLUE SHIELD | Admitting: Cardiovascular Disease

## 2019-01-06 ENCOUNTER — Encounter: Payer: Self-pay | Admitting: Cardiovascular Disease

## 2019-01-06 ENCOUNTER — Encounter (INDEPENDENT_AMBULATORY_CARE_PROVIDER_SITE_OTHER): Payer: Self-pay

## 2019-01-06 VITALS — BP 130/76 | HR 88 | Ht 64.5 in | Wt 246.8 lb

## 2019-01-06 DIAGNOSIS — I447 Left bundle-branch block, unspecified: Secondary | ICD-10-CM | POA: Diagnosis not present

## 2019-01-06 DIAGNOSIS — E785 Hyperlipidemia, unspecified: Secondary | ICD-10-CM | POA: Diagnosis not present

## 2019-01-06 DIAGNOSIS — I10 Essential (primary) hypertension: Secondary | ICD-10-CM

## 2019-01-06 NOTE — Progress Notes (Signed)
Carolyn Mcguire Date of Birth  1955/12/14 [facility] [address]    [place], [place]  [address] [phone]  Fax  [phone]  Problem list: 1. Left bundle branch block 2. Hypothyroidism 3. Hyperlipidemia 4. Hypertension 5. Dyspnea on exertion      63 yo female with a hx of LBBB.  Stress myoview was negative.  She's been on a diet and exercise program and has lost about 6 pounds since last year.  She is drinking more water, avoiding fast foods.  She is walking regularly.  She has been diagnosed with migraines.  She thinks muscle tension may be contributing to her headaches.  Oct. 5, 2016:  Carolyn Mcguire is seen today for follow up of her shortness of breath Has had many deaths in her family and became depressed . Was started on Welbutrin for depression .  She was seen in the ER. Was told by the pharmacist at her primary medical doctors office thought it was due to the discontinuation of her Welbutrin. She is still not feeling 100% better so she was scheduled to see me .  She is still short of breath.    DOE  With walking up from the buildings in the back of her property.  She does not getting any regular exercise. She was bitten by a fox on July 11.  Had a series of rabies shots ( the fox was rabid)  Is retired .   She has had a stressful 2 years.    Several deaths in her family . Has not had the time or inclination  Wants to start a walking program.     No CP   February 21, 2016: Doing well Has lost some weight .   Has lost 9 lbs since I last saw her  Walks regularly   Sept. 7, 2017: Doing well BP is doing well at home. Still walking some  Has been to a nutritionist.   March 2 , 2108: Carolyn Mcguire is seen today .  Follow up for hypertension and hyper-hyperlipidemia, and hypothyroidism. BP is well controlled.   February 28, 2018: Carolyn Mcguire is seen today for follow-up of her hypertension and hyperlipidemia.  She has a left bundle branch block. BP has been ok.   A bit  hight today  Wt = 250  ( up 7 lbs from last year )  Has been eating out lots  .     January 06, 2019: Carolyn Mcguire is seen today for follow-up of her hypertension and hyperlipidemia.  She has a left bundle branch block that is old.  Weight today is 246 pounds which is down 3 pounds from her last visit.  BP is well controlled Has DOE from her weight   Current Outpatient Medications on File Prior to Visit  Medication Sig Dispense Refill  . Cholecalciferol (VITAMIN D3) 1000 units CAPS Take by mouth. One daily    . hydrochlorothiazide (MICROZIDE) 12.5 MG capsule Take 1 capsule (12.5 mg total) by mouth every other day. 90 capsule 3  . hydrocortisone (ANUSOL-HC) 25 MG suppository Place 1 suppository (25 mg total) rectally at bedtime as needed for hemorrhoids or anal itching. 6 suppository 3  . levothyroxine (SYNTHROID, LEVOTHROID) 88 MCG tablet Take 88 mcg by mouth daily before breakfast.    . losartan (COZAAR) 100 MG tablet Take 100 mg by mouth daily. as directed  3  . metoprolol succinate (TOPROL-XL) 25 MG 24 hr tablet TAKE 1 TABLET BY MOUTH DAILY 90 tablet 3  .  rizatriptan (MAXALT-MLT) 10 MG disintegrating tablet Take 1 tablet (10 mg total) by mouth as needed for migraine. May repeat in 2 hours if needed 10 tablet 11  . rosuvastatin (CRESTOR) 10 MG tablet Take 10 mg by mouth daily.     No current facility-administered medications on file prior to visit.     Allergies  Allergen Reactions  . Sulfur Rash    MAKES PT RED    Past Medical History:  Diagnosis Date  . Adenomatous colon polyp   . Allergic rhinitis   . Animal bite 05/2015   fox   . Anxiety   . Arthritis    shoulder  . Carpal tunnel syndrome    RIGHT HAND  . Depression   . Diastolic dysfunction    a. 2D echo 09/08/15: EF 55-60%, grade 1 DD, elevated LVEDP, mild MR, mild TR. - normal BNP 08/2015.  Marland Kitchen Essential hypertension   . GERD (gastroesophageal reflux disease)   . Headache   . Heart murmur   . Hepatic steatosis   .  Hyperlipidemia   . Hypothyroidism   . LBBB (left bundle branch block)    a. negative nuclear stress test 06/2011.  . Migraines   . Obesity   . OSA (obstructive sleep apnea)    mild    Past Surgical History:  Procedure Laterality Date  . DERMOID CYST  EXCISION     OF HER RIGHT OVARY. SIZE OF A GRAPEFRUIT. OVARY IS STILL THERE.  Marland Kitchen HEMITHYROIDECTOMY    . RUPTURE BELLY BUTTON     WHEN SHE WAS AN INFANT  . TONSILECTOMY, ADENOIDECTOMY, BILATERAL MYRINGOTOMY AND TUBES     AGE 70, pt states she didn't have tubes put into her ears    Social History   Tobacco Use  Smoking Status Never Smoker  Smokeless Tobacco Never Used    Social History   Substance and Sexual Activity  Alcohol Use No    Family History  Problem Relation Age of Onset  . Ovarian cancer Mother        dx. 35-59; TAH-BSO at 63  . Diabetes Mother   . Pancreatic cancer Mother 67       d. 63  . Skin cancer Mother        maybe basal cell carcinoma  . Obesity Mother   . Heart disease Father   . Bladder Cancer Father        dx 29-81  . Skin cancer Father        possibly melanoma; +sun exposure  . Cancer Brother 65       salivary gland cancer; d. 63; no tobacco use  . Colon cancer Maternal Uncle        dx late 19s; d. early 63s  . Ovarian cancer Maternal Grandmother        d. 55; w/ mets  . Breast cancer Maternal Aunt        dx unspecified age; s/p mastectomy  . Other Maternal Aunt        intellectual disabilities  . Cancer Paternal Uncle        CLL, dx. 70  . Heart attack Maternal Grandfather        d. 36  . Heart attack Maternal Uncle 51  . Heart failure Maternal Aunt 86       acute diastolic heart failure  . Esophageal cancer Neg Hx   . Rectal cancer Neg Hx   . Stomach cancer Neg Hx     Reviw  of Systems:  Noted in current history, all other systems are negative.Marland Kitchen  Physical Exam: Blood pressure 130/76, pulse 88, height 5' 4.5" (1.638 m), weight 246 lb 12.8 oz (111.9 kg), SpO2 95 %.  GEN:  Well  nourished, well developed in no acute distress HEENT: Normal NECK: No JVD; No carotid bruits LYMPHATICS: No lymphadenopathy CARDIAC: RRR , no murmurs, rubs, gallops RESPIRATORY:  Clear to auscultation without rales, wheezing or rhonchi  ABDOMEN: Soft, non-tender, non-distended MUSCULOSKELETAL:  No edema; No deformity  SKIN: Warm and dry NEUROLOGIC:  Alert and oriented x 3     ECG: January 06, 2019: Normal sinus rhythm at 88.  Left bundle branch block.  Assessment / Plan:   1. Left bundle branch block-  Stable    2. Hypothyroidism-    Managed by her primary   3. Hyperlipidemia - -    4. Hypertension -   BP is well controlled     5. Dyspnea on exertion-  Likely related to her weight  Continue weight loss efforts.    Mertie Moores, MD  01/06/2019 3:45 PM    [address],  [place], [place]  [address] Pager [phone] Phone: [phone]; Fax: [phone]

## 2019-01-06 NOTE — Patient Instructions (Signed)

## 2019-01-08 ENCOUNTER — Telehealth: Payer: Self-pay | Admitting: Cardiovascular Disease

## 2019-01-08 NOTE — Telephone Encounter (Signed)
New Message   PT is calling because when PT checks her BP on her machine at home, sometimes it flashes irregular heart beat and she doesn't know if she should be concerned. Please call

## 2019-01-08 NOTE — Telephone Encounter (Signed)
LMTCB

## 2019-01-09 NOTE — Telephone Encounter (Signed)
Called patient and advised her that her ECG from her ov on 2/11 with Dr. Acie Fredrickson showed NSR with LBBB which is not new. I answered her questions and advised that home monitor may be picking up PAC/PVC. I advised her to call back if she notices symptoms of fluttering, SOB, chest discomfort or other concerns. She thanked me for the call.

## 2019-01-13 ENCOUNTER — Ambulatory Visit: Payer: BLUE CROSS/BLUE SHIELD | Admitting: Nurse Practitioner

## 2019-01-19 DIAGNOSIS — M25521 Pain in right elbow: Secondary | ICD-10-CM | POA: Diagnosis not present

## 2019-02-09 ENCOUNTER — Other Ambulatory Visit: Payer: Self-pay | Admitting: Cardiovascular Disease

## 2019-04-30 DIAGNOSIS — M25572 Pain in left ankle and joints of left foot: Secondary | ICD-10-CM | POA: Diagnosis not present

## 2019-04-30 DIAGNOSIS — M25571 Pain in right ankle and joints of right foot: Secondary | ICD-10-CM | POA: Diagnosis not present

## 2019-05-11 DIAGNOSIS — L600 Ingrowing nail: Secondary | ICD-10-CM | POA: Diagnosis not present

## 2019-05-11 DIAGNOSIS — M79671 Pain in right foot: Secondary | ICD-10-CM | POA: Diagnosis not present

## 2019-05-11 DIAGNOSIS — M722 Plantar fascial fibromatosis: Secondary | ICD-10-CM | POA: Diagnosis not present

## 2019-05-11 DIAGNOSIS — M79672 Pain in left foot: Secondary | ICD-10-CM | POA: Diagnosis not present

## 2019-06-08 DIAGNOSIS — L905 Scar conditions and fibrosis of skin: Secondary | ICD-10-CM | POA: Diagnosis not present

## 2019-06-08 DIAGNOSIS — D225 Melanocytic nevi of trunk: Secondary | ICD-10-CM | POA: Diagnosis not present

## 2019-06-08 DIAGNOSIS — L570 Actinic keratosis: Secondary | ICD-10-CM | POA: Diagnosis not present

## 2019-06-08 DIAGNOSIS — L814 Other melanin hyperpigmentation: Secondary | ICD-10-CM | POA: Diagnosis not present

## 2019-06-08 DIAGNOSIS — L821 Other seborrheic keratosis: Secondary | ICD-10-CM | POA: Diagnosis not present

## 2019-06-27 NOTE — Progress Notes (Signed)
PATIENT: Carolyn Mcguire DOB: Sep 16, 1956  REASON FOR VISIT: follow up HISTORY FROM: patient  HISTORY OF PRESENT ILLNESS: Today 06/29/19  HISTORY HISTORY 02/27/[name] Carolyn Mcguire a 63 years old right-handed female, seen in refer by her primary care doctor Lisabeth Devoid evaluation of headache, initial evaluation was on February 27 2017. She has gone through stressful few years, multiple close family member passed away in a short period of time, she had depression anxiety, around 2016, she began to have frequent headaches, vortex, occipital region pressure headaches, at the same time she had constant daily pulsatile tinnitus, Her headache has become daily, today she reported 6/10 headache,, she is only taking Tylenol as needed, she also complains of depression anxiety, Triggerfor her headaches areexercise, stress, weather change, sleep deprivation, hot, noise, smell, perfume, smoke Over the years, she was seen by ENT, and other specialists  I reviewed the records she brought from ENT [name], August 01 2016, audiogramin October 2013 was unremarkable, discrimination score was 96%,  MRI of the brain on Apr 25 2016 from [facility], unremarkable MRI of the brain and temporal bone,  [facility] orthopedic evaluation on December 07 2016, complained of neck pain, x-ray showed mild degenerative changes and facet disease at multiple levels, physical therapy was ordered, MRI of cervical spine was ordered, I also reviewed the record from [facility] tinnitus evaluation, findings indicate hearing within normal limits in both ear, with a steeply sloping hearing loss in the extended high frequency only, 12.5-16 K hertz, speech recognition score was excellent, notably like stress and the psychological condition such as anxiety or depression cane exacerbate hersymptoms, interfere with treatment, in her case, stress likely is theaggravating factors that contributes to her tinnitus  disturbance I reviewed and summarized referring note, she had a history of hypertension, hyperlipidemia, had a history of right hemithyroidectomy, for benign thyroid nodule, Laboratory evaluations in March 2018, normal BMP, CBC, TSH,  I personally reviewed cervical x-ray, mild degenerative changes UPDATE 07/13/2018CM Ms Carolyn Mcguire, 63 year old female returns for follow-up. She has a history of daily headaches tension type with some migraine features. Due to the significant stressors in her life she was placed on Effexor by Dr. [name], however she never got the prescription filled and does not want to be on antidepressant. She had multiple family members pass away in short period of time. Today she also complains of daytime drowsiness she is morbidly obese and snores. She has a history of hypertension and hyperlipidemia. MRI of the brain in 2017 was normal. She returns for reevaluation. 10/15/2018CM Ms. Carolyn Mcguire, 63 year old female returns for follow-up with a history of daily headaches tension type with migraine features. She has a lot of stress in her life but does not want to be on an antidepressant. When last seen she was given Imitrex to try acutely for her migraines however she felt it made her feel worse. She has been diagnosed with mild sleep apnea and instead of using CPAP she opted for a dental appliance. This referral was sent to her dentist but she has not heard anything. She has a history of hypertension and hyperlipidemia. She is aware that both of these conditions would benefit from her losing weight and exercising. She returns for reevaluation UPDATE3/20/19 CM  Ms.Carolyn Mcguire, 63 year old female returns for follow-up with history of daily headaches tension type with migraine features.    We reviewed the records from [facility] tinnitus evaluation findings indicate hearing within normal limits in both ear, with a steeply sloping hearing  loss in the extended high frequency only, 12.5-16 K hertz, speech recognition  score was excellent, notably like stress and the psychological condition such as anxiety or depression cane exacerbate hersymptoms, interfere with treatment, in her case, stress likely is theaggravating factors that contributes to her tinnitus disturbance.  She feels Maxalt works better for her headaches and Imitrex and she uses it 2-4 times a month.  Many times when she gets a headache she just uses an ice pack for about 10 minutes and it goes away.  She remains on Toprol 25 daily.  Sometimes her headaches start in the back of the neck move up.  She had gotten a prescription for some physical therapy but could not afford to pay the co-pay.  Sleep study positive for mild obstructive sleep apnea however she could not afford the dental appliance to treat this.  She does say she walks every day for exercise she remains overweight she returns for reevaluation. 12/11/2019CM Ms. Carolyn Mcguire, 63 year old female returns for follow-up with history of tension type headaches with migraine features.  Some of her headaches are related to stress and others are related to her tinnitus.  She does not want to be on any antidepressant.  She takes Maxalt acutely.  Since last seen she had lost some weight and felt much better however she has gained that back.  She was encouraged to try weight watchers.  She also has a history of mild obstructive sleep apnea however her insurance would not pay for dental appliance.  She continues to walk every day for exercise.  Odors are a migraine trigger for her.  She also has intermittent neck pain that causes headache.  She has a history of hypertension and hyperlipidemia and weight loss would be beneficial for both of these disease processes.  She returns for reevaluation Update June 28, 7565 SS: 63 year old with history of tension type headaches and migraine features.  She does not want to take an antidepressant, she remains on Maxalt as needed.  She has mild OSA, was unable to afford a dental  appliance. Today, she thinks her headaches may be worse. She has daily headache, pounding, occasional photophobia.  She says she wakes up with a headache in the morning.  At night the pain is worse. She is able to go about her daily activities.  She says the pain is located to her entire head.  She does have history of tinnitus.  Her ENT doctor recently retired.  She says since last visit she has gained 20 pounds. She is not very active.    REVIEW OF SYSTEMS: Out of a complete 14 system review of symptoms, the patient complains only of the following symptoms, and all other reviewed systems are negative.  Headaches, weight gain   ALLERGIES: Allergies  Allergen Reactions   Sulfur Rash    MAKES PT RED    HOME MEDICATIONS: Outpatient Medications Prior to Visit  Medication Sig Dispense Refill   Cholecalciferol (VITAMIN D3) 1000 units CAPS Take by mouth. One daily     ciclopirox (PENLAC) 8 % solution APPLY TOPICALLY TO AFFECTED TOE NAILS D     hydrochlorothiazide (MICROZIDE) 12.5 MG capsule Take 1 capsule (12.5 mg total) by mouth every other day. 90 capsule 3   hydrocortisone (ANUSOL-HC) 25 MG suppository Place 1 suppository (25 mg total) rectally at bedtime as needed for hemorrhoids or anal itching. 6 suppository 3   levothyroxine (SYNTHROID, LEVOTHROID) 88 MCG tablet Take 88 mcg by mouth daily before breakfast.  losartan (COZAAR) 100 MG tablet Take 100 mg by mouth daily. as directed  3   metoprolol succinate (TOPROL-XL) 25 MG 24 hr tablet TAKE 1 TABLET BY MOUTH DAILY 90 tablet 3   rizatriptan (MAXALT-MLT) 10 MG disintegrating tablet Take 1 tablet (10 mg total) by mouth as needed for migraine. May repeat in 2 hours if needed 10 tablet 11   rosuvastatin (CRESTOR) 10 MG tablet Take 10 mg by mouth daily.     No facility-administered medications prior to visit.     PAST MEDICAL HISTORY: Past Medical History:  Diagnosis Date   Adenomatous colon polyp    Allergic rhinitis     Animal bite 05/2015   fox    Anxiety    Arthritis    shoulder   Carpal tunnel syndrome    RIGHT HAND   Depression    Diastolic dysfunction    a. 2D echo 09/08/15: EF 55-60%, grade 1 DD, elevated LVEDP, mild MR, mild TR. - normal BNP 08/2015.   Essential hypertension    GERD (gastroesophageal reflux disease)    Headache    Heart murmur    Hepatic steatosis    Hyperlipidemia    Hypothyroidism    LBBB (left bundle branch block)    a. negative nuclear stress test 06/2011.   Migraines    Obesity    OSA (obstructive sleep apnea)    mild    PAST SURGICAL HISTORY: Past Surgical History:  Procedure Laterality Date   DERMOID CYST  EXCISION     OF HER RIGHT OVARY. SIZE OF A GRAPEFRUIT. OVARY IS STILL THERE.   HEMITHYROIDECTOMY     RUPTURE BELLY BUTTON     WHEN SHE WAS AN INFANT   TONSILECTOMY, ADENOIDECTOMY, BILATERAL MYRINGOTOMY AND TUBES     AGE 75, pt states she didn't have tubes put into her ears    FAMILY HISTORY: Family History  Problem Relation Age of Onset   Ovarian cancer Mother        dx. 59-59; TAH-BSO at 65   Diabetes Mother    Pancreatic cancer Mother 56       d. 27   Skin cancer Mother        maybe basal cell carcinoma   Obesity Mother    Heart disease Father    Bladder Cancer Father        dx 1-81   Skin cancer Father        possibly melanoma; +sun exposure   Cancer Brother 69       salivary gland cancer; d. 11; no tobacco use   Colon cancer Maternal Uncle        dx late 33s; d. early 5s   Ovarian cancer Maternal Grandmother        d. 67; w/ mets   Breast cancer Maternal Aunt        dx unspecified age; s/p mastectomy   Other Maternal Aunt        intellectual disabilities   Cancer Paternal Uncle        CLL, dx. 58   Heart attack Maternal Grandfather        d. 72   Heart attack Maternal Uncle 51   Heart failure Maternal Aunt 86       acute diastolic heart failure   Esophageal cancer Neg Hx    Rectal cancer  Neg Hx    Stomach cancer Neg Hx     SOCIAL HISTORY: Social History   Socioeconomic History  Marital status: Single    Spouse name: Not on file   Number of children: 0   Years of education: HS   Highest education level: Not on file  Occupational History   Occupation: Unemployed  Scientist, product/process development strain: Not on file   Food insecurity    Worry: Not on file    Inability: Not on file   Transportation needs    Medical: Not on file    Non-medical: Not on file  Tobacco Use   Smoking status: Never Smoker   Smokeless tobacco: Never Used  Substance and Sexual Activity   Alcohol use: No   Drug use: No   Sexual activity: Not Currently  Lifestyle   Physical activity    Days per week: Not on file    Minutes per session: Not on file   Stress: Not on file  Relationships   Social connections    Talks on phone: Not on file    Gets together: Not on file    Attends religious service: Not on file    Active member of club or organization: Not on file    Attends meetings of clubs or organizations: Not on file    Relationship status: Not on file   Intimate partner violence    Fear of current or ex partner: Not on file    Emotionally abused: Not on file    Physically abused: Not on file    Forced sexual activity: Not on file  Other Topics Concern   Not on file  Social History Narrative   Lives at home alone.   Right-handed.   She does not drink caffeine.  She will occasionally eat chocolate.      PHYSICAL EXAM  Vitals:   06/29/19 1050  BP: (!) 160/84  Pulse: 68  Temp: (!) 97.3 F (36.3 C)  Weight: 251 lb 3.2 oz (113.9 kg)  Height: 5' 4.5" (1.638 m)   Body mass index is 42.45 kg/m.  Generalized: Well developed, in no acute distress   Neurological examination  Mentation: Alert oriented to time, place, history taking. Follows all commands speech and language fluent Cranial nerve II-XII: Pupils were equal round reactive to light.  Extraocular movements were full, visual field were full on confrontational test. Facial sensation and strength were normal. Head turning and shoulder shrug  were normal and symmetric. Motor: The motor testing reveals 5 over 5 strength of all 4 extremities. Good symmetric motor tone is noted throughout.  Sensory: Sensory testing is intact to soft touch on all 4 extremities. No evidence of extinction is noted.  Coordination: Cerebellar testing reveals good finger-nose-finger and heel-to-shin bilaterally.  Gait and station: Gait is normal. Tandem gait is normal. Romberg is negative. No drift is seen.  Reflexes: Deep tendon reflexes are symmetric and normal bilaterally.   DIAGNOSTIC DATA (LABS, IMAGING, TESTING) - I reviewed patient records, labs, notes, testing and imaging myself where available.  Lab Results  Component Value Date   WBC 8.5 09/16/2015   HGB 14.4 09/16/2015   HCT 40.1 09/16/2015   MCV 83.9 09/16/2015   PLT 213 09/16/2015      Component Value Date/Time   NA 142 06/02/2018 1114   K 4.0 06/02/2018 1114   CL 102 06/02/2018 1114   CO2 24 06/02/2018 1114   GLUCOSE 109 (H) 06/02/2018 1114   GLUCOSE 89 08/02/2016 1127   BUN 18 06/02/2018 1114   CREATININE 0.92 06/02/2018 1114   CREATININE 0.84 08/02/2016 1127  CALCIUM 9.2 06/02/2018 1114   PROT 7.2 09/16/2015 1026   ALBUMIN 4.4 09/16/2015 1026   AST 18 09/16/2015 1026   ALT 15 09/16/2015 1026   ALKPHOS 85 09/16/2015 1026   BILITOT 0.9 09/16/2015 1026   GFRNONAA 68 06/02/2018 1114   GFRAA 79 06/02/2018 1114   No results found for: CHOL, HDL, LDLCALC, LDLDIRECT, TRIG, CHOLHDL No results found for: HGBA1C No results found for: VITAMINB12 Lab Results  Component Value Date   TSH 1.267 09/16/2015    ASSESSMENT AND PLAN 63 y.o. year old female  has a past medical history of Adenomatous colon polyp, Allergic rhinitis, Animal bite (05/2015), Anxiety, Arthritis, Carpal tunnel syndrome, Depression, Diastolic dysfunction,  Essential hypertension, GERD (gastroesophageal reflux disease), Headache, Heart murmur, Hepatic steatosis, Hyperlipidemia, Hypothyroidism, LBBB (left bundle branch block), Migraines, Obesity, and OSA (obstructive sleep apnea). here with:  1. Chronic headaches with some migraine features -MRI of the brain May 2017 at [facility] was unremarkable -She has tried Effexor in the past, is on Toprol now, she does not want to try another antidepressant -She reports daily headache, wakes up in the morning with headache -I suggested we try Topamax for headache prevention, could also be beneficial for weight loss -She can continue Maxalt, is taking 1-2 a week, mostly at night if she can't sleep, does not need refill at this time -I advised her to consider weight watchers, incorporate exercise -She does not want to start any medication, wants to see if she can lose weight and that may improve her headaches, she will let me know if she would like to try Topamax -She has been told she has mild OSA, never got dental device due to cost -She will follow-up in 6 months or sooner if needed   I spent 15 minutes with the patient. 50% of this time was spent discussing her plan of care.   Butler Denmark, AGNP-C, DNP 06/29/2019, 11:01 AM [facility] [address], [place], [place] [address] [phone]

## 2019-06-29 ENCOUNTER — Other Ambulatory Visit: Payer: Self-pay

## 2019-06-29 ENCOUNTER — Ambulatory Visit: Payer: BC Managed Care – PPO | Admitting: Neurology

## 2019-06-29 ENCOUNTER — Encounter: Payer: Self-pay | Admitting: Neurology

## 2019-06-29 VITALS — BP 156/76 | HR 68 | Temp 97.3°F | Ht 64.5 in | Wt 251.2 lb

## 2019-06-29 DIAGNOSIS — R51 Headache: Secondary | ICD-10-CM | POA: Diagnosis not present

## 2019-06-29 DIAGNOSIS — R519 Headache, unspecified: Secondary | ICD-10-CM

## 2019-06-29 NOTE — Patient Instructions (Addendum)
-  You may consider Topamax for prevention for headache. This may also help with weight loss. You can continue Maxalt as needed. I will see you in 6 months.  -Consider weight watchers -Daily physical exercise

## 2019-07-01 NOTE — Progress Notes (Signed)
I have reviewed and agreed above plan. 

## 2019-07-24 DIAGNOSIS — Z01419 Encounter for gynecological examination (general) (routine) without abnormal findings: Secondary | ICD-10-CM | POA: Diagnosis not present

## 2019-07-24 DIAGNOSIS — Z1151 Encounter for screening for human papillomavirus (HPV): Secondary | ICD-10-CM | POA: Diagnosis not present

## 2019-07-24 DIAGNOSIS — Z8041 Family history of malignant neoplasm of ovary: Secondary | ICD-10-CM | POA: Diagnosis not present

## 2019-07-24 DIAGNOSIS — Z6841 Body Mass Index (BMI) 40.0 and over, adult: Secondary | ICD-10-CM | POA: Diagnosis not present

## 2019-07-31 DIAGNOSIS — Z1231 Encounter for screening mammogram for malignant neoplasm of breast: Secondary | ICD-10-CM | POA: Diagnosis not present

## 2019-08-14 DIAGNOSIS — H2513 Age-related nuclear cataract, bilateral: Secondary | ICD-10-CM | POA: Diagnosis not present

## 2019-08-14 DIAGNOSIS — H04123 Dry eye syndrome of bilateral lacrimal glands: Secondary | ICD-10-CM | POA: Diagnosis not present

## 2019-09-11 DIAGNOSIS — M71571 Other bursitis, not elsewhere classified, right ankle and foot: Secondary | ICD-10-CM | POA: Diagnosis not present

## 2019-09-11 DIAGNOSIS — M2041 Other hammer toe(s) (acquired), right foot: Secondary | ICD-10-CM | POA: Diagnosis not present

## 2019-09-17 DIAGNOSIS — I10 Essential (primary) hypertension: Secondary | ICD-10-CM | POA: Diagnosis not present

## 2019-09-17 DIAGNOSIS — E039 Hypothyroidism, unspecified: Secondary | ICD-10-CM | POA: Diagnosis not present

## 2019-09-17 DIAGNOSIS — Z0000 Encounter for general adult medical examination without abnormal findings: Secondary | ICD-10-CM | POA: Diagnosis not present

## 2019-09-22 DIAGNOSIS — Z23 Encounter for immunization: Secondary | ICD-10-CM | POA: Diagnosis not present

## 2019-09-22 DIAGNOSIS — Z0000 Encounter for general adult medical examination without abnormal findings: Secondary | ICD-10-CM | POA: Diagnosis not present

## 2019-09-24 DIAGNOSIS — Z23 Encounter for immunization: Secondary | ICD-10-CM | POA: Diagnosis not present

## 2019-09-29 ENCOUNTER — Other Ambulatory Visit: Payer: Self-pay | Admitting: Internal Medicine

## 2019-09-29 DIAGNOSIS — E049 Nontoxic goiter, unspecified: Secondary | ICD-10-CM

## 2019-10-01 DIAGNOSIS — Z7189 Other specified counseling: Secondary | ICD-10-CM | POA: Diagnosis not present

## 2019-10-01 DIAGNOSIS — Z713 Dietary counseling and surveillance: Secondary | ICD-10-CM | POA: Diagnosis not present

## 2019-10-01 DIAGNOSIS — R7301 Impaired fasting glucose: Secondary | ICD-10-CM | POA: Diagnosis not present

## 2019-10-05 DIAGNOSIS — L600 Ingrowing nail: Secondary | ICD-10-CM | POA: Diagnosis not present

## 2019-10-07 ENCOUNTER — Ambulatory Visit
Admission: RE | Admit: 2019-10-07 | Discharge: 2019-10-07 | Disposition: A | Discharge status: Home / Self Care | Payer: BLUE CROSS/BLUE SHIELD | Source: Ambulatory Visit | Attending: Internal Medicine | Admitting: Internal Medicine

## 2019-10-07 DIAGNOSIS — E049 Nontoxic goiter, unspecified: Secondary | ICD-10-CM

## 2019-10-07 DIAGNOSIS — E041 Nontoxic single thyroid nodule: Secondary | ICD-10-CM | POA: Diagnosis not present

## 2019-10-15 DIAGNOSIS — R0989 Other specified symptoms and signs involving the circulatory and respiratory systems: Secondary | ICD-10-CM | POA: Diagnosis not present

## 2019-10-15 DIAGNOSIS — I739 Peripheral vascular disease, unspecified: Secondary | ICD-10-CM | POA: Diagnosis not present

## 2019-10-16 ENCOUNTER — Other Ambulatory Visit: Payer: Self-pay | Admitting: Internal Medicine

## 2019-11-09 ENCOUNTER — Ambulatory Visit: Payer: BLUE CROSS/BLUE SHIELD | Admitting: Neurology

## 2019-11-16 DIAGNOSIS — F419 Anxiety disorder, unspecified: Secondary | ICD-10-CM | POA: Diagnosis not present

## 2019-11-25 DIAGNOSIS — Z713 Dietary counseling and surveillance: Secondary | ICD-10-CM | POA: Diagnosis not present

## 2019-11-25 DIAGNOSIS — Z6841 Body Mass Index (BMI) 40.0 and over, adult: Secondary | ICD-10-CM | POA: Diagnosis not present

## 2019-11-25 DIAGNOSIS — E041 Nontoxic single thyroid nodule: Secondary | ICD-10-CM | POA: Diagnosis not present

## 2019-11-25 DIAGNOSIS — Z23 Encounter for immunization: Secondary | ICD-10-CM | POA: Diagnosis not present

## 2019-11-25 DIAGNOSIS — R7301 Impaired fasting glucose: Secondary | ICD-10-CM | POA: Diagnosis not present

## 2019-12-08 DIAGNOSIS — B373 Candidiasis of vulva and vagina: Secondary | ICD-10-CM | POA: Diagnosis not present

## 2019-12-28 DIAGNOSIS — L292 Pruritus vulvae: Secondary | ICD-10-CM | POA: Diagnosis not present

## 2020-01-04 ENCOUNTER — Encounter: Payer: Self-pay | Admitting: Neurology

## 2020-01-04 ENCOUNTER — Other Ambulatory Visit: Payer: Self-pay

## 2020-01-04 ENCOUNTER — Ambulatory Visit: Payer: BC Managed Care – PPO | Admitting: Neurology

## 2020-01-04 VITALS — BP 134/72 | HR 70 | Temp 97.0°F | Ht 64.5 in | Wt 254.2 lb

## 2020-01-04 DIAGNOSIS — R519 Headache, unspecified: Secondary | ICD-10-CM

## 2020-01-04 DIAGNOSIS — G4733 Obstructive sleep apnea (adult) (pediatric): Secondary | ICD-10-CM | POA: Diagnosis not present

## 2020-01-04 MED ORDER — RIZATRIPTAN BENZOATE 10 MG PO TBDP: 10.0000 mg | ORAL_TABLET | ORAL | 11 refills | Status: DC | PRN

## 2020-01-04 NOTE — Progress Notes (Signed)
PATIENT: Carolyn Mcguire DOB: Oct 20, 1956  REASON FOR VISIT: follow up HISTORY FROM: patient  HISTORY OF PRESENT ILLNESS: Today 01/04/20  HISTORY HISTORY 02/27/17 Carolyn Mcguire a 64 years old right-handed female, seen in refer by her primary care doctor Lisabeth Devoid evaluation of headache, initial evaluation was on February 27 2017. She has gone through stressful few years, multiple close family member passed away in a short period of time, she had depression anxiety, around 2016, she began to have frequent headaches, vortex, occipital region pressure headaches, at the same time she had constant daily pulsatile tinnitus, Her headache has become daily, today she reported 6/10 headache,, she is only taking Tylenol as needed, she also complains of depression anxiety, Triggerfor her headaches areexercise, stress, weather change, sleep deprivation, hot, noise, smell, perfume, smoke Over the years, she was seen by ENT, and other specialists  I reviewed the records she brought from ENT [name], August 01 2016, audiogramin October 2013 was unremarkable, discrimination score was 96%,  MRI of the brain on Apr 25 2016 from [facility], unremarkable MRI of the brain and temporal bone,  [facility] orthopedic evaluation on December 07 2016, complained of neck pain, x-ray showed mild degenerative changes and facet disease at multiple levels, physical therapy was ordered, MRI of cervical spine was ordered, I also reviewed the record from [facility] tinnitus evaluation, findings indicate hearing within normal limits in both ear, with a steeply sloping hearing loss in the extended high frequency only, 12.5-16 K hertz, speech recognition score was excellent, notably like stress and the psychological condition such as anxiety or depression cane exacerbate hersymptoms, interfere with treatment, in her case, stress likely is theaggravating factors that contributes to her tinnitus  disturbance I reviewed and summarized referring note, she had a history of hypertension, hyperlipidemia, had a history of right hemithyroidectomy, for benign thyroid nodule, Laboratory evaluations in March 2018, normal BMP, CBC, TSH,  I personally reviewed cervical x-ray, mild degenerative changes UPDATE 07/13/2018CMMs [name], 64 year old female returns for follow-up. She has a history of daily headaches tension type with some migraine features. Due to the significant stressors in her life she was placed on Effexor by Dr. [name], however she never got the prescription filled and does not want to be on antidepressant. She had multiple family members pass away in short period of time. Today she also complains of daytime drowsiness she is morbidly obese and snores. She has a history of hypertension and hyperlipidemia. MRI of the brain in 2017 was normal. She returns for reevaluation. 10/15/2018CMMs. [name], 64 year old female returns for follow-up with a history of daily headaches tension type with migraine features. She has a lot of stress in her life but does not want to be on an antidepressant. When last seen she was given Imitrex to try acutely for her migraines however she felt it made her feel worse. She has been diagnosed with mild sleep apnea and instead of using CPAP she opted for a dental appliance. This referral was sent to her dentist but she has not heard anything. She has a history of hypertension and hyperlipidemia. She is aware that both of these conditions would benefit from her losing weight and exercising. She returns for reevaluation UPDATE3/20/19 CMMs.[name], 64 year old female returns for follow-up with history of daily headaches tension type with migraine features. We reviewed the records from [facility] tinnitus evaluationfindings indicate hearing within normal limits in both ear, with a steeply sloping hearing loss in the extended high frequency only, 12.5-16  K hertz, speech recognition  score was excellent, notably like stress and the psychological condition such as anxiety or depression cane exacerbate hersymptoms, interfere with treatment, in her case, stress likely is theaggravating factors that contributes to her tinnitus disturbance. She feels Maxalt works better for her headaches and Imitrex and she uses it 2-4 times a month. Many times when she gets a headache she just uses an ice pack for about 10 minutes and it goes away. She remains on Toprol 25 daily. Sometimes her headaches start in the back of the neck move up. She had gotten a prescription for some physical therapy but could not afford to pay the co-pay. Sleep study positive for mild obstructive sleep apnea however she could not afford the dental appliance to treat this. She does say she walks every day for exercise she remains overweight she returns for reevaluation. 12/11/2019CMMs.[name],64 year old female returns for follow-up with history of tension type headaches with migraine features.Some of her headaches are related to stress and others are related to her tinnitus. She does not want to be on any antidepressant. She takes Maxalt acutely. Since last seen she had lost some weight and felt much better however she has gained that back. She was encouraged to try weight watchers. She also has a history of mild obstructive sleep apnea however her insurance would not pay for dental appliance. She continues to walk every day for exercise. Odors are a migraine trigger for her. She also has intermittent neck pain that causes headache. She has a history of hypertension and hyperlipidemia and weight loss would be beneficial for both of these disease processes. She returns for reevaluation Update June 28, 5497 SS: 64 year old with history of tension type headaches and migraine features.  She does not want to take an antidepressant, she remains on Maxalt as needed.  She has mild OSA, was unable to afford a dental  appliance. Today, she thinks her headaches may be worse. She has daily headache, pounding, occasional photophobia.  She says she wakes up with a headache in the morning.  At night the pain is worse. She is able to go about her daily activities.  She says the pain is located to her entire head.  She does have history of tinnitus.  Her ENT doctor recently retired.  She says since last visit she has gained 20 pounds. She is not very active.  Update January 04, 2020 SS: Ms Foister is a 64 year old female with a history of tension type headaches and migraine features.  She continues to report daily headache in the morning, is dull, can be associated with migraine features of photophobia, phonophobia, nausea but does not require medication intervention.  She describes a more intense headache, at will occur in the evenings, that she will take Maxalt, that will take the edge off, maybe 1-2 times a week.  She continues to have fluttering in her ears, has been chronic, her ENT retired.  She did not pursue the dental device, due to the expense.  In 2018, a sleep study revealed mild OSA, weight loss and CPAP was recommended.  She has not been able to lose any weight.  She presents today, complaining of persistent headache.  She has been resistant to trying medications previously. She denies any new symptoms. She presents today for evaluation unaccompanied.  REVIEW OF SYSTEMS: Out of a complete 14 system review of symptoms, the patient complains only of the following symptoms, and all other reviewed systems are negative.  Headache  ALLERGIES: Allergies  Allergen Reactions  . Sulfur Rash    MAKES PT RED    HOME MEDICATIONS: Outpatient Medications Prior to Visit  Medication Sig Dispense Refill  . Cholecalciferol (VITAMIN D3) 1000 units CAPS Take by mouth. One daily    . ciclopirox (PENLAC) 8 % solution APPLY TOPICALLY TO AFFECTED TOE NAILS D    . hydrochlorothiazide (MICROZIDE) 12.5 MG capsule Take 1 capsule  (12.5 mg total) by mouth every other day. 90 capsule 3  . hydrocortisone (ANUSOL-HC) 25 MG suppository Place 1 suppository (25 mg total) rectally at bedtime as needed for hemorrhoids or anal itching. 6 suppository 3  . levothyroxine (SYNTHROID, LEVOTHROID) 88 MCG tablet Take 88 mcg by mouth daily before breakfast.    . losartan (COZAAR) 100 MG tablet Take 100 mg by mouth daily. as directed  3  . metoprolol succinate (TOPROL-XL) 25 MG 24 hr tablet TAKE 1 TABLET BY MOUTH DAILY 90 tablet 3  . rizatriptan (MAXALT-MLT) 10 MG disintegrating tablet Take 1 tablet (10 mg total) by mouth as needed for migraine. May repeat in 2 hours if needed 10 tablet 11  . rosuvastatin (CRESTOR) 10 MG tablet Take 10 mg by mouth daily.     No facility-administered medications prior to visit.    PAST MEDICAL HISTORY: Past Medical History:  Diagnosis Date  . Adenomatous colon polyp   . Allergic rhinitis   . Animal bite 05/2015   fox   . Anxiety   . Arthritis    shoulder  . Carpal tunnel syndrome    RIGHT HAND  . Depression   . Diastolic dysfunction    a. 2D echo 09/08/15: EF 55-60%, grade 1 DD, elevated LVEDP, mild MR, mild TR. - normal BNP 08/2015.  Marland Kitchen Essential hypertension   . GERD (gastroesophageal reflux disease)   . Headache   . Heart murmur   . Hepatic steatosis   . Hyperlipidemia   . Hypothyroidism   . LBBB (left bundle branch block)    a. negative nuclear stress test 06/2011.  . Migraines   . Obesity   . OSA (obstructive sleep apnea)    mild    PAST SURGICAL HISTORY: Past Surgical History:  Procedure Laterality Date  . DERMOID CYST  EXCISION     OF HER RIGHT OVARY. SIZE OF A GRAPEFRUIT. OVARY IS STILL THERE.  Marland Kitchen HEMITHYROIDECTOMY    . RUPTURE BELLY BUTTON     WHEN SHE WAS AN INFANT  . TONSILECTOMY, ADENOIDECTOMY, BILATERAL MYRINGOTOMY AND TUBES     AGE 14, pt states she didn't have tubes put into her ears    FAMILY HISTORY: Family History  Problem Relation Age of Onset  . Ovarian  cancer Mother        dx. 24-59; TAH-BSO at 10  . Diabetes Mother   . Pancreatic cancer Mother 53       d. 27  . Skin cancer Mother        maybe basal cell carcinoma  . Obesity Mother   . Heart disease Father   . Bladder Cancer Father        dx 34-81  . Skin cancer Father        possibly melanoma; +sun exposure  . Cancer Brother 61       salivary gland cancer; d. 100; no tobacco use  . Colon cancer Maternal Uncle        dx late 32s; d. early 71s  . Ovarian cancer Maternal Grandmother  d. 77; w/ mets  . Breast cancer Maternal Aunt        dx unspecified age; s/p mastectomy  . Other Maternal Aunt        intellectual disabilities  . Cancer Paternal Uncle        CLL, dx. 40  . Heart attack Maternal Grandfather        d. 40  . Heart attack Maternal Uncle 51  . Heart failure Maternal Aunt 86       acute diastolic heart failure  . Esophageal cancer Neg Hx   . Rectal cancer Neg Hx   . Stomach cancer Neg Hx     SOCIAL HISTORY: Social History   Socioeconomic History  . Marital status: Single    Spouse name: Not on file  . Number of children: 0  . Years of education: HS  . Highest education level: Not on file  Occupational History  . Occupation: Unemployed  Tobacco Use  . Smoking status: Never Smoker  . Smokeless tobacco: Never Used  Substance and Sexual Activity  . Alcohol use: No  . Drug use: No  . Sexual activity: Not Currently  Other Topics Concern  . Not on file  Social History Narrative   Lives at home alone.   Right-handed.   She does not drink caffeine.  She will occasionally eat chocolate.   Social Determinants of Health   Financial Resource Strain:   . Difficulty of Paying Living Expenses: Not on file  Food Insecurity:   . Worried About Charity fundraiser in the Last Year: Not on file  . Ran Out of Food in the Last Year: Not on file  Transportation Needs:   . Lack of Transportation (Medical): Not on file  . Lack of Transportation (Non-Medical):  Not on file  Physical Activity:   . Days of Exercise per Week: Not on file  . Minutes of Exercise per Session: Not on file  Stress:   . Feeling of Stress : Not on file  Social Connections:   . Frequency of Communication with Friends and Family: Not on file  . Frequency of Social Gatherings with Friends and Family: Not on file  . Attends Religious Services: Not on file  . Active Member of Clubs or Organizations: Not on file  . Attends Archivist Meetings: Not on file  . Marital Status: Not on file  Intimate Partner Violence:   . Fear of Current or Ex-Partner: Not on file  . Emotionally Abused: Not on file  . Physically Abused: Not on file  . Sexually Abused: Not on file   PHYSICAL EXAM  Vitals:   01/04/20 1027  BP: 134/72  Pulse: 70  Temp: (!) 97 F (36.1 C)  TempSrc: Oral  Weight: 254 lb 3.2 oz (115.3 kg)  Height: 5' 4.5" (1.638 m)   Body mass index is 42.96 kg/m.  Generalized: Well developed, in no acute distress   Neurological examination  Mentation: Alert oriented to time, place, history taking. Follows all commands speech and language fluent Cranial nerve II-XII: Pupils were equal round reactive to light. Extraocular movements were full, visual field were full on confrontational test. Facial sensation and strength were normal.  Head turning and shoulder shrug  were normal and symmetric. Motor: The motor testing reveals 5 over 5 strength of all 4 extremities. Good symmetric motor tone is noted throughout.  Sensory: Sensory testing is intact to soft touch on all 4 extremities. No evidence of extinction is noted.  Coordination: Cerebellar testing reveals good finger-nose-finger and heel-to-shin bilaterally.  Gait and station: Gait is normal. Tandem gait is normal. Romberg is negative. No drift is seen.  Reflexes: Deep tendon reflexes are symmetric and normal bilaterally.   DIAGNOSTIC DATA (LABS, IMAGING, TESTING) - I reviewed patient records, labs, notes,  testing and imaging myself where available.  Lab Results  Component Value Date   WBC 8.5 09/16/2015   HGB 14.4 09/16/2015   HCT 40.1 09/16/2015   MCV 83.9 09/16/2015   PLT 213 09/16/2015      Component Value Date/Time   NA 142 06/02/2018 1114   K 4.0 06/02/2018 1114   CL 102 06/02/2018 1114   CO2 24 06/02/2018 1114   GLUCOSE 109 (H) 06/02/2018 1114   GLUCOSE 89 08/02/2016 1127   BUN 18 06/02/2018 1114   CREATININE 0.92 06/02/2018 1114   CREATININE 0.84 08/02/2016 1127   CALCIUM 9.2 06/02/2018 1114   PROT 7.2 09/16/2015 1026   ALBUMIN 4.4 09/16/2015 1026   AST 18 09/16/2015 1026   ALT 15 09/16/2015 1026   ALKPHOS 85 09/16/2015 1026   BILITOT 0.9 09/16/2015 1026   GFRNONAA 68 06/02/2018 1114   GFRAA 79 06/02/2018 1114   No results found for: CHOL, HDL, LDLCALC, LDLDIRECT, TRIG, CHOLHDL No results found for: HGBA1C No results found for: VITAMINB12 Lab Results  Component Value Date   TSH 1.267 09/16/2015     ASSESSMENT AND PLAN 64 y.o. year old female  has a past medical history of Adenomatous colon polyp, Allergic rhinitis, Animal bite (05/2015), Anxiety, Arthritis, Carpal tunnel syndrome, Depression, Diastolic dysfunction, Essential hypertension, GERD (gastroesophageal reflux disease), Headache, Heart murmur, Hepatic steatosis, Hyperlipidemia, Hypothyroidism, LBBB (left bundle branch block), Migraines, Obesity, and OSA (obstructive sleep apnea). here with:  1.  Chronic headaches with some migrainous features 2.  Mild obstructive sleep apnea in 2018, continue morning headache -MRI of the brain May 2017 at [facility] was unremarkable -She has tried Effexor in the past, is on Toprol now, she has not wanted to try another antidepressant -I offered her Topamax, could also be benefit for weight loss, she does not wish to try medication at this time, wants to consider CPAP -She will continue taking Maxalt as needed for acute headache, at most taking 1-2 times a week -I will refer  her for sleep evaluation, she is considering CPAP, she could not afford the dental device in 2018, her last sleep study was in 2018, showed mild OSA, recommended weight loss and CPAP -She will follow-up in 6 months for recheck on her headaches, hopefully will improve with CPAP treatment, but she does have migraine features   I spent 25 minutes with the patient. 50% of this time was spent discussing her plan of care.   Butler Denmark, AGNP-C, DNP 01/04/2020, 10:45 AM [facility] [address] [place], [place] [address] [phone]

## 2020-01-04 NOTE — Patient Instructions (Signed)
I will send a referral for sleep evaluation. See you back in 6 months

## 2020-01-20 ENCOUNTER — Encounter: Payer: Self-pay | Admitting: Cardiovascular Disease

## 2020-01-20 ENCOUNTER — Telehealth: Payer: Self-pay | Admitting: Radiology

## 2020-01-20 ENCOUNTER — Telehealth: Payer: Self-pay | Admitting: Cardiovascular Disease

## 2020-01-20 ENCOUNTER — Other Ambulatory Visit: Payer: Self-pay

## 2020-01-20 ENCOUNTER — Ambulatory Visit: Payer: BC Managed Care – PPO | Admitting: Cardiovascular Disease

## 2020-01-20 VITALS — BP 174/96 | HR 75 | Ht 64.5 in | Wt 249.0 lb

## 2020-01-20 DIAGNOSIS — E785 Hyperlipidemia, unspecified: Secondary | ICD-10-CM | POA: Diagnosis not present

## 2020-01-20 DIAGNOSIS — I447 Left bundle-branch block, unspecified: Secondary | ICD-10-CM

## 2020-01-20 DIAGNOSIS — R0600 Dyspnea, unspecified: Secondary | ICD-10-CM | POA: Diagnosis not present

## 2020-01-20 DIAGNOSIS — I10 Essential (primary) hypertension: Secondary | ICD-10-CM

## 2020-01-20 DIAGNOSIS — E669 Obesity, unspecified: Secondary | ICD-10-CM

## 2020-01-20 DIAGNOSIS — R002 Palpitations: Secondary | ICD-10-CM

## 2020-01-20 MED ORDER — POTASSIUM CHLORIDE ER 10 MEQ PO TBCR: 10.0000 meq | EXTENDED_RELEASE_TABLET | Freq: Two times a day (BID) | ORAL | 3 refills | Status: DC

## 2020-01-20 MED ORDER — CHLORTHALIDONE 25 MG PO TABS: 12.5000 mg | ORAL_TABLET | Freq: Every day | ORAL | 3 refills | Status: DC

## 2020-01-20 MED ORDER — CHLORTHALIDONE 25 MG PO TABS: 25.0000 mg | ORAL_TABLET | Freq: Every day | ORAL | 3 refills | Status: DC

## 2020-01-20 MED ORDER — METOPROLOL SUCCINATE ER 50 MG PO TB24: 50.0000 mg | ORAL_TABLET | Freq: Every day | ORAL | 3 refills | Status: DC

## 2020-01-20 NOTE — Progress Notes (Signed)
Carolyn Mcguire Date of Birth  [birth year] [facility] [id number] [address]    [place], [place]  [address] [phone]  Fax  [phone]  Problem list: 1. Left bundle branch block 2. Hypothyroidism 3. Hyperlipidemia 4. Hypertension 5. Dyspnea on exertion      64 yo female with a hx of LBBB.  Stress myoview was negative.  She's been on a diet and exercise program and has lost about 6 pounds since last year.  She is drinking more water, avoiding fast foods.  She is walking regularly.  She has been diagnosed with migraines.  She thinks muscle tension may be contributing to her headaches.  Oct. 5, 2016:  Carolyn Mcguire is seen today for follow up of her shortness of breath Has had many deaths in her family and became depressed . Was started on Welbutrin for depression .  She was seen in the ER. Was told by the pharmacist at her primary medical doctors office thought it was due to the discontinuation of her Welbutrin. She is still not feeling 100% better so she was scheduled to see me .  She is still short of breath.    DOE  With walking up from the buildings in the back of her property.  She does not getting any regular exercise. She was bitten by a fox on July 11.  Had a series of rabies shots ( the fox was rabid)  Is retired .   She has had a stressful 2 years.    Several deaths in her family . Has not had the time or inclination  Wants to start a walking program.     No CP   February 21, 2016: Doing well Has lost some weight .   Has lost 9 lbs since I last saw her  Walks regularly   Sept. 7, 2017: Doing well BP is doing well at home. Still walking some  Has been to a nutritionist.   March 2 , 2108: Carolyn Mcguire is seen today .  Follow up for hypertension and hyper-hyperlipidemia, and hypothyroidism. BP is well controlled.   February 28, 2018: Carolyn Mcguire is seen today for follow-up of her hypertension and hyperlipidemia.  She has a left bundle branch block. BP has been ok.   A bit  hight today  Wt = 250  ( up 7 lbs from last year )  Has been eating out lots  .     January 06, 2019: Carolyn is seen today for follow-up of her hypertension and hyperlipidemia.  She has a left bundle branch block that is old.  Weight today is 246 pounds which is down 3 pounds from her last visit.  BP is well controlled Has DOE from her weight  January 20, 2020:  Carolyn Mcguire is seen back today for history of hypertension and hyperlipidemia.  She has a left bundle branch block.  Weight today is 249 pounds which is up 3 pounds of her last visit. Had irregular episode of tachycardia yesterday  Lasted several hours Had 2 separate episodes  -she took her blood pressure which was elevated.  She found that her heart rate was irregular in the 120s.  Her heart rate monitor indicated that her heart rate was irregular. Has not been watching her diet.   Still eating salty foods most meals    Current Outpatient Medications on File Prior to Visit  Medication Sig Dispense Refill  . Cholecalciferol (VITAMIN D3) 1000 units CAPS Take by mouth. One daily    .  ciclopirox (PENLAC) 8 % solution APPLY TOPICALLY TO AFFECTED TOE NAILS D    . hydrochlorothiazide (MICROZIDE) 12.5 MG capsule Take 1 capsule (12.5 mg total) by mouth every other day. 90 capsule 3  . hydrocortisone (ANUSOL-HC) 25 MG suppository Place 1 suppository (25 mg total) rectally at bedtime as needed for hemorrhoids or anal itching. 6 suppository 3  . levothyroxine (SYNTHROID, LEVOTHROID) 88 MCG tablet Take 88 mcg by mouth daily before breakfast.    . losartan (COZAAR) 100 MG tablet Take 100 mg by mouth daily. as directed  3  . metoprolol succinate (TOPROL-XL) 25 MG 24 hr tablet TAKE 1 TABLET BY MOUTH DAILY 90 tablet 3  . rizatriptan (MAXALT-MLT) 10 MG disintegrating tablet Take 1 tablet (10 mg total) by mouth as needed for migraine. May repeat in 2 hours if needed 10 tablet 11  . rosuvastatin (CRESTOR) 10 MG tablet Take 10 mg by mouth daily.      No current facility-administered medications on file prior to visit.    Allergies  Allergen Reactions  . Sulfur Rash    MAKES PT RED    Past Medical History:  Diagnosis Date  . Adenomatous colon polyp   . Allergic rhinitis   . Animal bite 05/2015   fox   . Anxiety   . Arthritis    shoulder  . Carpal tunnel syndrome    RIGHT HAND  . Depression   . Diastolic dysfunction    a. 2D echo 09/08/15: EF 55-60%, grade 1 DD, elevated LVEDP, mild MR, mild TR. - normal BNP 08/2015.  Marland Kitchen Essential hypertension   . GERD (gastroesophageal reflux disease)   . Headache   . Heart murmur   . Hepatic steatosis   . Hyperlipidemia   . Hypothyroidism   . LBBB (left bundle branch block)    a. negative nuclear stress test 06/2011.  . Migraines   . Obesity   . OSA (obstructive sleep apnea)    mild    Past Surgical History:  Procedure Laterality Date  . DERMOID CYST  EXCISION     OF HER RIGHT OVARY. SIZE OF A GRAPEFRUIT. OVARY IS STILL THERE.  Marland Kitchen HEMITHYROIDECTOMY    . RUPTURE BELLY BUTTON     WHEN SHE WAS AN INFANT  . TONSILECTOMY, ADENOIDECTOMY, BILATERAL MYRINGOTOMY AND TUBES     AGE 76, pt states she didn't have tubes put into her ears    Social History   Tobacco Use  Smoking Status Never Smoker  Smokeless Tobacco Never Used    Social History   Substance and Sexual Activity  Alcohol Use No    Family History  Problem Relation Age of Onset  . Ovarian cancer Mother        dx. 38-59; TAH-BSO at 29  . Diabetes Mother   . Pancreatic cancer Mother 67       d. 58  . Skin cancer Mother        maybe basal cell carcinoma  . Obesity Mother   . Heart disease Father   . Bladder Cancer Father        dx 30-81  . Skin cancer Father        possibly melanoma; +sun exposure  . Cancer Brother 4       salivary gland cancer; d. 25; no tobacco use  . Colon cancer Maternal Uncle        dx late 75s; d. early 47s  . Ovarian cancer Maternal Grandmother  d. 93; w/ mets  . Breast  cancer Maternal Aunt        dx unspecified age; s/p mastectomy  . Other Maternal Aunt        intellectual disabilities  . Cancer Paternal Uncle        CLL, dx. 12  . Heart attack Maternal Grandfather        d. 88  . Heart attack Maternal Uncle 51  . Heart failure Maternal Aunt 86       acute diastolic heart failure  . Esophageal cancer Neg Hx   . Rectal cancer Neg Hx   . Stomach cancer Neg Hx     Reviw of Systems:  Noted in current history, all other systems are negative.Marland Kitchen  Physical Exam: Blood pressure (!) 174/96, pulse 75, height 5' 4.5" (1.638 m), weight 249 lb (112.9 kg), SpO2 99 %.  GEN: Morbidly obese middle-aged female, no acute distress. HEENT: Normal NECK: No JVD; No carotid bruits LYMPHATICS: No lymphadenopathy CARDIAC: RRR , no murmurs, rubs, gallops RESPIRATORY:  Clear to auscultation without rales, wheezing or rhonchi  ABDOMEN: Soft, non-tender, non-distended MUSCULOSKELETAL:  No edema; No deformity  SKIN: Warm and dry NEUROLOGIC:  Alert and oriented x 3     ECG: January 20, 2020: Normal sinus rhythm at 77.  Left bundle branch block.  Assessment / Plan:   1. Left bundle branch block-  Stable    2. Hypothyroidism-     managed by her primary doctor.  3. Hyperlipidemia - -stable.  4. Hypertension -blood pressures chronic.  She is been careless with her diet and continues to gain weight.  She has been eating lots of salty foods.  Advised her to greatly reduce her intake of salt as well as carbohydrates and fatty foods.  I encouraged her to exercise on a regular basis.  We will stop the hydrochlorothiazide.  We will start her on chlorthalidone 12.5 mg a day, potassium chloride 10 mEq twice a day and will increase her metoprolol to 50 mg twice a day.  5.  Palpitations: She had an episode of palpitations yesterday that sounds consistent with atrial fibrillation.  She found her heart rate to be in the 120s and her blood pressure cuff said that her heart rate was  irregular.  We will place a 30-day monitor on her for further evaluation.    5. Dyspnea on exertion-  Likely related to her weight  Continue weight loss efforts.   7.  Morbid obesity:   Advised weight loss  Body mass index is 42.08 kg/m.     Mertie Moores, MD  01/20/2020 11:38 AM    [place],  [place],   [address] Pager [phone] Phone: [phone]; Fax: [phone]

## 2020-01-20 NOTE — Telephone Encounter (Signed)
Spoke with Pt.  Her blood pressure right now is 164/87 and pulse 83.  She denies any active chest pain.  Advised Pt should keep her appt with Dr. Acie Fredrickson at 11:00 am and if he felt she should go to the hospital he would advise at that time.  Pt indicates understanding.

## 2020-01-20 NOTE — Patient Instructions (Signed)
Medication Instructions:  STOP  HYDROCHLOROTHIAZIDE START CHLORTHALIDONE 12.5 MG EVERY DAY KDUR 10 MEQ 1 TAB TWICE DAILY INCREASE METOPROLOL TO 50 MG EVERY DAY  *If you need a refill on your cardiac medications before your next appointment, please call your pharmacy*  Lab Work: 1 MONTH BMET  If you have labs (blood work) drawn today and your tests are completely normal, you will receive your results only by: Marland Kitchen MyChart Message (if you have MyChart) OR . A paper copy in the mail If you have any lab test that is abnormal or we need to change your treatment, we will call you to review the results.  Testing/Procedures: Your physician has requested that you have an echocardiogram. Echocardiography is a painless test that uses sound waves to create images of your heart. It provides your doctor with information about the size and shape of your heart and how well your heart's chambers and valves are working. This procedure takes approximately one hour. There are no restrictions for this procedure.    Your physician has recommended that you wear an event monitor. Event monitors are medical devices that record the heart's electrical activity. Doctors most often [place] these monitors to diagnose arrhythmias. Arrhythmias are problems with the speed or rhythm of the heartbeat. The monitor is a small, portable device. You can wear one while you do your normal daily activities. This is usually used to diagnose what is causing palpitations/syncope (passing out). 73 DAY      Follow-Up: At [facility], you and your health needs are our priority.  As part of our continuing mission to provide you with exceptional heart care, we have created designated Provider Care Teams.  These Care Teams include your primary Cardiologist (physician) and Advanced Practice Providers (APPs -  Physician Assistants and Nurse Practitioners) who all work together to provide you with the care you need, when you need it.  Your next  appointment:   1 MONTH  NURSE VISIT B/P CHECK   3 MONTHS WITH APP

## 2020-01-20 NOTE — Telephone Encounter (Signed)
Pt c/o BP issue: STAT if pt c/o blurred vision, one-sided weakness or slurred speech  1. What are your last 5 BP readings?  7:45 AM 175/92 HR 101 12:00-1:00 174/93 HR 124  2. Are you having any other symptoms (ex. Dizziness, headache, blurred vision, passed out)? Nausea, Frequent Urination   3. What is your BP issue? Patient is calling stating she is having issues with her BP. She states she checked it between 12:00 AM - 1:00 AM last night and it was 174/93 HR 124. She states no symptoms were present besides frequent urination. When she checked her  BP this morning it was 175/92 HR 101. She states she is feeling better than last night now and the only symptoms occurring is nausea. Carolyn Mcguire has an appointment scheduled for today at 11:00 AM with Dr. Acie Fredrickson but feels she may need to go to the hospital. Please advise.

## 2020-01-20 NOTE — Telephone Encounter (Signed)
Enrolled patient for a 30 day [employer] Event monitor to be mailed to patients home.  

## 2020-01-22 ENCOUNTER — Telehealth: Payer: Self-pay | Admitting: Cardiovascular Disease

## 2020-01-22 NOTE — Telephone Encounter (Addendum)
I spoke with patient. She reports BP of 122/86 this AM around 11:00. She took clorthalidone at that time. She spaces out her medications during the day. She took BP again today and last reading was 118/78 and heart rate 95.  She is concerned her BP may become too low.  She felt a little wobbly earlier this morning but is fine now.  Her weak feeling has not increased since she saw Dr Acie Fredrickson earlier this week.  She has stopped eating out.  I assured patient her BP at this time was OK.  I told her it would take some time to see effects of diet change and medication change.  I asked her to check BP daily about 2 hours after taking last morning medication.  She will bring these readings to BP check in one month.

## 2020-01-22 NOTE — Telephone Encounter (Signed)
  Pt c/o BP issue: STAT if pt c/o blurred vision, one-sided weakness or slurred speech  1. What are your last 5 BP readings? 122/86  2. Are you having any other symptoms (ex. Dizziness, headache, blurred vision, passed out)? Dizzy, weak, and no energy   3. What is your BP issue? States her BP medication were recently changes at her last visit. She is feeling a little dizzy, and feeling a little weak, and no energy. She is wondering if maybe her medication needs to be adjusted again. chlorthalidone (HYGROTON) 25 MG tablet metoprolol succinate (TOPROL-XL) 50 MG 24 hr tablet potassium chloride (KLOR-CON) 10 MEQ tablet

## 2020-01-24 NOTE — Telephone Encounter (Signed)
Agree with note by Enis Slipper  She should continue to watch her diet and take the meds.   If her systolic BP is consistently below 100 , she should decrease the chlorthaladone to 1/2 tablet .

## 2020-01-25 NOTE — Telephone Encounter (Signed)
I spoke with patient and gave her instructions from Dr Acie Fredrickson regarding chlorthalidone.

## 2020-01-28 ENCOUNTER — Telehealth: Payer: Self-pay | Admitting: Neurology

## 2020-01-28 DIAGNOSIS — R519 Headache, unspecified: Secondary | ICD-10-CM

## 2020-01-28 DIAGNOSIS — G8929 Other chronic pain: Secondary | ICD-10-CM

## 2020-01-28 NOTE — Telephone Encounter (Signed)
If she feels she has a different headache from the typical migraine headache, I will order MRI of the brain.   Of note, MRI of the brain in 2017 was normal at [facility]. Of note, she has been resistant to medication therapy for migraines. Make sure no metal, order placed.

## 2020-01-28 NOTE — Telephone Encounter (Signed)
Pt called stating she doesn't want to start a medication when she is having sharp head pain and is unsure if it is a migraine. She would like to get a CT scan. And Is unsure what the workflow would be in order to get it done. She states her balance is also off.

## 2020-01-28 NOTE — Telephone Encounter (Signed)
I called pt and relayed that order for MRI placed.  She has not metal.  She appreciated this.

## 2020-01-28 NOTE — Telephone Encounter (Signed)
Pt called pt and she wants imaging related to her feeling of sharp pains in her head.  She does not feel that they are migraine related.  Taking rizatriptan but she states has not worked that [place] since beginning her her taking it.  She states she had these feelings that are different then migraine.  Pounding in her head/ has tinnitis.

## 2020-01-29 ENCOUNTER — Telehealth: Payer: Self-pay | Admitting: Cardiovascular Disease

## 2020-01-29 NOTE — Telephone Encounter (Signed)
Pt c/o BP issue: STAT if pt c/o blurred vision, one-sided weakness or slurred speech  1. What are your last 5 BP readings?  03-05: 94/64 11:44 am 03-04: 96/63 4:30 pm 03-04: 111/67 2:07 pm 03-04: 88/70 (pt does not have exact time)    2. Are you having any other symptoms (ex. Dizziness, headache, blurred vision, passed out)? Nausea, Weakness. Pt has to sit down often for fear of passing out   3. What is your BP issue? Pt is concerned that her BP is too low. Pt has not been eating out as much.    Pt wanted to know if her metoprolol needed to go down to 25 mg instead of being 50 mg

## 2020-01-29 NOTE — Telephone Encounter (Signed)
Pt advised the Pharmacist recommendations and will call early next week with an update on how she is doing.

## 2020-01-29 NOTE — Telephone Encounter (Signed)
Pt called to report that her BP systolic has been consistently below 100 since 01/24/20.... during this time she has already decreased the  Chlorthalidone to 1/2 tablet ( 12.5 mg ) since 2/28.  Pt says she has been dieting and cutting out NA in her diet as much as possible but still eating well. She has been very tired and weak.   Her BP yesterday was 96/63, 111/67, 88/70, today 94/64.... HR stays between 79-84.   Pt says she has been drinking fluids but agrees that she could be drinking more and hydrating better.   I advised her that I will forward to Dr. Acie Fredrickson for review and any recommendations re: her meds.

## 2020-01-29 NOTE — Telephone Encounter (Signed)
Please have patient stop chlorthalidone. Continue to monitor BP. Stay well hydrated until BP comes back up.

## 2020-02-01 NOTE — Telephone Encounter (Signed)
Agree with advice from [facility]. Hold chlorthalidone  for now Cont low salt diet Will follow up with her this week

## 2020-02-03 NOTE — Telephone Encounter (Signed)
Follow up  Patient states her BP has still been consistently low, however she does not have any readings documented. She states she would like to inquire about adjusting her Metoprolol medication intake. Please advise.

## 2020-02-04 ENCOUNTER — Telehealth: Payer: Self-pay | Admitting: Neurology

## 2020-02-04 NOTE — Telephone Encounter (Signed)
We can see patient in pharmacist clinic tomorrow if Dr. Acie Fredrickson would like [place] to see. Would have patient bring home cuff to confirm accuracy. If patient HR is not low than she would benefit more from reducing losartan to 50mg  than reducing metoprolol.

## 2020-02-04 NOTE — Progress Notes (Signed)
Patient ID: TAYVIA SIDES                 DOB: 07-10-1956                      MRN: [id number]     HPI: Carolyn Mcguire is a 64 y.o. female referred by Dr. Acie Fredrickson to HTN clinic. PMH is significant for diastolic dysfunction ([id number] EF 55-60%), HTN, heart murmur, hyperlipidemia, LBBB, DOE due to obesity, and mild OSA.   At last visit with Dr. Acie Fredrickson on 01/20/20, patient was placed on a 30-day monitor due to palpitations that sounded consistent with atrial fibrillation. Patient's metoprolol succinate was increased to 50mg  daily, HCTZ was stopped, chlorthalidone 25mg  daily was started, KDUR 3mEq twice daily was started, and losartan 100mg  daily was continued. Two days later, patient endorsed symptoms of dizziness, weakness, and lack of energy. Patient was advised to decrease chlorthalidone to 12.5mg  daily. Patient then endorsed nausea, more weakness, and needing to sit down for fear of passing out on this lower dose with BP readings consistently 80-90's/60's. Patient was asked to discontinue chlorthalidone and stay well hydrated until her BP improves.   Patient presents to clinic for follow up of her HTN. Patient states she has felt better since stopping the chlorthalidone (stopped Saturday 3/6) but still feels tired. She does endorse symptoms of menopause including hot flashes. TSH in December 2020 was WNL. Patient brought in her home blood pressure cuff as well as her home blood pressure readings. Patient noted that her blood pressure after stopping chlorthalidone was 118/73, HR 85 and 105/72, HR 77 and she stated this was taken before she had her morning blood pressure medications. Her blood pressure was taken in clinic and read as follows: 118/66 (right arm home cuff) 139/73 (left arm home cuff); 130/70 (right arm manual) 138/70 (left arm manual). Patient endorsed large improvements in her diet including no longer eating out and cooking all of her meals at home. Patient uses Mrs. DASH salt substitute and  utilizes her air fryer. Patient has lost 6 lbs over the past few weeks.  Current HTN meds: losartan 100mg  daily, metoprolol succinate 50mg  daily Previously tried: chlorthalidone (dizziness, weakness, lack of energy, nausea - in combo with losartan 100mg  daily and metoprolol succinate 50mg  daily) BP goal: <130/80  Family History: obesity (mother), diabetes (mother), heart disease (father), heart attack (maternal grandfather, maternal uncle (age 2)), heart failure (maternal aunt)  Social History: never smoker, denies drug or alcohol use  Diet: hasn't eaten out in 16 days - eating at home; using air fryer, keeps track of sodium on paper, frozen french fries, Mrs DASH, tuna, chicken; cereal and milk, drinking more water instead of tea  Exercise: not addressed  Home BP readings: Prior to stopping chlorthalidone, per phone note: 03-05: 94/64 11:44 am 03-04: 96/63 4:30 pm 03-04: 111/67 2:07 pm 03-04: 88/70 (pt does not have exact time)  After stopping chlorthalidone, per patient's home log: 118/73, HR 85; 105/72, HR 77 - prior to taking morning medications  Wt Readings from Last 3 Encounters:  01/20/20 249 lb (112.9 kg)  01/04/20 254 lb 3.2 oz (115.3 kg)  06/29/19 251 lb 3.2 oz (113.9 kg)   BP Readings from Last 3 Encounters:  01/20/20 (!) 174/96  01/04/20 134/72  06/29/19 (!) 156/76   Pulse Readings from Last 3 Encounters:  01/20/20 75  01/04/20 70  06/29/19 68    Renal function: CrCl cannot be calculated (Patient's most recent  lab result is older than the maximum 21 days allowed.).  Past Medical History:  Diagnosis Date  . Adenomatous colon polyp   . Allergic rhinitis   . Animal bite 05/2015   fox   . Anxiety   . Arthritis    shoulder  . Carpal tunnel syndrome    RIGHT HAND  . Depression   . Diastolic dysfunction    a. 2D echo 09/08/15: EF 55-60%, grade 1 DD, elevated LVEDP, mild MR, mild TR. - normal BNP 08/2015.  Marland Kitchen Essential hypertension   . GERD (gastroesophageal  reflux disease)   . Headache   . Heart murmur   . Hepatic steatosis   . Hyperlipidemia   . Hypothyroidism   . LBBB (left bundle branch block)    a. negative nuclear stress test 06/2011.  . Migraines   . Obesity   . OSA (obstructive sleep apnea)    mild    Current Outpatient Medications on File Prior to Visit  Medication Sig Dispense Refill  . Cholecalciferol (VITAMIN D3) 1000 units CAPS Take by mouth. One daily    . ciclopirox (PENLAC) 8 % solution APPLY TOPICALLY TO AFFECTED TOE NAILS D    . hydrocortisone (ANUSOL-HC) 25 MG suppository Place 1 suppository (25 mg total) rectally at bedtime as needed for hemorrhoids or anal itching. 6 suppository 3  . levothyroxine (SYNTHROID, LEVOTHROID) 88 MCG tablet Take 88 mcg by mouth daily before breakfast.    . losartan (COZAAR) 100 MG tablet Take 100 mg by mouth daily. as directed  3  . metoprolol succinate (TOPROL-XL) 50 MG 24 hr tablet Take 1 tablet (50 mg total) by mouth daily. 90 tablet 3  . potassium chloride (KLOR-CON) 10 MEQ tablet Take 1 tablet (10 mEq total) by mouth 2 (two) times daily. 180 tablet 3  . rizatriptan (MAXALT-MLT) 10 MG disintegrating tablet Take 1 tablet (10 mg total) by mouth as needed for migraine. May repeat in 2 hours if needed 10 tablet 11  . rosuvastatin (CRESTOR) 10 MG tablet Take 10 mg by mouth daily.     No current facility-administered medications on file prior to visit.    Allergies  Allergen Reactions  . Sulfur Rash    MAKES PT RED     Assessment/Plan:  1. Hypertension - Based on BP goal of <130/80, patient is close to her goal. There was a significant variation between her home cuff between her right and left arm with the left arm being roughly 78mmHg higher SBP. Patient was encouraged to continue taking her blood pressure using her left arm. While patient does still feel tired off of her chlorthalidone, patient is no longer feeling dizzy or like she is about to pass out and noted her menopausal  symptoms potentially being the culprit. Patient was agreeable to continuing her current dose of losartan 100mg  daily and metoprolol succinate 50mg  daily through the weekend and, if she feels lightheaded or dizzy, to lower her losartan to 50mg  daily. We will follow up on a BMET today to ensure her electrolytes are normal after being on chlorthalidone and give her a phone call next week to follow up on her home blood pressure readings. Ms. Martello has made great improvements in her diet and was encouraged to continue these changes to lower her blood pressure and help her lose weight.    Ladoris Gene, PharmD Candidate  Ramond Dial, Pharm.D, BCPS, CPP [place]  [id number] [address], [place], [place] [address]  Phone: [phone])  [phone]; Fax: [phone]

## 2020-02-04 NOTE — Telephone Encounter (Signed)
no to the covid questions MR Brain wo contrast [place]: [id number] (exp. 07/31/20) patient is scheduled at [facility] for 02/09/20.

## 2020-02-04 NOTE — Telephone Encounter (Signed)
I would like for patient to be seen in the Pharm D Hypertension clinic .  Thank you  Carolyn Mcguire , MD

## 2020-02-05 ENCOUNTER — Other Ambulatory Visit: Payer: Self-pay

## 2020-02-05 ENCOUNTER — Ambulatory Visit (INDEPENDENT_AMBULATORY_CARE_PROVIDER_SITE_OTHER): Payer: BC Managed Care – PPO | Admitting: Pharmacist

## 2020-02-05 VITALS — BP 130/70 | HR 77 | Wt 243.0 lb

## 2020-02-05 DIAGNOSIS — I10 Essential (primary) hypertension: Secondary | ICD-10-CM | POA: Diagnosis not present

## 2020-02-05 NOTE — Patient Instructions (Addendum)
It was great meeting you today!  Your blood pressure is close to your goal of <130/80. Continue taking your blood pressure at home 1-2 hours after your medications but try to take it in your left arm. Continue taking losartan 100mg  daily and metoprolol succinate 50mg  daily.   We will take some labs today and follow up on Monday with the results!

## 2020-02-06 LAB — BASIC METABOLIC PANEL
BUN/Creatinine Ratio: 28 (ref 12–28)
BUN: 32 mg/dL — ABNORMAL HIGH (ref 8–27)
CO2: 23 mmol/L (ref 20–29)
Calcium: 9.1 mg/dL (ref 8.7–10.3)
Chloride: 101 mmol/L (ref 96–106)
Creatinine, Ser: 1.14 mg/dL — ABNORMAL HIGH (ref 0.57–1.00)
GFR calc Af Amer: 60 mL/min/{1.73_m2} (ref >59)
GFR calc non Af Amer: 52 mL/min/{1.73_m2} — ABNORMAL LOW (ref >59)
Glucose: 121 mg/dL — ABNORMAL HIGH (ref 65–99)
Potassium: 4.1 mmol/L (ref 3.5–5.2)
Sodium: 140 mmol/L (ref 134–144)

## 2020-02-08 ENCOUNTER — Telehealth: Payer: Self-pay

## 2020-02-08 NOTE — Telephone Encounter (Addendum)
Spoke with Carolyn Mcguire over the phone regarding her BMET results from this past Friday. While her Scr did increase to 1.14, we have stopped her chlorthalidone and expect this to return to baseline.  Patient was encouraged to drink more water. Her potassium is normal and she was encouraged to continue her potassium supplement. Patient is scheduled for follow up labs on 3/25.   Patient continued on losartan 100mg  daily and metoprolol succinate 50mg  daily over the weekend and reported her blood pressure in her left arm on Saturday was 109/82mmHg. Patient denies feelings of dizziness or lightheadedness. Patient to continue this regimen and follow up 3/25 with nursing to check her blood pressure in clinic as well as undergo an ECHO. We will follow up with patient's home blood pressure readings at that time.

## 2020-02-08 NOTE — Telephone Encounter (Signed)
Called patient again and advised her to drink more water. Patient wanted to know K level. Advised of level and to continue K supplement. Blood pressure this AM was 106/70. States she felt fine.

## 2020-02-09 ENCOUNTER — Other Ambulatory Visit: Payer: Self-pay

## 2020-02-09 ENCOUNTER — Ambulatory Visit: Payer: BC Managed Care – PPO

## 2020-02-09 DIAGNOSIS — R519 Headache, unspecified: Secondary | ICD-10-CM

## 2020-02-09 DIAGNOSIS — G8929 Other chronic pain: Secondary | ICD-10-CM

## 2020-02-11 ENCOUNTER — Telehealth: Payer: Self-pay | Admitting: *Deleted

## 2020-02-11 DIAGNOSIS — H9390 Unspecified disorder of ear, unspecified ear: Secondary | ICD-10-CM

## 2020-02-11 DIAGNOSIS — R519 Headache, unspecified: Secondary | ICD-10-CM

## 2020-02-11 NOTE — Telephone Encounter (Signed)
Called and spoke with pt about normal MRI brain per SS, NP. Pt verbalized understanding.

## 2020-02-11 NOTE — Addendum Note (Signed)
Addended by: Wyvonnia Lora on: 02/11/2020 04:48 PM   Modules accepted: Orders

## 2020-02-11 NOTE — Telephone Encounter (Signed)
-----   Message from Suzzanne Cloud, NP sent at 02/11/2020 10:32 AM EDT ----- Please let the patient know, MRI of the brain was normal.   IMPRESSION:   Normal MRI brain (without).

## 2020-02-11 NOTE — Telephone Encounter (Signed)
Pt called to inform where she would like ENT referral sent to after discussing with insurance   [facility] with [place] phone#[phone]

## 2020-02-11 NOTE — Telephone Encounter (Signed)
Patient is scheduled with Dr. Constance Holster this coming Monday at [facility] and Throat  Can you please referral so I can send on Monday . Thanks Hinton Dyer

## 2020-02-11 NOTE — Telephone Encounter (Signed)
Took call from phone staff and spoke with pt. She had further questions. She wanted to know if MRI included ear canals. Has "fluttering" in her ears. Worse when she had MRI completed.  Advised it does not specifically evaluate this. She is wanting to be referred back to ENT, wanting suggestion on who to go to. Placed her on hold and spoke with Judson Roch, NP. She recommended [facility] to pt. She states they are out of network. She asked about Dr. Constance Holster. Advised he is with [facility] as well. She will call insurance to see who is in network and call back to let us know prior to placing referral to ENT.

## 2020-02-12 ENCOUNTER — Ambulatory Visit (INDEPENDENT_AMBULATORY_CARE_PROVIDER_SITE_OTHER): Payer: BC Managed Care – PPO

## 2020-02-12 DIAGNOSIS — R002 Palpitations: Secondary | ICD-10-CM

## 2020-02-15 ENCOUNTER — Other Ambulatory Visit: Payer: Self-pay | Admitting: Cardiovascular Disease

## 2020-02-15 ENCOUNTER — Telehealth: Payer: Self-pay | Admitting: Cardiovascular Disease

## 2020-02-15 DIAGNOSIS — H93A3 Pulsatile tinnitus, bilateral: Secondary | ICD-10-CM | POA: Diagnosis not present

## 2020-02-15 DIAGNOSIS — G43909 Migraine, unspecified, not intractable, without status migrainosus: Secondary | ICD-10-CM | POA: Diagnosis not present

## 2020-02-15 DIAGNOSIS — H9313 Tinnitus, bilateral: Secondary | ICD-10-CM | POA: Diagnosis not present

## 2020-02-15 MED ORDER — METOPROLOL SUCCINATE ER 50 MG PO TB24: 50.0000 mg | ORAL_TABLET | Freq: Every day | ORAL | 3 refills | Status: DC

## 2020-02-15 NOTE — Telephone Encounter (Signed)
Nahser, Wonda Cheng, MD  You; Ann Maki, Lanice Schwab, RN 8 minutes ago (11:30 AM)      Ok to reschedule the echo It will be fine for her to come in for nurse visit as scheduled.   This was to assess her BP and is independent of her echo.  We will call her after the echo to discuss those results after the echo .     Pt made aware of the above recommendations. She verbalized understanding.

## 2020-02-15 NOTE — Telephone Encounter (Signed)
Referral sent [phone]

## 2020-02-15 NOTE — Telephone Encounter (Signed)
Carolyn Mcguire is calling in regards to her nurse visit, labs, and echo originally scheduled for 02/18/20. She states she called the cardiac event monitor company and they stated she would need to reschedule her echo until she is no longer wearing the monitor. The echo has been rescheduled for 03/15/20 at 1:45 PM. Carolyn Mcguire would still like to come in for the nurse visit and labs on 02/18/20, but wanted to clarify this with a nurse due to rescheduling the Echo. Carolyn Mcguire is going to another appointment this morning so if she is unable to answer please leave a detailed message.

## 2020-02-15 NOTE — Telephone Encounter (Signed)
Ok to reschedule the echo It will be fine for her to come in for nurse visit as scheduled.   This was to assess her BP and is independent of her echo.  We will call her after the echo to discuss those results after the echo .

## 2020-02-18 ENCOUNTER — Other Ambulatory Visit: Payer: Self-pay

## 2020-02-18 ENCOUNTER — Other Ambulatory Visit (HOSPITAL_COMMUNITY): Payer: BC Managed Care – PPO

## 2020-02-18 ENCOUNTER — Ambulatory Visit: Payer: BC Managed Care – PPO | Admitting: Nurse Practitioner

## 2020-02-18 ENCOUNTER — Other Ambulatory Visit: Payer: BC Managed Care – PPO | Admitting: *Deleted

## 2020-02-18 VITALS — BP 132/66 | HR 70 | Ht 65.0 in | Wt 248.1 lb

## 2020-02-18 DIAGNOSIS — R0600 Dyspnea, unspecified: Secondary | ICD-10-CM | POA: Diagnosis not present

## 2020-02-18 DIAGNOSIS — I10 Essential (primary) hypertension: Secondary | ICD-10-CM

## 2020-02-18 NOTE — Progress Notes (Signed)
1.) Reason for visit: BP check  2.) Name of MD requesting visit: Dr. Acie Fredrickson  3.) H&P: Pt returns for BP follow-up after recent medication changes made 2 weeks ago and follow-up in hypertension clinic. Patient states she is tolerating medications well, feeling well, and home BP readings are consistently less than 130/80 mmHg  ROS related to problem: Patient denies concerns. She is in NAD.   4.) Assessment and plan per MD: Continue current medications and to monitor BP at home. Continue to work on good diet and exercise plan and follow-up in 6 months. Call back sooner if BP is consistently elevated >130/80

## 2020-02-18 NOTE — Patient Instructions (Signed)
Medication Instructions:  Your physician recommends that you continue on your current medications as directed. Please refer to the Current Medication list given to you today.  *If you need a refill on your cardiac medications before your next appointment, please call your pharmacy*   Lab Work: TODAY - BMET If you have labs (blood work) drawn today and your tests are completely normal, you will receive your results only by: Marland Kitchen MyChart Message (if you have MyChart) OR . A paper copy in the mail If you have any lab test that is abnormal or we need to change your treatment, we will call you to review the results.   Testing/Procedures: None Ordered   Follow-Up: At [facility], you and your health needs are our priority.  As part of our continuing mission to provide you with exceptional heart care, we have created designated Provider Care Teams.  These Care Teams include your primary Cardiologist (physician) and Advanced Practice Providers (APPs -  Physician Assistants and Nurse Practitioners) who all work together to provide you with the care you need, when you need it.  We recommend signing up for the patient portal called "MyChart".  Sign up information is provided on this After Visit Summary.  MyChart is used to connect with patients for Virtual Visits (Telemedicine).  Patients are able to view lab/test results, encounter notes, upcoming appointments, etc.  Non-urgent messages can be sent to your provider as well.   To learn more about what you can do with MyChart, go to [email or web address].    Your next appointment:   6 month(s)  The format for your next appointment:   Either In Person or Virtual  Provider:   You may see Mertie Moores, MD or one of the following Advanced Practice Providers on your designated Care Team:    Richardson Dopp, PA-C  [place], [place]  Daune Perch, [place]

## 2020-02-19 LAB — BASIC METABOLIC PANEL
BUN/Creatinine Ratio: 12 (ref 12–28)
BUN: 11 mg/dL (ref 8–27)
CO2: 26 mmol/L (ref 20–29)
Calcium: 9.1 mg/dL (ref 8.7–10.3)
Chloride: 104 mmol/L (ref 96–106)
Creatinine, Ser: 0.94 mg/dL (ref 0.57–1.00)
GFR calc Af Amer: 76 mL/min/{1.73_m2} (ref >59)
GFR calc non Af Amer: 66 mL/min/{1.73_m2} (ref >59)
Glucose: 93 mg/dL (ref 65–99)
Potassium: 3.9 mmol/L (ref 3.5–5.2)
Sodium: 146 mmol/L — ABNORMAL HIGH (ref 134–144)

## 2020-03-07 DIAGNOSIS — L308 Other specified dermatitis: Secondary | ICD-10-CM | POA: Diagnosis not present

## 2020-03-07 DIAGNOSIS — L304 Erythema intertrigo: Secondary | ICD-10-CM | POA: Diagnosis not present

## 2020-03-15 ENCOUNTER — Ambulatory Visit (HOSPITAL_COMMUNITY): Payer: BC Managed Care – PPO | Attending: Cardiovascular Disease

## 2020-03-15 ENCOUNTER — Other Ambulatory Visit: Payer: Self-pay

## 2020-03-15 DIAGNOSIS — I447 Left bundle-branch block, unspecified: Secondary | ICD-10-CM | POA: Diagnosis not present

## 2020-03-15 DIAGNOSIS — R0600 Dyspnea, unspecified: Secondary | ICD-10-CM | POA: Diagnosis not present

## 2020-03-16 ENCOUNTER — Telehealth: Payer: Self-pay | Admitting: Cardiovascular Disease

## 2020-03-16 NOTE — Telephone Encounter (Signed)
Patient returning call for echo results. 

## 2020-03-17 ENCOUNTER — Encounter: Payer: Self-pay | Admitting: Nurse Practitioner

## 2020-03-17 NOTE — Telephone Encounter (Signed)
Patient calling back for echo results  

## 2020-03-17 NOTE — Telephone Encounter (Signed)
Echo results reviewed with patient and questions were answered to her satisfaction. She requests a copy be mailed to her and I advised that I will send a letter to her with the information but that there will be a lot of additional information in the report that she will not likely understand. She verbalized understanding and thanked me for the call.

## 2020-04-19 ENCOUNTER — Ambulatory Visit: Payer: BC Managed Care – PPO | Admitting: Cardiovascular Disease

## 2020-04-26 DIAGNOSIS — B373 Candidiasis of vulva and vagina: Secondary | ICD-10-CM | POA: Diagnosis not present

## 2020-04-26 DIAGNOSIS — N760 Acute vaginitis: Secondary | ICD-10-CM | POA: Diagnosis not present

## 2020-06-06 DIAGNOSIS — L239 Allergic contact dermatitis, unspecified cause: Secondary | ICD-10-CM | POA: Diagnosis not present

## 2020-06-16 DIAGNOSIS — L600 Ingrowing nail: Secondary | ICD-10-CM | POA: Diagnosis not present

## 2020-07-05 ENCOUNTER — Ambulatory Visit: Payer: BC Managed Care – PPO | Admitting: Neurology

## 2020-07-25 DIAGNOSIS — Z1151 Encounter for screening for human papillomavirus (HPV): Secondary | ICD-10-CM | POA: Diagnosis not present

## 2020-07-25 DIAGNOSIS — Z01419 Encounter for gynecological examination (general) (routine) without abnormal findings: Secondary | ICD-10-CM | POA: Diagnosis not present

## 2020-07-25 DIAGNOSIS — Z6841 Body Mass Index (BMI) 40.0 and over, adult: Secondary | ICD-10-CM | POA: Diagnosis not present

## 2020-07-25 DIAGNOSIS — Z8041 Family history of malignant neoplasm of ovary: Secondary | ICD-10-CM | POA: Diagnosis not present

## 2020-08-02 DIAGNOSIS — Z1231 Encounter for screening mammogram for malignant neoplasm of breast: Secondary | ICD-10-CM | POA: Diagnosis not present

## 2020-08-09 DIAGNOSIS — M545 Low back pain: Secondary | ICD-10-CM | POA: Diagnosis not present

## 2020-08-09 DIAGNOSIS — M1612 Unilateral primary osteoarthritis, left hip: Secondary | ICD-10-CM | POA: Diagnosis not present

## 2020-08-11 DIAGNOSIS — S63681A Other sprain of right thumb, initial encounter: Secondary | ICD-10-CM | POA: Diagnosis not present

## 2020-08-11 DIAGNOSIS — M1811 Unilateral primary osteoarthritis of first carpometacarpal joint, right hand: Secondary | ICD-10-CM | POA: Diagnosis not present

## 2020-08-16 ENCOUNTER — Ambulatory Visit: Payer: BC Managed Care – PPO | Admitting: Physician Assistant

## 2020-08-19 DIAGNOSIS — H04123 Dry eye syndrome of bilateral lacrimal glands: Secondary | ICD-10-CM | POA: Diagnosis not present

## 2020-08-19 DIAGNOSIS — H2513 Age-related nuclear cataract, bilateral: Secondary | ICD-10-CM | POA: Diagnosis not present

## 2020-09-23 DIAGNOSIS — Z23 Encounter for immunization: Secondary | ICD-10-CM | POA: Diagnosis not present

## 2020-09-27 DIAGNOSIS — I10 Essential (primary) hypertension: Secondary | ICD-10-CM | POA: Diagnosis not present

## 2020-09-27 DIAGNOSIS — E7800 Pure hypercholesterolemia, unspecified: Secondary | ICD-10-CM | POA: Diagnosis not present

## 2020-09-27 DIAGNOSIS — Z0000 Encounter for general adult medical examination without abnormal findings: Secondary | ICD-10-CM | POA: Diagnosis not present

## 2020-09-27 DIAGNOSIS — E039 Hypothyroidism, unspecified: Secondary | ICD-10-CM | POA: Diagnosis not present

## 2020-09-30 DIAGNOSIS — B373 Candidiasis of vulva and vagina: Secondary | ICD-10-CM | POA: Diagnosis not present

## 2020-10-05 DIAGNOSIS — I447 Left bundle-branch block, unspecified: Secondary | ICD-10-CM | POA: Diagnosis not present

## 2020-10-05 DIAGNOSIS — E039 Hypothyroidism, unspecified: Secondary | ICD-10-CM | POA: Diagnosis not present

## 2020-10-05 DIAGNOSIS — Z0001 Encounter for general adult medical examination with abnormal findings: Secondary | ICD-10-CM | POA: Diagnosis not present

## 2020-10-06 ENCOUNTER — Other Ambulatory Visit: Payer: Self-pay | Admitting: Internal Medicine

## 2020-10-06 DIAGNOSIS — L820 Inflamed seborrheic keratosis: Secondary | ICD-10-CM | POA: Diagnosis not present

## 2020-10-06 DIAGNOSIS — L570 Actinic keratosis: Secondary | ICD-10-CM | POA: Diagnosis not present

## 2020-10-06 DIAGNOSIS — E041 Nontoxic single thyroid nodule: Secondary | ICD-10-CM

## 2020-10-10 ENCOUNTER — Ambulatory Visit: Payer: BC Managed Care – PPO | Admitting: Neurology

## 2020-10-10 ENCOUNTER — Encounter: Payer: Self-pay | Admitting: Neurology

## 2020-10-10 ENCOUNTER — Other Ambulatory Visit: Payer: Self-pay

## 2020-10-10 VITALS — BP 132/84 | HR 74 | Ht 65.0 in | Wt 246.2 lb

## 2020-10-10 DIAGNOSIS — R519 Headache, unspecified: Secondary | ICD-10-CM

## 2020-10-10 DIAGNOSIS — G4733 Obstructive sleep apnea (adult) (pediatric): Secondary | ICD-10-CM | POA: Diagnosis not present

## 2020-10-10 NOTE — Patient Instructions (Signed)
Let me know if you want to start Topamax  Encourage you to consider weight loss  Consider another sleep study  Come back in 6 months

## 2020-10-10 NOTE — Progress Notes (Signed)
PATIENT: Carolyn Mcguire DOB: 1956/08/09  REASON FOR VISIT: follow up HISTORY FROM: patient  HISTORY OF PRESENT ILLNESS: Today 10/10/20  HISTORY  HISTORY 02/27/17 Carolyn Mcguire a 64 years old right-handed female, seen in refer by her primary care doctor Carolyn Mcguire evaluation of headache, initial evaluation was on February 27 2017. She has gone through stressful few years, multiple close family member passed away in a short period of time, she had depression anxiety, around 2016, she began to have frequent headaches, vortex, occipital region pressure headaches, at the same time she had constant daily pulsatile tinnitus, Her headache has become daily, today she reported 6/10 headache,, she is only taking Tylenol as needed, she also complains of depression anxiety, Triggerfor her headaches areexercise, stress, weather change, sleep deprivation, hot, noise, smell, perfume, smoke Over the years, she was seen by ENT, and other specialists  I reviewed the records she brought from ENT Dr. Minna Mcguire, August 01 2016, audiogramin October 2013 was unremarkable, discrimination score was 96%,  MRI of the brain on Apr 25 2016 from [facility], unremarkable MRI of the brain and temporal bone,  [facility] orthopedic evaluation on December 07 2016, complained of neck pain, x-ray showed mild degenerative changes and facet disease at multiple levels, physical therapy was ordered, MRI of cervical spine was ordered, I also reviewed the record from [facility] tinnitus evaluation, findings indicate hearing within normal limits in both ear, with a steeply sloping hearing loss in the extended high frequency only, 12.5-16 K hertz, speech recognition score was excellent, notably like stress and the psychological condition such as anxiety or depression cane exacerbate hersymptoms, interfere with treatment, in her case, stress likely is theaggravating factors that contributes to her tinnitus  disturbance I reviewed and summarized referring note, she had a history of hypertension, hyperlipidemia, had a history of right hemithyroidectomy, for benign thyroid nodule, Laboratory evaluations in March 2018, normal BMP, CBC, TSH,  I personally reviewed cervical x-ray, mild degenerative changes UPDATE 07/13/2018[name], 64 year old female returns for follow-up. She has a history of daily headaches tension type with some migraine features. Due to the significant stressors in her life she was placed on Effexor by Dr. Krista Mcguire, however she never got the prescription filled and does not want to be on antidepressant. She had multiple family members pass away in short period of time. Today she also complains of daytime drowsiness she is morbidly obese and snores. She has a history of hypertension and hyperlipidemia. MRI of the brain in 2017 was normal. She returns for reevaluation. 10/15/2018CMMs. Carolyn Mcguire, 64 year old female returns for follow-up with a history of daily headaches tension type with migraine features. She has a lot of stress in her life but does not want to be on an antidepressant. When last seen she was given Imitrex to try acutely for her migraines however she felt it made her feel worse. She has been diagnosed with mild sleep apnea and instead of using CPAP she opted for a dental appliance. This referral was sent to her dentist but she has not heard anything. She has a history of hypertension and hyperlipidemia. She is aware that both of these conditions would benefit from her losing weight and exercising. She returns for reevaluation UPDATE3/20/19 Carolyn Mcguire, 64 year old female returns for follow-up with history of daily headaches tension type with migraine features. We reviewed the records from [facility] tinnitus evaluationfindings indicate hearing within normal limits in both ear, with a steeply sloping hearing loss in the extended high frequency only,  12.5-16 K hertz, speech recognition  score was excellent, notably like stress and the psychological condition such as anxiety or depression cane exacerbate hersymptoms, interfere with treatment, in her case, stress likely is theaggravating factors that contributes to her tinnitus disturbance. She feels Maxalt works better for her headaches and Imitrex and she uses it 2-4 times a month. Many times when she gets a headache she just uses an ice pack for about 10 minutes and it goes away. She remains on Toprol 25 daily. Sometimes her headaches start in the back of the neck move up. She had gotten a prescription for some physical therapy but could not afford to pay the co-pay. Sleep study positive for mild obstructive sleep apnea however she could not afford the dental appliance to treat this. She does say she walks every day for exercise she remains overweight she returns for reevaluation. 12/11/2019CMMs.Carolyn Mcguire,64 year old female returns for follow-up with history of tension type headaches with migraine features.Some of her headaches are related to stress and others are related to her tinnitus. She does not want to be on any antidepressant. She takes Maxalt acutely. Since last seen she had lost some weight and felt much better however she has gained that back. She was encouraged to try weight watchers. She also has a history of mild obstructive sleep apnea however her insurance would not pay for dental appliance. She continues to walk every day for exercise. Odors are a migraine trigger for her. She also has intermittent neck pain that causes headache. She has a history of hypertension and hyperlipidemia and weight loss would be beneficial for both of these disease processes. She returns for reevaluation Update June 28, 6865 Carolyn Mcguire:64 year old with history of tension type headaches and migraine features. She does not want to take an antidepressant, she remains on Maxalt as needed. She has mild OSA, was unable to afford a dental  appliance. Today, she thinks her headaches may be worse. She has daily headache, pounding, occasional photophobia.She says she wakes up with a headache in the morning. At night the pain is worse. She is able to go about her daily activities. She says the pain is located to her entire head. She does have history of tinnitus.Her ENT doctor recently retired. She says since last visit she has gained 20 pounds. She is not very active.  Update January 04, 2020 Carolyn Mcguire: Carolyn Mcguire is a 64 year old female with a history of tension type headaches and migraine features.  She continues to report daily headache in the morning, is dull, can be associated with migraine features of photophobia, phonophobia, nausea but does not require medication intervention.  She describes a more intense headache, at will occur in the evenings, that she will take Maxalt, that will take the edge off, maybe 1-2 times a week.  She continues to have fluttering in her ears, has been chronic, her ENT retired.  She did not pursue the dental device, due to the expense.  In 2018, a sleep study revealed mild OSA, weight loss and CPAP was recommended.  She has not been able to lose any weight.  She presents today, complaining of persistent headache.  She has been resistant to trying medications previously. She denies any new symptoms. She presents today for evaluation unaccompanied.  Update October 10, 2020 Carolyn Mcguire: Here today for follow-up unaccompanied, continues to report daily dull headache, generalized, reports photophobia, phonophobia, nausea.  Takes Maxalt for severe headache, has not taken in 1 month, uses cool pack.  Is working on weight loss,  has lost about 8 pounds since February, going to the gym.  Is going to see her PCP about starting weight loss medication.  Previously been told mild sleep apnea, in 2018, pursue dental device, was too expensive. MRI of the brain in March 2021 was normal. Noted less frequent sound in her ears (someone  hitting something), saw ENT, normal structurally.  REVIEW OF SYSTEMS: Out of a complete 14 system review of symptoms, the patient complains only of the following symptoms, and all other reviewed systems are negative.  Headache  ALLERGIES: Allergies  Allergen Reactions   Sulfur Rash    MAKES PT RED    HOME MEDICATIONS: Outpatient Medications Prior to Visit  Medication Sig Dispense Refill   Cholecalciferol (VITAMIN D3) 1000 units CAPS Take by mouth. One daily     ciclopirox (PENLAC) 8 % solution APPLY TOPICALLY TO AFFECTED TOE NAILS D     hydrocortisone (ANUSOL-HC) 25 MG suppository Place 1 suppository (25 mg total) rectally at bedtime as needed for hemorrhoids or anal itching. 6 suppository 3   levothyroxine (SYNTHROID, LEVOTHROID) 88 MCG tablet Take 88 mcg by mouth daily before breakfast.     losartan (COZAAR) 100 MG tablet Take 100 mg by mouth daily. as directed  3   metoprolol succinate (TOPROL-XL) 50 MG 24 hr tablet Take 1 tablet (50 mg total) by mouth daily. 90 tablet 3   potassium chloride (KLOR-CON) 10 MEQ tablet Take 1 tablet (10 mEq total) by mouth 2 (two) times daily. 180 tablet 3   rizatriptan (MAXALT-MLT) 10 MG disintegrating tablet Take 1 tablet (10 mg total) by mouth as needed for migraine. May repeat in 2 hours if needed 10 tablet 11   rosuvastatin (CRESTOR) 10 MG tablet Take 10 mg by mouth daily.     No facility-administered medications prior to visit.    PAST MEDICAL HISTORY: Past Medical History:  Diagnosis Date   Adenomatous colon polyp    Allergic rhinitis    Animal bite 05/2015   fox    Anxiety    Arthritis    shoulder   Carpal tunnel syndrome    RIGHT HAND   Depression    Diastolic dysfunction    a. 2D echo 09/08/15: EF 55-60%, grade 1 DD, elevated LVEDP, mild MR, mild TR. - normal BNP 08/2015.   Essential hypertension    GERD (gastroesophageal reflux disease)    Headache    Heart murmur    Hepatic steatosis     Hyperlipidemia    Hypothyroidism    LBBB (left bundle branch block)    a. negative nuclear stress test 06/2011.   Migraines    Obesity    OSA (obstructive sleep apnea)    mild    PAST SURGICAL HISTORY: Past Surgical History:  Procedure Laterality Date   DERMOID CYST  EXCISION     OF HER RIGHT OVARY. SIZE OF A GRAPEFRUIT. OVARY IS STILL THERE.   HEMITHYROIDECTOMY     RUPTURE BELLY BUTTON     WHEN SHE WAS AN INFANT   TONSILECTOMY, ADENOIDECTOMY, BILATERAL MYRINGOTOMY AND TUBES     AGE 46, pt states she didn't have tubes put into her ears    FAMILY HISTORY: Family History  Problem Relation Age of Onset   Ovarian cancer Mother        dx. 34-59; TAH-BSO at 45   Diabetes Mother    Pancreatic cancer Mother 90       d. 32   Skin cancer Mother  maybe basal cell carcinoma   Obesity Mother    Heart disease Father    Bladder Cancer Father        dx 4-81   Skin cancer Father        possibly melanoma; +sun exposure   Cancer Brother 31       salivary gland cancer; d. 18; no tobacco use   Colon cancer Maternal Uncle        dx late 73s; d. early 38s   Ovarian cancer Maternal Grandmother        d. 60; w/ mets   Breast cancer Maternal Aunt        dx unspecified age; s/p mastectomy   Other Maternal Aunt        intellectual disabilities   Cancer Paternal Uncle        CLL, dx. 27   Heart attack Maternal Grandfather        d. 72   Heart attack Maternal Uncle 51   Heart failure Maternal Aunt 86       acute diastolic heart failure   Esophageal cancer Neg Hx    Rectal cancer Neg Hx    Stomach cancer Neg Hx     SOCIAL HISTORY: Social History   Socioeconomic History   Marital status: Single    Spouse name: Not on file   Number of children: 0   Years of education: HS   Highest education level: Not on file  Occupational History   Occupation: Unemployed  Tobacco Use   Smoking status: Never Smoker   Smokeless tobacco: Never Used    Scientific laboratory technician Use: Never used  Substance and Sexual Activity   Alcohol use: No   Drug use: No   Sexual activity: Not Currently  Other Topics Concern   Not on file  Social History Narrative   Lives at home alone.   Right-handed.   She does not drink caffeine.  She will occasionally eat chocolate.   Social Determinants of Health   Financial Resource Strain:    Difficulty of Paying Living Expenses: Not on file  Food Insecurity:    Worried About Charity fundraiser in the Last Year: Not on file   [employer] of Food in the Last Year: Not on file  Transportation Needs:    Lack of Transportation (Medical): Not on file   Lack of Transportation (Non-Medical): Not on file  Physical Activity:    Days of Exercise per Week: Not on file   Minutes of Exercise per Session: Not on file  Stress:    Feeling of Stress : Not on file  Social Connections:    Frequency of Communication with Friends and Family: Not on file   Frequency of Social Gatherings with Friends and Family: Not on file   Attends Religious Services: Not on file   Active Member of Clubs or Organizations: Not on file   Attends Archivist Meetings: Not on file   Marital Status: Not on file  Intimate Partner Violence:    Fear of Current or Ex-Partner: Not on file   Emotionally Abused: Not on file   Physically Abused: Not on file   Sexually Abused: Not on file   PHYSICAL EXAM  Vitals:   10/10/20 1241  BP: 132/84  Pulse: 74  Weight: 246 lb 3.2 oz (111.7 kg)  Height: 5\' 5"  (1.651 m)   Body mass index is 40.97 kg/m.  Generalized: Well developed, in no acute distress  Neurological examination  Mentation: Alert oriented to time, place, history taking. Follows all commands speech and language fluent Cranial nerve II-XII: Pupils were equal round reactive to light. Extraocular movements were full, visual field were full on confrontational test. Facial sensation and strength were normal.  Head turning and shoulder shrug were normal and symmetric. Motor: The motor testing reveals 5 over 5 strength of all 4 extremities. Good symmetric motor tone is noted throughout.  Sensory: Sensory testing is intact to soft touch on all 4 extremities. No evidence of extinction is noted.  Coordination: Cerebellar testing reveals good finger-nose-finger and heel-to-shin bilaterally.  Gait and station: Gait is normal.  Reflexes: Deep tendon reflexes are symmetric and normal bilaterally.   DIAGNOSTIC DATA (LABS, IMAGING, TESTING) - I reviewed patient records, labs, notes, testing and imaging myself where available.  Lab Results  Component Value Date   WBC 8.5 09/16/2015   HGB 14.4 09/16/2015   HCT 40.1 09/16/2015   MCV 83.9 09/16/2015   PLT 213 09/16/2015      Component Value Date/Time   NA 146 (H) 02/18/2020 1357   K 3.9 02/18/2020 1357   CL 104 02/18/2020 1357   CO2 26 02/18/2020 1357   GLUCOSE 93 02/18/2020 1357   GLUCOSE 89 08/02/2016 1127   BUN 11 02/18/2020 1357   CREATININE 0.94 02/18/2020 1357   CREATININE 0.84 08/02/2016 1127   CALCIUM 9.1 02/18/2020 1357   PROT 7.2 09/16/2015 1026   ALBUMIN 4.4 09/16/2015 1026   AST 18 09/16/2015 1026   ALT 15 09/16/2015 1026   ALKPHOS 85 09/16/2015 1026   BILITOT 0.9 09/16/2015 1026   GFRNONAA 66 02/18/2020 1357   GFRAA 76 02/18/2020 1357   No results found for: CHOL, HDL, LDLCALC, LDLDIRECT, TRIG, CHOLHDL No results found for: HGBA1C No results found for: VITAMINB12 Lab Results  Component Value Date   TSH 1.267 09/16/2015      ASSESSMENT AND PLAN 64 y.o. year old female  has a past medical history of Adenomatous colon polyp, Allergic rhinitis, Animal bite (05/2015), Anxiety, Arthritis, Carpal tunnel syndrome, Depression, Diastolic dysfunction, Essential hypertension, GERD (gastroesophageal reflux disease), Headache, Heart murmur, Hepatic steatosis, Hyperlipidemia, Hypothyroidism, LBBB (left bundle branch block), Migraines,  Obesity, and OSA (obstructive sleep apnea). here with:  1.  Chronic headaches with some migrainous features 2.  Mild obstructive sleep apnea in 2018 -MRI of the brain in March 2021 was normal -Suggested trial of Topamax, working up to 75 mg at bedtime, headaches with migraine features, wants to check into cost first -Continue Maxalt as needed for acute headache -Recommend continued effort on weight loss, down 8 lbs since Feb -Mild OSA in 2018, since 3 years, needs a new sleep study, sleep consultation, headaches in the morning, wants to hold off for now, and focus on weight loss first -Previously tried and failed Effexor, is on Toprol now -Follow-up 6 months or sooner if needed  I spent 20 minutes of face-to-face and non-face-to-face time with patient.  This included previsit chart review, lab review, study review, order entry, electronic health record documentation, patient education.  Butler Denmark, AGNP-C, DNP 10/10/2020, 1:14 PM [facility] [address], [place], [place] [address] [phone]

## 2020-10-27 ENCOUNTER — Ambulatory Visit
Admission: RE | Admit: 2020-10-27 | Discharge: 2020-10-27 | Disposition: A | Discharge status: Home / Self Care | Payer: BC Managed Care – PPO | Source: Ambulatory Visit | Attending: Internal Medicine | Admitting: Internal Medicine

## 2020-10-27 DIAGNOSIS — E041 Nontoxic single thyroid nodule: Secondary | ICD-10-CM

## 2020-11-03 DIAGNOSIS — I10 Essential (primary) hypertension: Secondary | ICD-10-CM | POA: Diagnosis not present

## 2020-11-03 DIAGNOSIS — R7303 Prediabetes: Secondary | ICD-10-CM | POA: Diagnosis not present

## 2021-01-22 ENCOUNTER — Encounter: Payer: Self-pay | Admitting: Cardiovascular Disease

## 2021-01-22 NOTE — Progress Notes (Signed)
Carolyn Mcguire Date of Birth  12-Dec-1955 [facility] [id number]    [place], [place]  [address] [phone]  Fax  [phone]  Problem list: 1. Left bundle branch block 2. Hypothyroidism 3. Hyperlipidemia 4. Hypertension 5. Dyspnea on exertion      65 yo female with a hx of LBBB.  Stress myoview was negative.  She's been on a diet and exercise program and has lost about 6 pounds since last year.  She is drinking more water, avoiding fast foods.  She is walking regularly.  She has been diagnosed with migraines.  She thinks muscle tension may be contributing to her headaches.  Oct. 5, 2016:  Carolyn Mcguire is seen today for follow up of her shortness of breath Has had many deaths in her family and became depressed . Was started on Welbutrin for depression .  She was seen in the ER. Was told by the pharmacist at her primary medical doctors office thought it was due to the discontinuation of her Welbutrin. She is still not feeling 100% better so she was scheduled to see me .  She is still short of breath.    DOE  With walking up from the buildings in the back of her property.  She does not getting any regular exercise. She was bitten by a fox on July 11.  Had a series of rabies shots ( the fox was rabid)  Is retired .   She has had a stressful 2 years.    Several deaths in her family . Has not had the time or inclination  Wants to start a walking program.     No CP   February 21, 2016: Doing well Has lost some weight .   Has lost 9 lbs since I last saw her  Walks regularly   Sept. 7, 2017: Doing well BP is doing well at home. Still walking some  Has been to a nutritionist.   March 2 , 2108: Carolyn Mcguire is seen today .  Follow up for hypertension and hyper-hyperlipidemia, and hypothyroidism. BP is well controlled.   February 28, 2018: Carolyn Mcguire is seen today for follow-up of her hypertension and hyperlipidemia.  She has a left bundle branch block. BP has been ok.   A bit  hight today  Wt = 250  ( up 7 lbs from last year )  Has been eating out lots  .     January 06, 2019: Carolyn Mcguire is seen today for follow-up of her hypertension and hyperlipidemia.  She has a left bundle branch block that is old.  Weight today is 246 pounds which is down 3 pounds from her last visit.  BP is well controlled Has DOE from her weight  January 20, 2020:  Carolyn Mcguire is seen back today for history of hypertension and hyperlipidemia.  She has a left bundle branch block.  Weight today is 249 pounds which is up 3 pounds of her last visit. Had irregular episode of tachycardia yesterday  Lasted several hours Had 2 separate episodes  -she took her blood pressure which was elevated.  She found that her heart rate was irregular in the 120s.  Her heart rate monitor indicated that her heart rate was irregular. Has not been watching her diet.   Still eating salty foods most meals   Feb. 28, 2022: Carolyn Mcguire is seen today for follow up of her HTN, HLD, LBBB and palpitations. She wore a 30 day monitor which did not reveal any evidence  of atrial fib and did not reveal any significant arrhythmias.  Has been busy taking care of her aunt who was in a MVA.  Has not been eating well ( eating lots of pre-prepared foods, not exercising )  She has tried HCTZ in the past but she became too volume depleted and it was stopped.   No CP , dspnea, no syncope  Current Outpatient Medications on File Prior to Visit  Medication Sig Dispense Refill  . amLODipine (NORVASC) 5 MG tablet Take 2.5 mg by mouth daily.    . Cholecalciferol (VITAMIN D3) 1000 units CAPS Take by mouth. One daily    . ciclopirox (PENLAC) 8 % solution APPLY TOPICALLY TO AFFECTED TOE NAILS D    . hydrocortisone (ANUSOL-HC) 25 MG suppository Place 1 suppository (25 mg total) rectally at bedtime as needed for hemorrhoids or anal itching. 6 suppository 3  . levothyroxine (SYNTHROID, LEVOTHROID) 88 MCG tablet Take 88 mcg by mouth daily before  breakfast.    . losartan (COZAAR) 100 MG tablet Take 100 mg by mouth daily. as directed  3  . metoprolol succinate (TOPROL-XL) 50 MG 24 hr tablet Take 1 tablet (50 mg total) by mouth daily. 90 tablet 3  . rizatriptan (MAXALT-MLT) 10 MG disintegrating tablet Take 1 tablet (10 mg total) by mouth as needed for migraine. May repeat in 2 hours if needed 10 tablet 11  . rosuvastatin (CRESTOR) 10 MG tablet Take 10 mg by mouth daily.     No current facility-administered medications on file prior to visit.    Allergies  Allergen Reactions  . Elemental Sulfur Rash    MAKES PT RED    Past Medical History:  Diagnosis Date  . Adenomatous colon polyp   . Allergic rhinitis   . Animal bite 05/2015   fox   . Anxiety   . Arthritis    shoulder  . Carpal tunnel syndrome    RIGHT HAND  . Depression   . Diastolic dysfunction    a. 2D echo 09/08/15: EF 55-60%, grade 1 DD, elevated LVEDP, mild MR, mild TR. - normal BNP 08/2015.  Marland Kitchen Essential hypertension   . GERD (gastroesophageal reflux disease)   . Headache   . Heart murmur   . Hepatic steatosis   . Hyperlipidemia   . Hypothyroidism   . LBBB (left bundle branch block)    a. negative nuclear stress test 06/2011.  . Migraines   . Obesity   . OSA (obstructive sleep apnea)    mild    Past Surgical History:  Procedure Laterality Date  . DERMOID CYST  EXCISION     OF HER RIGHT OVARY. SIZE OF A GRAPEFRUIT. OVARY IS STILL THERE.  Marland Kitchen HEMITHYROIDECTOMY    . RUPTURE BELLY BUTTON     WHEN SHE WAS AN INFANT  . TONSILECTOMY, ADENOIDECTOMY, BILATERAL MYRINGOTOMY AND TUBES     AGE 64, pt states she didn't have tubes put into her ears    Social History   Tobacco Use  Smoking Status Never Smoker  Smokeless Tobacco Never Used    Social History   Substance and Sexual Activity  Alcohol Use No    Family History  Problem Relation Age of Onset  . Ovarian cancer Mother        dx. 3-59; TAH-BSO at 16  . Diabetes Mother   . Pancreatic cancer  Mother 6       d. 77  . Skin cancer Mother  maybe basal cell carcinoma  . Obesity Mother   . Heart disease Father   . Bladder Cancer Father        dx 51-81  . Skin cancer Father        possibly melanoma; +sun exposure  . Cancer Brother 33       salivary gland cancer; d. 33; no tobacco use  . Colon cancer Maternal Uncle        dx late 60s; d. early 27s  . Ovarian cancer Maternal Grandmother        d. 48; w/ mets  . Breast cancer Maternal Aunt        dx unspecified age; s/p mastectomy  . Other Maternal Aunt        intellectual disabilities  . Cancer Paternal Uncle        CLL, dx. 25  . Heart attack Maternal Grandfather        d. 46  . Heart attack Maternal Uncle 51  . Heart failure Maternal Aunt 86       acute diastolic heart failure  . Esophageal cancer Neg Hx   . Rectal cancer Neg Hx   . Stomach cancer Neg Hx     Reviw of Systems:  Noted in current history, all other systems are negative.Marland Kitchen  Physical Exam: Blood pressure (!) 156/100, pulse 65, height 5\' 5"  (1.651 m), weight 246 lb 9.6 oz (111.9 kg), SpO2 98 %.  GEN:  Well nourished, well developed in no acute distress HEENT: Normal NECK: No JVD; No carotid bruits LYMPHATICS: No lymphadenopathy CARDIAC: RRR , no murmurs, rubs, gallops RESPIRATORY:  Clear to auscultation without rales, wheezing or rhonchi  ABDOMEN: Soft, non-tender, non-distended MUSCULOSKELETAL:  No edema; No deformity  SKIN: Warm and dry NEUROLOGIC:  Alert and oriented x 3      ECG: 01/23/2021: Normal sinus rhythm at 65.  Left bundle branch block.  No changes from previous EKG. .  Assessment / Plan:   1. Left bundle branch block-     Stable , echo from last year shows normal LV function   2. Hypothyroidism-     managed by her primary medical doctor   3. Hyperlipidemia - -managed by her primary medical doctor  4. Hypertension -blood pressures moderately elevated today.  She has been taking care of an aunt who was in a motor vehicle  accident and as result has been eating lots of takeout food and foods that been prepared by others.  She knows that she has been eating too much salt.  She will be returning home this week.  We will start her on hydrochlorothiazide 25 mg a day and potassium chloride 10 mg a day.  We will have her return to see the hypertension clinic in approximately 1 month and will get a basic metabolic profile at that time.   5.  Palpitations:    no recent palps   5. Dyspnea on exertion-  Likely related to her weight  Continue weight loss efforts.   7.  Morbid obesity:   Advised weight loss  Body mass index is 41.04 kg/m.     Mertie Moores, MD  01/23/2021 11:18 AM    [address],  [place], [place]  [address] Pager [phone] Phone: [phone]; Fax: [phone]

## 2021-01-23 ENCOUNTER — Other Ambulatory Visit: Payer: Self-pay

## 2021-01-23 ENCOUNTER — Ambulatory Visit (INDEPENDENT_AMBULATORY_CARE_PROVIDER_SITE_OTHER): Payer: BC Managed Care – PPO | Admitting: Cardiovascular Disease

## 2021-01-23 ENCOUNTER — Encounter: Payer: Self-pay | Admitting: Cardiovascular Disease

## 2021-01-23 VITALS — BP 156/100 | HR 65 | Ht 65.0 in | Wt 246.6 lb

## 2021-01-23 DIAGNOSIS — I447 Left bundle-branch block, unspecified: Secondary | ICD-10-CM

## 2021-01-23 DIAGNOSIS — I5189 Other ill-defined heart diseases: Secondary | ICD-10-CM | POA: Diagnosis not present

## 2021-01-23 DIAGNOSIS — I10 Essential (primary) hypertension: Secondary | ICD-10-CM

## 2021-01-23 MED ORDER — HYDROCHLOROTHIAZIDE 25 MG PO TABS: 25.0000 mg | ORAL_TABLET | Freq: Every day | ORAL | 3 refills | Status: DC

## 2021-01-23 MED ORDER — POTASSIUM CHLORIDE ER 10 MEQ PO TBCR: 10.0000 meq | EXTENDED_RELEASE_TABLET | Freq: Every day | ORAL | 3 refills | Status: DC

## 2021-01-23 NOTE — Patient Instructions (Addendum)
Medication Instructions:  Your physician has recommended you make the following change in your medication:   START HCTZ 25mg  daily START K-dur (potassium) 22mEq daily   *If you need a refill on your cardiac medications before your next appointment, please call your pharmacy*   Lab Work: Return for BMET in one month when you see the hypertension clinic   Testing/Procedures: none   Follow-Up: At [facility], you and your health needs are our priority.  As part of our continuing mission to provide you with exceptional heart care, we have created designated Provider Care Teams.  These Care Teams include your primary Cardiologist (physician) and Advanced Practice Providers (APPs -  Physician Assistants and Nurse Practitioners) who all work together to provide you with the care you need, when you need it.  We recommend signing up for the patient portal called "MyChart".  Sign up information is provided on this After Visit Summary.  MyChart is used to connect with patients for Virtual Visits (Telemedicine).  Patients are able to view lab/test results, encounter notes, upcoming appointments, etc.  Non-urgent messages can be sent to your provider as well.   To learn more about what you can do with MyChart, go to [email or web address].    Your next appointment:   1 year(s)  The format for your next appointment:   In Person  Provider:   You may see Mertie Moores, MD or one of the following Advanced Practice Providers on your designated Care Team:    Richardson Dopp, PA-C  [place], [place]  Other:  You have been referred to see a pharmacist in the hypertension clinic in one month.

## 2021-01-30 ENCOUNTER — Telehealth: Payer: Self-pay | Admitting: Cardiovascular Disease

## 2021-01-30 NOTE — Telephone Encounter (Signed)
Was put on new medication named hydrochlorothiazide (HYDRODIURIL) 25 MG tablet and doesn't feel good taking it. Please call [phone]

## 2021-01-30 NOTE — Telephone Encounter (Signed)
RN spoke with patient regarding side effects of HCTZ. Patient states she is feeling fatigued and weak following the medication. Patient states her blood pressures have been averaging 90/60. She has yet to take the medication this AM, and wanted to check with Dr. Acie Fredrickson before taking. RN to consult with Dr. Acie Fredrickson and return call.   RN spoke with Dr. Acie Fredrickson for orders and recommendations. MD recommends to hold HCTZ and potassium and continue to check her BP. He would like for her to notify us if her blood pressure were to rise again. MD confirms it is okay for patient to cancel upcoming appt with lab and hypertension clinic. Patient verbalized understanding and in agreement.

## 2021-02-20 ENCOUNTER — Other Ambulatory Visit: Payer: BC Managed Care – PPO

## 2021-02-20 ENCOUNTER — Ambulatory Visit: Payer: BC Managed Care – PPO

## 2021-03-14 DIAGNOSIS — S3141XD Laceration without foreign body of vagina and vulva, subsequent encounter: Secondary | ICD-10-CM | POA: Diagnosis not present

## 2021-04-10 ENCOUNTER — Ambulatory Visit: Payer: BC Managed Care – PPO | Admitting: Neurology

## 2021-04-18 DIAGNOSIS — M79641 Pain in right hand: Secondary | ICD-10-CM | POA: Diagnosis not present

## 2021-04-18 DIAGNOSIS — S3141XD Laceration without foreign body of vagina and vulva, subsequent encounter: Secondary | ICD-10-CM | POA: Diagnosis not present

## 2021-04-18 DIAGNOSIS — M1811 Unilateral primary osteoarthritis of first carpometacarpal joint, right hand: Secondary | ICD-10-CM | POA: Diagnosis not present

## 2021-04-18 DIAGNOSIS — M65351 Trigger finger, right little finger: Secondary | ICD-10-CM | POA: Diagnosis not present

## 2021-06-26 DIAGNOSIS — M160 Bilateral primary osteoarthritis of hip: Secondary | ICD-10-CM | POA: Diagnosis not present

## 2021-06-29 DIAGNOSIS — M1811 Unilateral primary osteoarthritis of first carpometacarpal joint, right hand: Secondary | ICD-10-CM | POA: Diagnosis not present

## 2021-06-29 DIAGNOSIS — M65351 Trigger finger, right little finger: Secondary | ICD-10-CM | POA: Diagnosis not present

## 2021-06-29 DIAGNOSIS — M19041 Primary osteoarthritis, right hand: Secondary | ICD-10-CM | POA: Diagnosis not present

## 2021-07-11 DIAGNOSIS — N762 Acute vulvitis: Secondary | ICD-10-CM | POA: Diagnosis not present

## 2021-08-07 DIAGNOSIS — Z1231 Encounter for screening mammogram for malignant neoplasm of breast: Secondary | ICD-10-CM | POA: Diagnosis not present

## 2021-08-21 DIAGNOSIS — H2513 Age-related nuclear cataract, bilateral: Secondary | ICD-10-CM | POA: Diagnosis not present

## 2021-08-21 DIAGNOSIS — H04123 Dry eye syndrome of bilateral lacrimal glands: Secondary | ICD-10-CM | POA: Diagnosis not present

## 2021-08-21 DIAGNOSIS — H40053 Ocular hypertension, bilateral: Secondary | ICD-10-CM | POA: Diagnosis not present

## 2021-10-04 DIAGNOSIS — L600 Ingrowing nail: Secondary | ICD-10-CM | POA: Diagnosis not present

## 2021-10-04 DIAGNOSIS — M79675 Pain in left toe(s): Secondary | ICD-10-CM | POA: Diagnosis not present

## 2021-10-04 DIAGNOSIS — B351 Tinea unguium: Secondary | ICD-10-CM | POA: Diagnosis not present

## 2021-10-04 DIAGNOSIS — M79674 Pain in right toe(s): Secondary | ICD-10-CM | POA: Diagnosis not present

## 2021-10-09 DIAGNOSIS — E039 Hypothyroidism, unspecified: Secondary | ICD-10-CM | POA: Diagnosis not present

## 2021-10-09 DIAGNOSIS — R7303 Prediabetes: Secondary | ICD-10-CM | POA: Diagnosis not present

## 2021-10-09 DIAGNOSIS — Z0000 Encounter for general adult medical examination without abnormal findings: Secondary | ICD-10-CM | POA: Diagnosis not present

## 2021-10-10 DIAGNOSIS — Z0000 Encounter for general adult medical examination without abnormal findings: Secondary | ICD-10-CM | POA: Diagnosis not present

## 2021-10-10 DIAGNOSIS — E039 Hypothyroidism, unspecified: Secondary | ICD-10-CM | POA: Diagnosis not present

## 2021-10-10 DIAGNOSIS — R7303 Prediabetes: Secondary | ICD-10-CM | POA: Diagnosis not present

## 2021-10-12 DIAGNOSIS — R519 Headache, unspecified: Secondary | ICD-10-CM | POA: Diagnosis not present

## 2021-10-12 DIAGNOSIS — E039 Hypothyroidism, unspecified: Secondary | ICD-10-CM | POA: Diagnosis not present

## 2021-10-12 DIAGNOSIS — Z0001 Encounter for general adult medical examination with abnormal findings: Secondary | ICD-10-CM | POA: Diagnosis not present

## 2021-10-12 DIAGNOSIS — Z23 Encounter for immunization: Secondary | ICD-10-CM | POA: Diagnosis not present

## 2021-10-12 DIAGNOSIS — G4733 Obstructive sleep apnea (adult) (pediatric): Secondary | ICD-10-CM | POA: Diagnosis not present

## 2021-10-13 ENCOUNTER — Other Ambulatory Visit: Payer: Self-pay | Admitting: Internal Medicine

## 2021-10-13 ENCOUNTER — Encounter: Payer: Self-pay | Admitting: Neurology

## 2021-10-13 DIAGNOSIS — E041 Nontoxic single thyroid nodule: Secondary | ICD-10-CM

## 2021-10-30 ENCOUNTER — Ambulatory Visit
Admission: RE | Admit: 2021-10-30 | Discharge: 2021-10-30 | Disposition: A | Discharge status: Home / Self Care | Payer: BC Managed Care – PPO | Source: Ambulatory Visit | Attending: Internal Medicine | Admitting: Internal Medicine

## 2021-10-30 DIAGNOSIS — E041 Nontoxic single thyroid nodule: Secondary | ICD-10-CM

## 2021-11-22 DIAGNOSIS — H40053 Ocular hypertension, bilateral: Secondary | ICD-10-CM | POA: Diagnosis not present

## 2021-12-05 NOTE — Progress Notes (Signed)
NEUROLOGY CONSULTATION NOTE  [name] MRN: [id number] DOB: [birth year]  Referring provider: [name], MD Primary care provider: [name], MD  Reason for consult:  migraines  Assessment/Plan:   1  Chronic migraine without aura, without status migrainosus, not intractable - definitely not related to medication overuse.  Possibly stress related, due to tinnitus, or possibly cervicogenic due to degenerative changes in cervical spine. 2  Tinnitus 3  Obstructive sleep apnea  Migraine prevention:  Suggested Aimovig.  She defers for now but will contact me if she wishes to start Migraine rescue:  Will have her try Ubrelvy 100mg  sample. Limit use of pain relievers to no more than 2 days out of week to prevent risk of rebound or medication-overuse headache. Consider magnesium citrate 400mg  daily riboflavin 400mg  daily and increase CoQ 10 to 100mg  three times daily Keep headache diary Recommend using CPAP if one was prescribed. Recommend seeing ENT regarding tinnitus Follow up 6 months.    Subjective:  Carolyn Mcguire is a 66 year old female with HTN, diastolic dysfunction, HLD, hypothyroidism who presents for migraines.  History supplemented by PCP and prior neurologist's notes.  Onset:  Since around 66 years old Location:  diffuse, occasionally just temples or back of head.  Some neck pain (has arthritis in neck) Quality:  pounding Intensity:  8/10.  Aura:  absent Prodrome:  absent Associated symptoms:  Nausea, photophobia, phonophobia.  She denies associated visual disturbance, unilateral numbness or weakness. Duration:  a couple of hours to all day Frequency:  2 days a week Frequency of abortive medication:  Tylenol Arthritis once a week.  Rarely rizatriptan Triggers:  maybe change in weather, stress Relieving factors:  rest in dark room, hot shower, cool pack to head.   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  Current NSAIDS/analgesics:  Tylenol Arthritis (once every 2 weeks) Current triptans:  rizatriptan 10mg  effective but does not take often. Current ergotamine:  none Current anti-emetic:  none Current muscle relaxants:  none Current Antihypertensive medications:  metoprolol succinate, amlodipine, HCTZ, losartan Current Antidepressant medications:  none Current Anticonvulsant medications:  none Current anti-CGRP:  none Current Vitamins/Herbal/Supplements:  CoQ10, KCl Current Antihistamines/Decongestants:  none Other therapy:  cold packs, hot shower Hormone/birth control:  none   Past NSAIDS/analgesics:  naproxen, meloxicam Past abortive triptans:  sumatriptan 50mg  Past abortive ergotamine:  none Past muscle relaxants:  baclofen Past anti-emetic:  Zofran 8mg  Past antihypertensive medications:  furosemide, valsartan Past antidepressant medications:  venlafaxine, sertraline, bupropion Past anticonvulsant medications:  topiramate Past anti-CGRP:  none Past vitamins/Herbal/Supplements:  none Past antihistamines/decongestants:  none Other past therapies:  none  Caffeine:  No coffee.  Drinks tea 1 to 2 times a week Diet:  Tries to increase water intake.  No soda.   Exercise: trying to walk more.   Depression:  some; Anxiety:  some Other pain:  generalized aches, arthritis Sleep hygiene:  Has mild to moderate OSA.  Does not use CPAP.  Sleep is good. 4-6 hours.  Feels tired in the day. Family history of headache:  No       PAST MEDICAL HISTORY: Past Medical History:  Diagnosis Date   Adenomatous colon polyp    Allergic rhinitis    Animal bite 05/2015   fox    Anxiety    Arthritis  shoulder   Carpal tunnel syndrome    RIGHT HAND   Depression    Diastolic dysfunction    a. 2D echo 09/08/15: EF 55-60%, grade 1 DD, elevated LVEDP, mild  MR, mild TR. - normal BNP 08/2015.   Essential hypertension    GERD (gastroesophageal reflux disease)    Headache    Heart murmur    Hepatic steatosis    Hyperlipidemia    Hypothyroidism    LBBB (left bundle branch block)    a. negative nuclear stress test 06/2011.   Migraines    Obesity    OSA (obstructive sleep apnea)    mild    PAST SURGICAL HISTORY: Past Surgical History:  Procedure Laterality Date   DERMOID CYST  EXCISION     OF HER RIGHT OVARY. SIZE OF A GRAPEFRUIT. OVARY IS STILL THERE.   HEMITHYROIDECTOMY     RUPTURE BELLY BUTTON     WHEN SHE WAS AN INFANT   TONSILECTOMY, ADENOIDECTOMY, BILATERAL MYRINGOTOMY AND TUBES     AGE 44, pt states she didn't have tubes put into her ears    MEDICATIONS: Current Outpatient Medications on File Prior to Visit  Medication Sig Dispense Refill   amLODipine (NORVASC) 5 MG tablet Take 2.5 mg by mouth daily.     Cholecalciferol (VITAMIN D3) 1000 units CAPS Take by mouth. One daily     ciclopirox (PENLAC) 8 % solution APPLY TOPICALLY TO AFFECTED TOE NAILS D     hydrochlorothiazide (HYDRODIURIL) 25 MG tablet Take 1 tablet (25 mg total) by mouth daily. 90 tablet 3   hydrocortisone (ANUSOL-HC) 25 MG suppository Place 1 suppository (25 mg total) rectally at bedtime as needed for hemorrhoids or anal itching. 6 suppository 3   levothyroxine (SYNTHROID, LEVOTHROID) 88 MCG tablet Take 88 mcg by mouth daily before breakfast.     losartan (COZAAR) 100 MG tablet Take 100 mg by mouth daily. as directed  3   metoprolol succinate (TOPROL-XL) 50 MG 24 hr tablet Take 1 tablet (50 mg total) by mouth daily. 90 tablet 3   potassium chloride (KLOR-CON) 10 MEQ tablet Take 1 tablet (10 mEq total) by mouth daily. 90 tablet 3   rizatriptan (MAXALT-MLT) 10 MG disintegrating tablet Take 1 tablet (10 mg total) by mouth as needed for migraine. May repeat in 2 hours if needed 10 tablet 11   rosuvastatin (CRESTOR) 10 MG tablet Take 10 mg by mouth daily.     No  current facility-administered medications on file prior to visit.    ALLERGIES: Allergies  Allergen Reactions   Elemental Sulfur Rash    MAKES PT RED    FAMILY HISTORY: Family History  Problem Relation Age of Onset   Ovarian cancer Mother        dx. 51-59; TAH-BSO at 31   Diabetes Mother    Pancreatic cancer Mother 61       d. 57   Skin cancer Mother        maybe basal cell carcinoma   Obesity Mother    Heart disease Father    Bladder Cancer Father        dx 54-81   Skin cancer Father        possibly melanoma; +sun exposure   Cancer Brother 41       salivary gland cancer; d. 52; no tobacco use   Colon cancer Maternal Uncle        dx late 5s; d. early 70s   Ovarian cancer Maternal Grandmother  d. 8; w/ mets   Breast cancer Maternal Aunt        dx unspecified age; s/p mastectomy   Other Maternal Aunt        intellectual disabilities   Cancer Paternal Uncle        CLL, dx. 41   Heart attack Maternal Grandfather        d. 72   Heart attack Maternal Uncle 51   Heart failure Maternal Aunt 86       acute diastolic heart failure   Esophageal cancer Neg Hx    Rectal cancer Neg Hx    Stomach cancer Neg Hx     Objective:  Blood pressure 138/63, pulse 77, height 5\' 4"  (1.626 m), weight 247 lb 9.6 oz (112.3 kg), SpO2 97 %. General: No acute distress.  Patient appears well-groomed.   Head:  Normocephalic/atraumatic Eyes:  fundi examined but not visualized Neck: supple, no paraspinal tenderness, full range of motion Back: No paraspinal tenderness Heart: regular rate and rhythm Lungs: Clear to auscultation bilaterally. Vascular: No carotid bruits. Neurological Exam: Mental status: alert and oriented to person, place, and time, recent and remote memory intact, fund of knowledge intact, attention and concentration intact, speech fluent and not dysarthric, language intact. Cranial nerves: CN I: not tested CN II: pupils equal, round and reactive to light, visual  fields intact CN III, IV, VI:  full range of motion, no nystagmus, no ptosis CN V: facial sensation intact. CN VII: upper and lower face symmetric CN VIII: hearing intact CN IX, X: gag intact, uvula midline CN XI: sternocleidomastoid and trapezius muscles intact CN XII: tongue midline Bulk & Tone: normal, no fasciculations. Motor:  muscle strength 5/5 throughout Sensation:  Pinprick, temperature and vibratory sensation intact. Deep Tendon Reflexes:  2+ throughout,  toes downgoing.   Finger to nose testing:  Without dysmetria.   Heel to shin:  Without dysmetria.   Gait:  Normal station and stride.  Romberg negative.    Thank you for allowing me to take part in the care of this patient.  Metta Clines, DO  CC: [name], MD

## 2021-12-06 ENCOUNTER — Ambulatory Visit: Payer: BC Managed Care – PPO | Admitting: Neurology

## 2021-12-06 ENCOUNTER — Other Ambulatory Visit: Payer: Self-pay

## 2021-12-06 ENCOUNTER — Encounter: Payer: Self-pay | Admitting: Neurology

## 2021-12-06 VITALS — BP 138/63 | HR 77 | Ht 64.0 in | Wt 247.6 lb

## 2021-12-06 DIAGNOSIS — G43709 Chronic migraine without aura, not intractable, without status migrainosus: Secondary | ICD-10-CM

## 2021-12-06 DIAGNOSIS — G4733 Obstructive sleep apnea (adult) (pediatric): Secondary | ICD-10-CM

## 2021-12-06 DIAGNOSIS — H9313 Tinnitus, bilateral: Secondary | ICD-10-CM

## 2021-12-06 NOTE — Patient Instructions (Signed)
°  Consider Aimovig 140mg  injection every 28 days - let me know if you wish to start it.  Take Ubrelvy 100mg  at earliest onset of headache.  May repeat dose once in 2 hours if needed.  Maximum 2 tablets in 24 hours. If effective, let me know and I can enter a prescription. Limit use of pain relievers to no more than 2 days out of the week.  These medications include acetaminophen, NSAIDs (ibuprofen/Advil/Motrin, naproxen/Aleve, triptans (Imitrex/sumatriptan), Excedrin, and narcotics.  This will help reduce risk of rebound headaches. Be aware of common food triggers:  - Caffeine:  coffee, black tea, cola, Mt. Dew  - Chocolate  - Dairy:  aged cheeses (brie, blue, cheddar, gouda, [place], provolone, [place], Swiss, etc), chocolate milk, buttermilk, sour cream, limit eggs and yogurt  - Nuts, peanut butter  - Alcohol  - Cereals/grains:  FRESH breads (fresh bagels, sourdough, doughnuts), yeast productions  - Processed/canned/aged/cured meats (pre-packaged deli meats, hotdogs)  - MSG/glutamate:  soy sauce, flavor enhancer, pickled/preserved/marinated foods  - Sweeteners:  aspartame (Equal, Nutrasweet).  Sugar and Splenda are okay  - Vegetables:  legumes (lima beans, lentils, snow peas, fava beans, pinto peans, peas, garbanzo beans), sauerkraut, onions, olives, pickles  - Fruit:  avocados, bananas, citrus fruit (orange, lemon, grapefruit), mango  - Other:  Frozen meals, macaroni and cheese Routine exercise Stay adequately hydrated (aim for 64 oz water daily) Keep headache diary Maintain proper stress management Maintain proper sleep hygiene Do not skip meals Consider supplements:  magnesium citrate 400mg  daily, riboflavin 400mg  daily, coenzyme Q10 100mg  three times daily.

## 2021-12-27 DIAGNOSIS — L821 Other seborrheic keratosis: Secondary | ICD-10-CM | POA: Diagnosis not present

## 2021-12-27 DIAGNOSIS — D225 Melanocytic nevi of trunk: Secondary | ICD-10-CM | POA: Diagnosis not present

## 2021-12-27 DIAGNOSIS — L718 Other rosacea: Secondary | ICD-10-CM | POA: Diagnosis not present

## 2021-12-27 DIAGNOSIS — L814 Other melanin hyperpigmentation: Secondary | ICD-10-CM | POA: Diagnosis not present

## 2022-01-05 DIAGNOSIS — H833X2 Noise effects on left inner ear: Secondary | ICD-10-CM | POA: Diagnosis not present

## 2022-01-10 ENCOUNTER — Ambulatory Visit: Payer: BC Managed Care – PPO | Admitting: Neurology

## 2022-01-11 NOTE — Progress Notes (Signed)
° ° °  Subjective:    CC: Low back pain  I, Carolyn Mcguire, LAT, ATC, am serving as scribe for Dr. Lynne Mcguire.  HPI: Pt is a 66 y/o female presenting w/ c/o chronic low back pain w/ radiating pain into her R LE.  She locates her pain to her R lower back, buttocks and R post thigh.  She has been diagnosed w/ sciatica by Carolyn Mcguire and at [facility] (Dr. Noemi Mcguire.)  Radiating pain: yes into her R LE to her post knee LE numbness/tingling: yes in her L latera thigh but not in the R LE weakness: no Aggravating factors: driving and pressing on the gas pedal; getting into/out of car; prolonged walking; prolonged sitting Treatments tried: calf stretching  She also reports B hip and groin pain that she has been told is due to hip OA by orthopedics.  Pertinent review of Systems: No fevers or chills  Relevant historical information: Hypertension   Objective:    Vitals:   01/15/22 1102  BP: 130/80  Pulse: 73  SpO2: 98%   General: Well Developed, well nourished, and in no acute distress.   MSK: L-spine: Nontender midline.  Tender palpation paraspinal musculature. Decreased lumbar motion pain with extension rotation lateral flexion. Lower extremity strength is intact  Hips bilaterally tender palpation greater trochanter.  Lab and Radiology Results  X-ray images L-spine obtained today personally and independently interpreted Multilevel DDD worse at the base of the lumbar spine.  No acute fractures are present. Await formal radiology review    Impression and Recommendations:    Assessment and Plan: 66 y.o. female with chronic low back pain. [name] would benefit significantly from formal therapy [facility].  She notes this is probably going to be pretty expensive for her and she has a nephew who is a licensed physical therapist.  I think this is a pretty good idea and recommend that her physical therapy nephew work on core stabilization exercises.  If this is not sufficient then  she can let me know and I would recommend formal physical therapy probably at [place] or at [facility] in [place].  Also recommend adding on her neck and her hip through this physical therapy.  She mentions that she has been told that she has hip arthritis and some anterior lateral hip pain.  It is worthwhile trying some hip therapy first and if not better consider hip injection and repeat x-ray.  PDMP not reviewed this encounter. Orders Placed This Encounter  Procedures   DG Lumbar Spine 2-3 Views    Standing Status:   Future    Number of Occurrences:   1    Standing Expiration Date:   02/12/2022    Order Specific Question:   Reason for Exam (SYMPTOM  OR DIAGNOSIS REQUIRED)    Answer:   low back pain    Order Specific Question:   Preferred imaging location?    Answer:   [facility]   No orders of the defined types were placed in this encounter.   Discussed warning signs or symptoms. Please see discharge instructions. Patient expresses understanding.   The above documentation has been reviewed and is accurate and complete Carolyn Mcguire, M.D.

## 2022-01-15 ENCOUNTER — Encounter: Payer: Self-pay | Admitting: Family Medicine

## 2022-01-15 ENCOUNTER — Ambulatory Visit: Payer: BC Managed Care – PPO | Admitting: Family Medicine

## 2022-01-15 ENCOUNTER — Other Ambulatory Visit: Payer: Self-pay

## 2022-01-15 ENCOUNTER — Telehealth: Payer: Self-pay | Admitting: Family Medicine

## 2022-01-15 ENCOUNTER — Ambulatory Visit (INDEPENDENT_AMBULATORY_CARE_PROVIDER_SITE_OTHER): Payer: BC Managed Care – PPO

## 2022-01-15 VITALS — BP 130/80 | HR 73 | Ht 64.0 in | Wt 254.0 lb

## 2022-01-15 DIAGNOSIS — G8929 Other chronic pain: Secondary | ICD-10-CM | POA: Diagnosis not present

## 2022-01-15 DIAGNOSIS — M5441 Lumbago with sciatica, right side: Secondary | ICD-10-CM

## 2022-01-15 DIAGNOSIS — M25559 Pain in unspecified hip: Secondary | ICD-10-CM

## 2022-01-15 DIAGNOSIS — M545 Low back pain, unspecified: Secondary | ICD-10-CM | POA: Diagnosis not present

## 2022-01-15 NOTE — Telephone Encounter (Signed)
Patient called after her visit with Dr Georgina Snell. She said that she had more questions regarding her hip pain.  She has been having continued hip pain that goes into the groin area.  Please advise.

## 2022-01-15 NOTE — Telephone Encounter (Signed)
I think asking your nephew to do some hip abductor strengthening and hip flexor stretching therapy would be a good idea.  If this is not sufficient we can try an injection into the hip.

## 2022-01-15 NOTE — Telephone Encounter (Signed)
Called pt and relayed Dr. Clovis Riley message regarding specific exercises for her hips.

## 2022-01-15 NOTE — Patient Instructions (Addendum)
Nice to meet you today.  Core stabilization exercises for her low back and cervical stab exercises for her neck pain.  Follow-up: as needed

## 2022-01-16 NOTE — Progress Notes (Signed)
Lumbar spine x-ray shows multilevel arthritis changes

## 2022-01-24 DIAGNOSIS — N952 Postmenopausal atrophic vaginitis: Secondary | ICD-10-CM | POA: Diagnosis not present

## 2022-02-04 ENCOUNTER — Encounter: Payer: Self-pay | Admitting: Cardiovascular Disease

## 2022-02-04 NOTE — Progress Notes (Signed)
Carolyn Mcguire Date of Birth  10/15/1956 [facility] [address]    [place], [place]  [address] [phone]  Fax  [phone]  Problem list: 1. Left bundle branch block 2. Hypothyroidism 3. Hyperlipidemia 4. Hypertension 5. Dyspnea on exertion      66 yo female with a hx of LBBB.  Stress myoview was negative.  She's been on a diet and exercise program and has lost about 6 pounds since last year.  She is drinking more water, avoiding fast foods.  She is walking regularly.  She has been diagnosed with migraines.  She thinks muscle tension may be contributing to her headaches.  Oct. 5, 2016:  Carolyn Mcguire is seen today for follow up of her shortness of breath Has had many deaths in her family and became depressed . Was started on Welbutrin for depression .  She was seen in the ER. Was told by the pharmacist at her primary medical doctors office thought it was due to the discontinuation of her Welbutrin. She is still not feeling 100% better so she was scheduled to see me .  She is still short of breath.    DOE  With walking up from the buildings in the back of her property.  She does not getting any regular exercise. She was bitten by a fox on July 11.  Had a series of rabies shots ( the fox was rabid)  Is retired .   She has had a stressful 2 years.    Several deaths in her family . Has not had the time or inclination  Wants to start a walking program.     No CP   February 21, 2016: Doing well Has lost some weight .   Has lost 9 lbs since I last saw her  Walks regularly   Sept. 7, 2017: Doing well BP is doing well at home. Still walking some  Has been to a nutritionist.   March 2 , 2108: Carolyn Mcguire is seen today .  Follow up for hypertension and hyper-hyperlipidemia, and hypothyroidism. BP is well controlled.   February 28, 2018: Carolyn Mcguire is seen today for follow-up of her hypertension and hyperlipidemia.  She has a left bundle branch block. BP has been ok.   A bit  hight today  Wt = 250  ( up 7 lbs from last year )  Has been eating out lots  .     January 06, 2019: Carolyn Mcguire is seen today for follow-up of her hypertension and hyperlipidemia.  She has a left bundle branch block that is old.  Weight today is 246 pounds which is down 3 pounds from her last visit.  BP is well controlled Has DOE from her weight  January 20, 2020:  Carolyn Mcguire is seen back today for history of hypertension and hyperlipidemia.  She has a left bundle branch block.  Weight today is 249 pounds which is up 3 pounds of her last visit. Had irregular episode of tachycardia yesterday  Lasted several hours Had 2 separate episodes  -she took her blood pressure which was elevated.  She found that her heart rate was irregular in the 120s.  Her heart rate monitor indicated that her heart rate was irregular. Has not been watching her diet.   Still eating salty foods most meals   Feb. 28, 2022: Carolyn Mcguire is seen today for follow up of her HTN, HLD, LBBB and palpitations. She wore a 30 day monitor which did not reveal any evidence  of atrial fib and did not reveal any significant arrhythmias.  Has been busy taking care of her aunt who was in a MVA.  Has not been eating well ( eating lots of pre-prepared foods, not exercising )  She has tried HCTZ in the past but she became too volume depleted and it was stopped.   No CP , dspnea, no syncope  February 05, 2022 Carolyn Mcguire is seen for follow up of her HTN, HLD,  LBBB  Current Outpatient Medications on File Prior to Visit  Medication Sig Dispense Refill   amLODipine (NORVASC) 5 MG tablet Take 2.5 mg by mouth daily.     Cholecalciferol (VITAMIN D3) 1000 units CAPS Take by mouth. One daily     ciclopirox (PENLAC) 8 % solution APPLY TOPICALLY TO AFFECTED TOE NAILS D     hydrochlorothiazide (HYDRODIURIL) 25 MG tablet Take 1 tablet (25 mg total) by mouth daily. 90 tablet 3   hydrocortisone (ANUSOL-HC) 25 MG suppository Place 1 suppository (25 mg total)  rectally at bedtime as needed for hemorrhoids or anal itching. 6 suppository 3   levothyroxine (SYNTHROID, LEVOTHROID) 88 MCG tablet Take 88 mcg by mouth daily before breakfast.     losartan (COZAAR) 100 MG tablet Take 100 mg by mouth daily. as directed  3   metoprolol succinate (TOPROL-XL) 50 MG 24 hr tablet Take 1 tablet (50 mg total) by mouth daily. 90 tablet 3   potassium chloride (KLOR-CON) 10 MEQ tablet Take 1 tablet (10 mEq total) by mouth daily. (Patient not taking: Reported on 12/06/2021) 90 tablet 3   rizatriptan (MAXALT-MLT) 10 MG disintegrating tablet Take 1 tablet (10 mg total) by mouth as needed for migraine. May repeat in 2 hours if needed 10 tablet 11   rosuvastatin (CRESTOR) 10 MG tablet Take 10 mg by mouth daily.     No current facility-administered medications on file prior to visit.    Allergies  Allergen Reactions   Elemental Sulfur Rash    MAKES PT RED    Past Medical History:  Diagnosis Date   Adenomatous colon polyp    Allergic rhinitis    Animal bite 05/2015   fox    Anxiety    Arthritis    shoulder   Carpal tunnel syndrome    RIGHT HAND   Depression    Diastolic dysfunction    a. 2D echo 09/08/15: EF 55-60%, grade 1 DD, elevated LVEDP, mild MR, mild TR. - normal BNP 08/2015.   Essential hypertension    GERD (gastroesophageal reflux disease)    Headache    Heart murmur    Hepatic steatosis    Hyperlipidemia    Hypothyroidism    LBBB (left bundle branch block)    a. negative nuclear stress test 06/2011.   Migraines    Obesity    OSA (obstructive sleep apnea)    mild    Past Surgical History:  Procedure Laterality Date   DERMOID CYST  EXCISION     OF HER RIGHT OVARY. SIZE OF A GRAPEFRUIT. OVARY IS STILL THERE.   HEMITHYROIDECTOMY     RUPTURE BELLY BUTTON     WHEN SHE WAS AN INFANT   TONSILECTOMY, ADENOIDECTOMY, BILATERAL MYRINGOTOMY AND TUBES     AGE 23, pt states she didn't have tubes put into her ears    Social History   Tobacco Use   Smoking Status Never  Smokeless Tobacco Never    Social History   Substance and Sexual Activity  Alcohol Use No  Family History  Problem Relation Age of Onset   Neuropathy Mother    Parkinsonism Mother    Ovarian cancer Mother        dx. 81-59; TAH-BSO at 64   Diabetes Mother    Pancreatic cancer Mother 1       d. 47   Skin cancer Mother        maybe basal cell carcinoma   Obesity Mother    Heart disease Father    Bladder Cancer Father        dx 41-81   Skin cancer Father        possibly melanoma; +sun exposure   Cancer Brother 78       salivary gland cancer; d. 62; no tobacco use   Breast cancer Maternal Aunt        dx unspecified age; s/p mastectomy   Other Maternal Aunt        intellectual disabilities   Heart failure Maternal Aunt 86       acute diastolic heart failure   Colon cancer Maternal Uncle        dx late 109s; d. Carolyn Mcguire 70s   Heart attack Maternal Uncle 51   Cancer Paternal Uncle        CLL, dx. 56   Ovarian cancer Maternal Grandmother        d. 39; w/ mets   Heart attack Maternal Grandfather        d. 71   Esophageal cancer Neg Hx    Rectal cancer Neg Hx    Stomach cancer Neg Hx     Reviw of Systems:  Noted in current history, all other systems are negative.Marland Kitchen   Physical Exam: There were no vitals taken for this visit.  GEN:  Well nourished, well developed in no acute distress HEENT: Normal NECK: No JVD; No carotid bruits LYMPHATICS: No lymphadenopathy CARDIAC: RRR, no murmurs, rubs, gallops RESPIRATORY:  Clear to auscultation without rales, wheezing or rhonchi  ABDOMEN: Soft, non-tender, non-distended MUSCULOSKELETAL:  No edema; No deformity  SKIN: Warm and dry NEUROLOGIC:  Alert and oriented x 3       ECG:   .  Assessment / Plan:   1. Left bundle branch block-        2. Hypothyroidism-       3. Hyperlipidemia - -managed by her primary medical doctor  4. Hypertension -    5.  Palpitations:      5. Dyspnea on  exertion-     7.  Morbid obesity:        Mertie Moores, MD  02/04/2022 9:56 PM    [address] [place],  [place], [place]  [address] Pager [phone] Phone: [phone]; Fax: [phone]

## 2022-02-05 ENCOUNTER — Encounter: Payer: Self-pay | Admitting: Cardiovascular Disease

## 2022-02-05 ENCOUNTER — Other Ambulatory Visit: Payer: Self-pay

## 2022-02-05 ENCOUNTER — Ambulatory Visit: Payer: BC Managed Care – PPO | Admitting: Cardiovascular Disease

## 2022-02-05 VITALS — BP 138/72 | HR 68 | Ht 64.5 in | Wt 253.2 lb

## 2022-02-05 DIAGNOSIS — I447 Left bundle-branch block, unspecified: Secondary | ICD-10-CM | POA: Diagnosis not present

## 2022-02-05 DIAGNOSIS — I5189 Other ill-defined heart diseases: Secondary | ICD-10-CM | POA: Diagnosis not present

## 2022-02-05 DIAGNOSIS — I10 Essential (primary) hypertension: Secondary | ICD-10-CM

## 2022-02-05 NOTE — Patient Instructions (Signed)
Medication Instructions:  ?Your physician recommends that you continue on your current medications as directed. Please refer to the Current Medication list given to you today. ? ?*If you need a refill on your cardiac medications before your next appointment, please call your pharmacy* ? ?Lab Work: ?NONE ?If you have labs (blood work) drawn today and your tests are completely normal, you will receive your results only by: ?MyChart Message (if you have MyChart) OR ?A paper copy in the mail ?If you have any lab test that is abnormal or we need to change your treatment, we will call you to review the results. ? ?Testing/Procedures: ?Your physician has requested that you have an echocardiogram 1 week prior to office visit March 2024. Echocardiography is a painless test that uses sound waves to create images of your heart. It provides your doctor with information about the size and shape of your heart and how well your heart?s chambers and valves are working. This procedure takes approximately one hour. There are no restrictions for this procedure. ? ?Follow-Up: ?At [facility], you and your health needs are our priority.  As part of our continuing mission to provide you with exceptional heart care, we have created designated Provider Care Teams.  These Care Teams include your primary Cardiologist (physician) and Advanced Practice Providers (APPs -  Physician Assistants and Nurse Practitioners) who all work together to provide you with the care you need, when you need it. ? ?We recommend signing up for the patient portal called "MyChart".  Sign up information is provided on this After Visit Summary.  MyChart is used to connect with patients for Virtual Visits (Telemedicine).  Patients are able to view lab/test results, encounter notes, upcoming appointments, etc.  Non-urgent messages can be sent to your provider as well.   ?To learn more about what you can do with MyChart, go to [email or web address].   ? ?Your next  appointment:   ?1 year(s) ? ?The format for your next appointment:   ?In Person ? ?Provider:   ?Robbie Lis, PA-C, Christen Bame, NP, or Richardson Dopp, PA-C ?

## 2022-02-05 NOTE — Progress Notes (Signed)
Carolyn Mcguire Date of Birth  Oct 20, 1956 Carolyn Mcguire [address]    [place], [place]  [address] [phone]  Fax  [phone]  Problem list: 1. Left bundle branch block 2. Hypothyroidism 3. Hyperlipidemia 4. Hypertension 5. Dyspnea on exertion      66 yo female with a hx of LBBB.  Stress myoview was negative.  She's been on a diet and exercise program and has lost about 6 pounds since last year.  She is drinking more water, avoiding fast foods.  She is walking regularly.  She has been diagnosed with migraines.  She thinks muscle tension may be contributing to her headaches.  Oct. 5, 2016:  Carolyn Mcguire is seen today for follow up of her shortness of breath Has had many deaths in her family and became depressed . Was started on Welbutrin for depression .  She was seen in the ER. Was told by the pharmacist at her primary medical doctors office thought it was due to the discontinuation of her Welbutrin. She is still not feeling 100% better so she was scheduled to see me .  She is still short of breath.    DOE  With walking up from the buildings in the back of her property.  She does not getting any regular exercise. She was bitten by a fox on July 11.  Had a series of rabies shots ( the fox was rabid)  Is retired .   She has had a stressful 2 years.    Several deaths in her family . Has not had the time or inclination  Wants to start a walking program.     No CP   February 21, 2016: Doing well Has lost some weight .   Has lost 9 lbs since I last saw her  Walks regularly   Sept. 7, 2017: Doing well BP is doing well at home. Still walking some  Has been to a nutritionist.   March 2 , 2108: Carolyn Mcguire is seen today .  Follow up for hypertension and hyper-hyperlipidemia, and hypothyroidism. BP is well controlled.   February 28, 2018: [name] is seen today for follow-up of her hypertension and hyperlipidemia.  She has a left bundle branch block. BP has been ok.   A bit  hight today  Wt = 250  ( up 7 lbs from last year )  Has been eating out lots  .     January 06, 2019: Carolyn Mcguire is seen today for follow-up of her hypertension and hyperlipidemia.  She has a left bundle branch block that is old.  Weight today is 246 pounds which is down 3 pounds from her last visit.  BP is well controlled Has DOE from her weight  January 20, 2020:  Carolyn Mcguire is seen back today for history of hypertension and hyperlipidemia.  She has a left bundle branch block.  Weight today is 249 pounds which is up 3 pounds of her last visit. Had irregular episode of tachycardia yesterday  Lasted several hours Had 2 separate episodes  -she took her blood pressure which was elevated.  She found that her heart rate was irregular in the 120s.  Her heart rate monitor indicated that her heart rate was irregular. Has not been watching her diet.   Still eating salty foods most meals   Feb. 28, 2022: Carolyn Mcguire is seen today for follow up of her HTN, HLD, LBBB and palpitations. She wore a 30 day monitor which did not reveal any evidence  of atrial fib and did not reveal any significant arrhythmias.  Has been busy taking care of her aunt who was in a MVA.  Has not been eating well ( eating lots of pre-prepared foods, not exercising )  She has tried HCTZ in the past but she became too volume depleted and it was stopped.   No CP , dspnea, no syncope  February 05, 2022 Carolyn Mcguire is seen for follow up of her HTN, HLD,  LBBB Some chest discomfort She has been lifting lots of heavy containers and thinks she might of strained her chest.  She also thinks it might be due to indigestion and is been taking an antacid.  She seems to be getting along better now  No additional salt .   Is walking some   Her last echocardiogram was performed in April, 2021.  It shows normal left ventricular systolic function with grade 1 diastolic dysfunction.  Current Outpatient Medications on File Prior to Visit  Medication Sig  Dispense Refill   amLODipine (NORVASC) 5 MG tablet Take 2.5 mg by mouth daily.     Cholecalciferol (VITAMIN D3) 1000 units CAPS Take by mouth. One daily     ciclopirox (PENLAC) 8 % solution APPLY TOPICALLY TO AFFECTED TOE NAILS D     esomeprazole (NEXIUM) 20 MG capsule Take 20 mg by mouth daily at 12 noon.     levothyroxine (SYNTHROID, LEVOTHROID) 88 MCG tablet Take 88 mcg by mouth daily before breakfast.     losartan (COZAAR) 100 MG tablet Take 100 mg by mouth daily. as directed  3   metoprolol succinate (TOPROL-XL) 50 MG 24 hr tablet Take 1 tablet (50 mg total) by mouth daily. 90 tablet 3   rizatriptan (MAXALT-MLT) 10 MG disintegrating tablet Take 1 tablet (10 mg total) by mouth as needed for migraine. May repeat in 2 hours if needed 10 tablet 11   rosuvastatin (CRESTOR) 10 MG tablet Take 10 mg by mouth daily.     hydrochlorothiazide (HYDRODIURIL) 25 MG tablet Take 1 tablet (25 mg total) by mouth daily. (Patient not taking: Reported on 02/05/2022) 90 tablet 3   hydrocortisone (ANUSOL-HC) 25 MG suppository Place 1 suppository (25 mg total) rectally at bedtime as needed for hemorrhoids or anal itching. (Patient not taking: Reported on 02/05/2022) 6 suppository 3   potassium chloride (KLOR-CON) 10 MEQ tablet Take 1 tablet (10 mEq total) by mouth daily. (Patient not taking: Reported on 12/06/2021) 90 tablet 3   No current facility-administered medications on file prior to visit.    Allergies  Allergen Reactions   Elemental Sulfur Rash    MAKES PT RED    Past Medical History:  Diagnosis Date   Adenomatous colon polyp    Allergic rhinitis    Animal bite 05/2015   fox    Anxiety    Arthritis    shoulder   Carpal tunnel syndrome    RIGHT HAND   Depression    Diastolic dysfunction    a. 2D echo 09/08/15: EF 55-60%, grade 1 DD, elevated LVEDP, mild MR, mild TR. - normal BNP 08/2015.   Essential hypertension    GERD (gastroesophageal reflux disease)    Headache    Heart murmur    Hepatic  steatosis    Hyperlipidemia    Hypothyroidism    LBBB (left bundle branch block)    a. negative nuclear stress test 06/2011.   Migraines    Obesity    OSA (obstructive sleep apnea)    mild  Past Surgical History:  Procedure Laterality Date   DERMOID CYST  EXCISION     OF HER RIGHT OVARY. SIZE OF A GRAPEFRUIT. OVARY IS STILL THERE.   HEMITHYROIDECTOMY     RUPTURE BELLY BUTTON     WHEN SHE WAS AN INFANT   TONSILECTOMY, ADENOIDECTOMY, BILATERAL MYRINGOTOMY AND TUBES     AGE 6, pt states she didn't have tubes put into her ears    Social History   Tobacco Use  Smoking Status Never  Smokeless Tobacco Never    Social History   Substance and Sexual Activity  Alcohol Use No    Family History  Problem Relation Age of Onset   Neuropathy Mother    Parkinsonism Mother    Ovarian cancer Mother        dx. 14-59; TAH-BSO at 31   Diabetes Mother    Pancreatic cancer Mother 37       d. 84   Skin cancer Mother        maybe basal cell carcinoma   Obesity Mother    Heart disease Father    Bladder Cancer Father        dx 49-81   Skin cancer Father        possibly melanoma; +sun exposure   Cancer Brother 56       salivary gland cancer; d. 69; no tobacco use   Breast cancer Maternal Aunt        dx unspecified age; s/p mastectomy   Other Maternal Aunt        intellectual disabilities   Heart failure Maternal Aunt 86       acute diastolic heart failure   Colon cancer Maternal Uncle        dx late 25s; d. early 70s   Heart attack Maternal Uncle 69   Cancer Paternal Uncle        CLL, dx. 60   Ovarian cancer Maternal Grandmother        d. 82; w/ mets   Heart attack Maternal Grandfather        d. 72   Esophageal cancer Neg Hx    Rectal cancer Neg Hx    Stomach cancer Neg Hx     Reviw of Systems:  Noted in current history, all other systems are negative.Marland Kitchen   Physical Exam: Blood pressure 138/72, pulse 68, height 5' 4.5" (1.638 m), weight 253 lb 3.2 oz (114.9 kg), SpO2  99 %.  GEN:  Well nourished, well developed in no acute distress HEENT: Normal NECK: No JVD; No carotid bruits LYMPHATICS: No lymphadenopathy CARDIAC: RRR , no murmurs, rubs, gallops RESPIRATORY:  Clear to auscultation without rales, wheezing or rhonchi  ABDOMEN: Soft, non-tender, non-distended MUSCULOSKELETAL:  No edema; No deformity  SKIN: Warm and dry NEUROLOGIC:  Alert and oriented x 3   ECG: February 05, 2022: Normal sinus rhythm at 70.  Left bundle branch block.  .  Assessment / Plan:   1. Left bundle branch block-      her left ventricular systolic function remains normal.  She does have grade 1 diastolic dysfunction. We will recheck her LV function with echo in 1 year.  2. Hypothyroidism-       3. Hyperlipidemia - -managed by her primary medical doctor continue current medications.   4. Hypertension - Blood pressure is well controlled.   5.  Palpitations:      5. Dyspnea on exertion-     7.  Morbid obesity:   advised weight  loss   Follow up with Christen Bame, NP. Richardson Dopp or [place], [place]    Mertie Moores, MD  02/05/2022 4:01 PM    [facility] [place],  [place], [place]  [address] Pager [phone] Phone: [phone]; Fax: [phone]

## 2022-02-13 DIAGNOSIS — I10 Essential (primary) hypertension: Secondary | ICD-10-CM | POA: Diagnosis not present

## 2022-02-13 DIAGNOSIS — E785 Hyperlipidemia, unspecified: Secondary | ICD-10-CM | POA: Diagnosis not present

## 2022-02-13 DIAGNOSIS — E039 Hypothyroidism, unspecified: Secondary | ICD-10-CM | POA: Diagnosis not present

## 2022-02-28 DIAGNOSIS — Z124 Encounter for screening for malignant neoplasm of cervix: Secondary | ICD-10-CM | POA: Diagnosis not present

## 2022-02-28 DIAGNOSIS — Z01419 Encounter for gynecological examination (general) (routine) without abnormal findings: Secondary | ICD-10-CM | POA: Diagnosis not present

## 2022-02-28 DIAGNOSIS — Z113 Encounter for screening for infections with a predominantly sexual mode of transmission: Secondary | ICD-10-CM | POA: Diagnosis not present

## 2022-02-28 DIAGNOSIS — Z6841 Body Mass Index (BMI) 40.0 and over, adult: Secondary | ICD-10-CM | POA: Diagnosis not present

## 2022-02-28 DIAGNOSIS — Z0142 Encounter for cervical smear to confirm findings of recent normal smear following initial abnormal smear: Secondary | ICD-10-CM | POA: Diagnosis not present

## 2022-02-28 DIAGNOSIS — Z01411 Encounter for gynecological examination (general) (routine) with abnormal findings: Secondary | ICD-10-CM | POA: Diagnosis not present

## 2022-05-22 DIAGNOSIS — T161XXA Foreign body in right ear, initial encounter: Secondary | ICD-10-CM | POA: Diagnosis not present

## 2022-05-22 DIAGNOSIS — G253 Myoclonus: Secondary | ICD-10-CM | POA: Diagnosis not present

## 2022-05-22 DIAGNOSIS — M26623 Arthralgia of bilateral temporomandibular joint: Secondary | ICD-10-CM | POA: Diagnosis not present

## 2022-05-22 DIAGNOSIS — H9203 Otalgia, bilateral: Secondary | ICD-10-CM | POA: Diagnosis not present

## 2022-06-05 DIAGNOSIS — N898 Other specified noninflammatory disorders of vagina: Secondary | ICD-10-CM | POA: Diagnosis not present

## 2022-06-14 ENCOUNTER — Ambulatory Visit: Payer: BC Managed Care – PPO | Admitting: Neurology

## 2022-06-19 DIAGNOSIS — B351 Tinea unguium: Secondary | ICD-10-CM | POA: Diagnosis not present

## 2022-06-19 DIAGNOSIS — L570 Actinic keratosis: Secondary | ICD-10-CM | POA: Diagnosis not present

## 2022-06-19 DIAGNOSIS — L718 Other rosacea: Secondary | ICD-10-CM | POA: Diagnosis not present

## 2022-07-04 ENCOUNTER — Ambulatory Visit (INDEPENDENT_AMBULATORY_CARE_PROVIDER_SITE_OTHER): Payer: BC Managed Care – PPO | Admitting: Bariatrics

## 2022-07-12 DIAGNOSIS — D72829 Elevated white blood cell count, unspecified: Secondary | ICD-10-CM | POA: Diagnosis not present

## 2022-07-12 DIAGNOSIS — R1030 Lower abdominal pain, unspecified: Secondary | ICD-10-CM | POA: Diagnosis not present

## 2022-07-12 DIAGNOSIS — K5732 Diverticulitis of large intestine without perforation or abscess without bleeding: Secondary | ICD-10-CM | POA: Diagnosis not present

## 2022-07-12 DIAGNOSIS — R102 Pelvic and perineal pain: Secondary | ICD-10-CM | POA: Diagnosis not present

## 2022-07-12 DIAGNOSIS — R350 Frequency of micturition: Secondary | ICD-10-CM | POA: Diagnosis not present

## 2022-07-13 DIAGNOSIS — D7389 Other diseases of spleen: Secondary | ICD-10-CM | POA: Diagnosis not present

## 2022-07-13 DIAGNOSIS — K5732 Diverticulitis of large intestine without perforation or abscess without bleeding: Secondary | ICD-10-CM | POA: Diagnosis not present

## 2022-07-13 DIAGNOSIS — D72829 Elevated white blood cell count, unspecified: Secondary | ICD-10-CM | POA: Diagnosis not present

## 2022-07-18 ENCOUNTER — Ambulatory Visit (INDEPENDENT_AMBULATORY_CARE_PROVIDER_SITE_OTHER): Payer: BC Managed Care – PPO | Admitting: Bariatrics

## 2022-07-20 DIAGNOSIS — K5732 Diverticulitis of large intestine without perforation or abscess without bleeding: Secondary | ICD-10-CM | POA: Diagnosis not present

## 2022-07-20 DIAGNOSIS — D7389 Other diseases of spleen: Secondary | ICD-10-CM | POA: Diagnosis not present

## 2022-07-24 DIAGNOSIS — D739 Disease of spleen, unspecified: Secondary | ICD-10-CM | POA: Diagnosis not present

## 2022-07-31 DIAGNOSIS — Z8719 Personal history of other diseases of the digestive system: Secondary | ICD-10-CM | POA: Diagnosis not present

## 2022-07-31 DIAGNOSIS — R1030 Lower abdominal pain, unspecified: Secondary | ICD-10-CM | POA: Diagnosis not present

## 2022-08-08 DIAGNOSIS — N83209 Unspecified ovarian cyst, unspecified side: Secondary | ICD-10-CM | POA: Diagnosis not present

## 2022-08-08 DIAGNOSIS — Z8041 Family history of malignant neoplasm of ovary: Secondary | ICD-10-CM | POA: Diagnosis not present

## 2022-08-13 DIAGNOSIS — Z1231 Encounter for screening mammogram for malignant neoplasm of breast: Secondary | ICD-10-CM | POA: Diagnosis not present

## 2022-08-14 IMAGING — US US THYROID
1 series · 13 of 25 positions shown · non-contrast
Comparison: 10/07/2019

CLINICAL DATA: Remote right thyroidectomy, left thyroid nodule

EXAM:
THYROID ULTRASOUND
TECHNIQUE: Ultrasound examination of the thyroid gland and adjacent soft
tissues was performed.

[Series 1: us thyroid · 0.07mm/px · 13 of 36 slices shown]
[im 1/36]
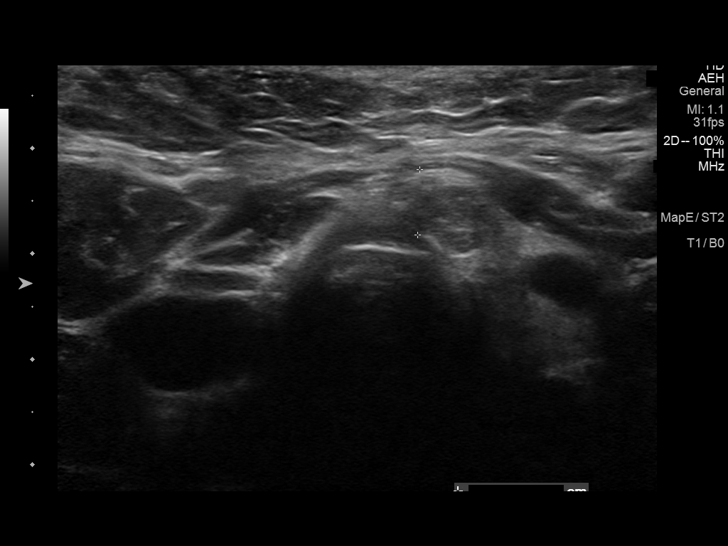
[im 3/36]
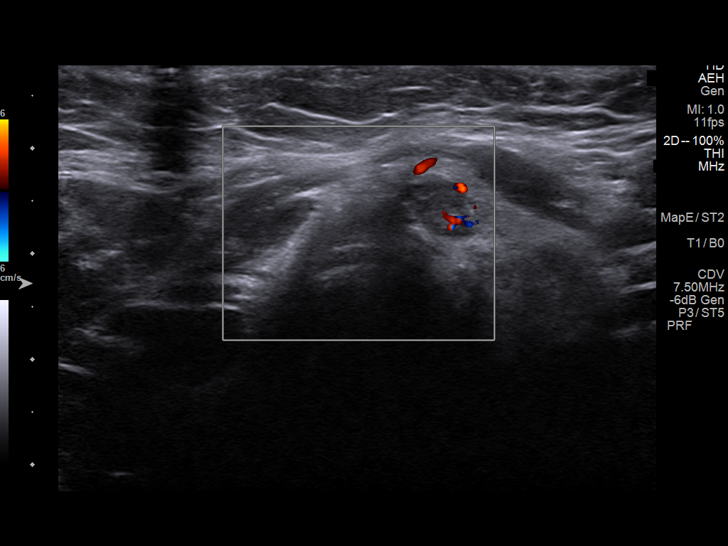
[im 6/36]
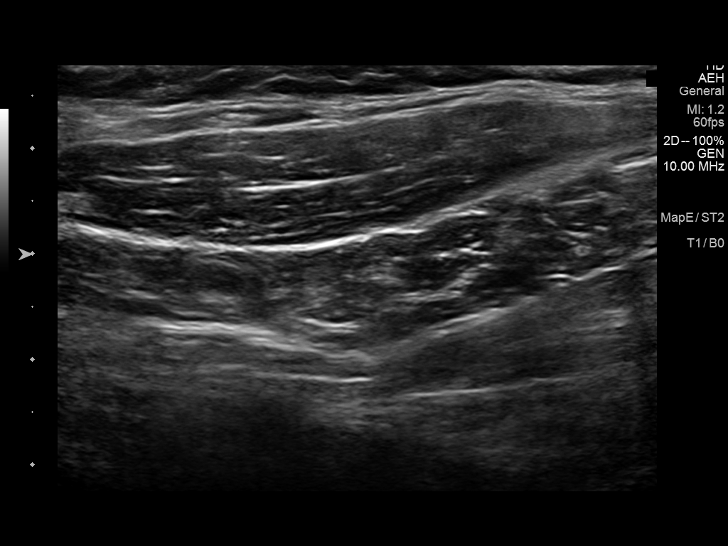
[im 9/36]
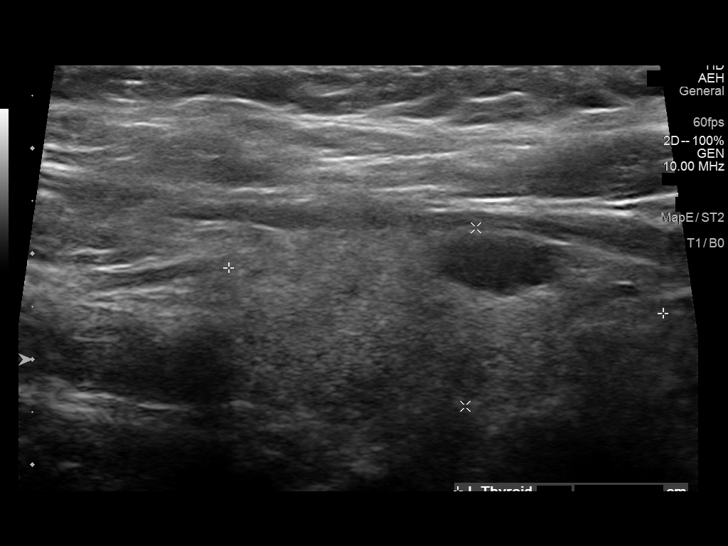
[im 12/36]
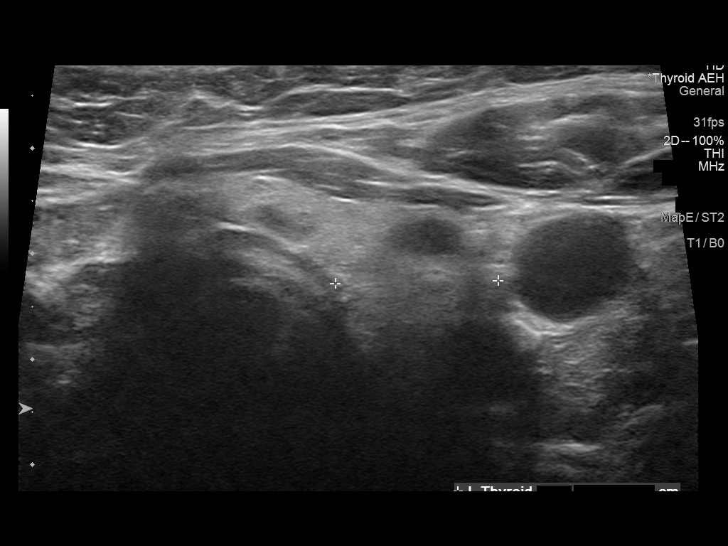
[im 15/36]
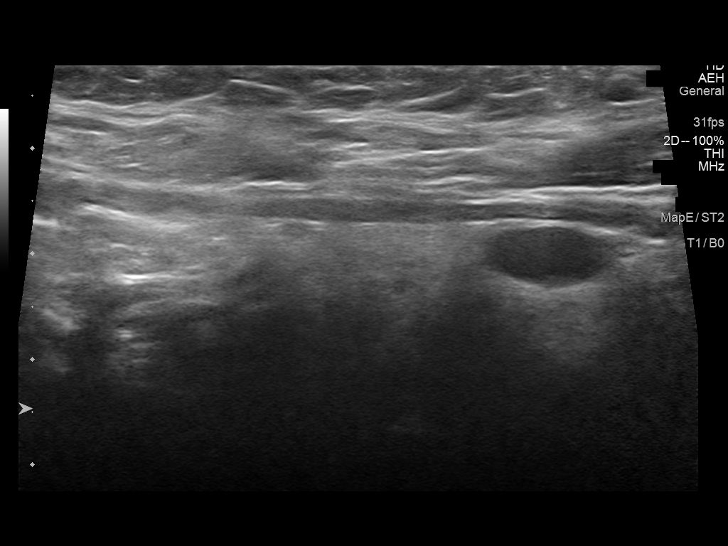
[im 18/36]
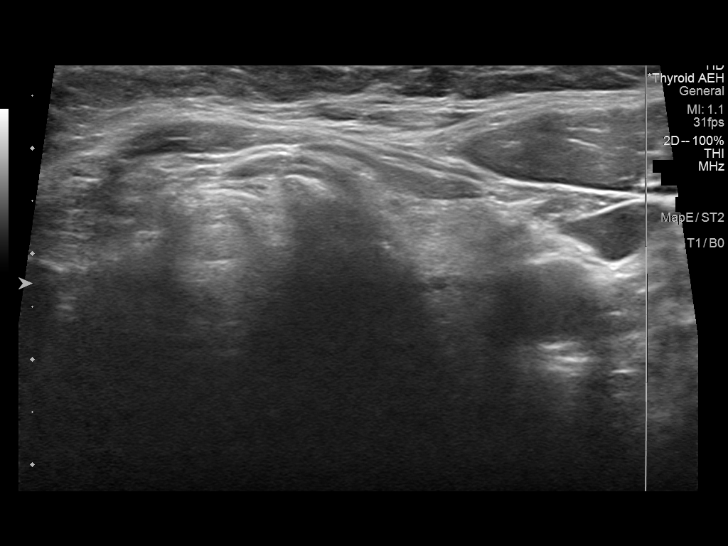
[im 21/36]
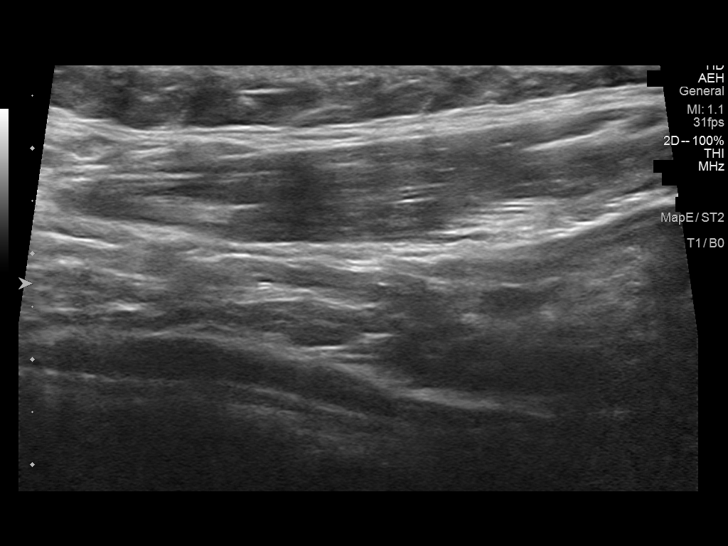
[im 24/36]
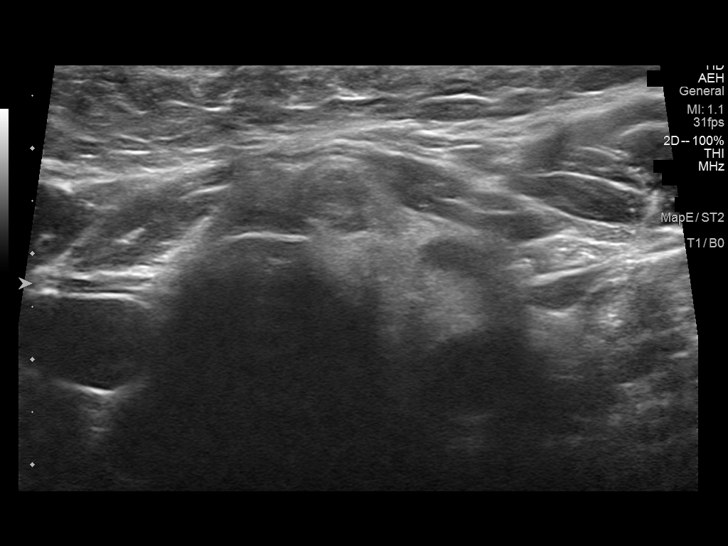
[im 27/36]
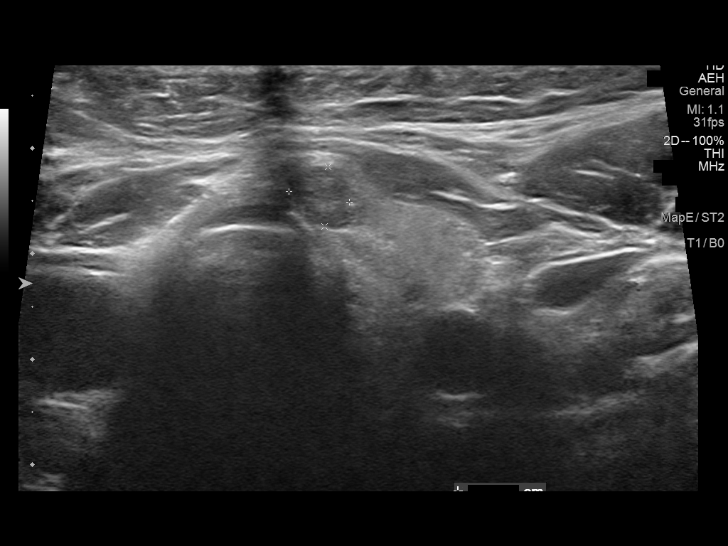
[im 30/36]
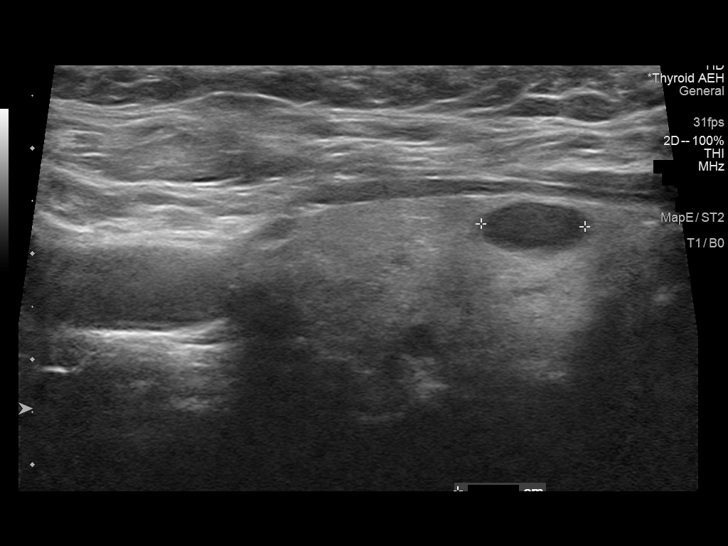
[im 33/36]
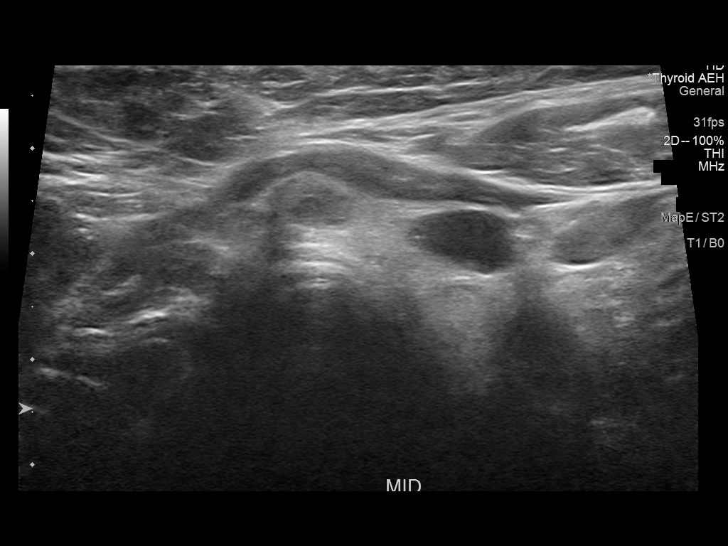
[im 36/36]
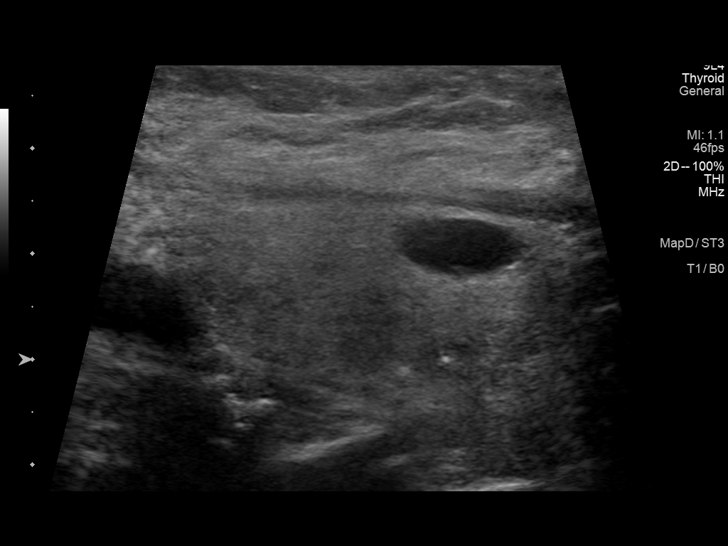

[13 of 25 positions shown; findings below may reference images not displayed]

FINDINGS: Parenchymal Echotexture: Moderately heterogenous

Isthmus: 6 mm

Right lobe: Surgically absent

Left lobe: 4.1 x 1.7 x 1.5 cm

_________________________________________________________

Estimated total number of nodules >/= 1 cm: 1

Number of spongiform nodules >/=  2 cm not described below (TR1): 0

Number of mixed cystic and solid nodules >/= 1.5 cm not described
below (TR2): 0

_________________________________________________________

Nodule # 1:

Location: Left; Mid

Maximum size: 1.0, unchanged cm; Other 2 dimensions: 1.0 x 0.6 cm

Composition: solid/almost completely solid (2)

Echogenicity: hypoechoic (2)

Shape: not taller-than-wide (0)

Margins: smooth (0)

Echogenic foci: none (0)

ACR TI-RADS total points: 4.

ACR TI-RADS risk category: TR4 (4-6 points).

ACR TI-RADS recommendations:

*Given size (>/= 1 - 1.4 cm) and appearance, a follow-up ultrasound
in 1 year should be considered based on TI-RADS criteria.

_________________________________________________________

Additional subcentimeter isoechoic and hypoechoic nodules noted in
the residual isthmus and left thyroid. These all measure 7 mm or
less in size and would not meet criteria for any follow-up biopsy.

Remote right thyroidectomy. No regional adenopathy. No
hypervascularity.
IMPRESSION: Stable 1 cm left mid thyroid TR 4 nodule meets criteria for
continued follow-up in 1 year.

Remote right thyroidectomy.

The above is in keeping with the ACR TI-RADS recommendations - [HOSPITAL] 3882;[DATE].

## 2022-08-20 DIAGNOSIS — H04123 Dry eye syndrome of bilateral lacrimal glands: Secondary | ICD-10-CM | POA: Diagnosis not present

## 2022-08-20 DIAGNOSIS — H2513 Age-related nuclear cataract, bilateral: Secondary | ICD-10-CM | POA: Diagnosis not present

## 2022-08-22 ENCOUNTER — Encounter: Payer: Self-pay | Admitting: Internal Medicine

## 2022-09-12 ENCOUNTER — Emergency Department (HOSPITAL_COMMUNITY)
Admission: EM | Admit: 2022-09-12 | Discharge: 2022-09-12 | Disposition: A | Discharge status: Home / Self Care | Payer: BC Managed Care – PPO | Attending: Emergency Medicine | Admitting: Emergency Medicine

## 2022-09-12 ENCOUNTER — Emergency Department (HOSPITAL_COMMUNITY): Payer: BC Managed Care – PPO

## 2022-09-12 ENCOUNTER — Encounter (HOSPITAL_COMMUNITY): Payer: Self-pay

## 2022-09-12 ENCOUNTER — Other Ambulatory Visit: Payer: Self-pay

## 2022-09-12 DIAGNOSIS — I2510 Atherosclerotic heart disease of native coronary artery without angina pectoris: Secondary | ICD-10-CM | POA: Diagnosis not present

## 2022-09-12 DIAGNOSIS — Z79899 Other long term (current) drug therapy: Secondary | ICD-10-CM | POA: Insufficient documentation

## 2022-09-12 DIAGNOSIS — M545 Low back pain, unspecified: Secondary | ICD-10-CM | POA: Insufficient documentation

## 2022-09-12 DIAGNOSIS — M5136 Other intervertebral disc degeneration, lumbar region: Secondary | ICD-10-CM | POA: Diagnosis not present

## 2022-09-12 DIAGNOSIS — I700 Atherosclerosis of aorta: Secondary | ICD-10-CM | POA: Diagnosis not present

## 2022-09-12 DIAGNOSIS — R110 Nausea: Secondary | ICD-10-CM | POA: Diagnosis not present

## 2022-09-12 LAB — CBC WITH DIFFERENTIAL/PLATELET
Abs Immature Granulocytes: 0.03 10*3/uL (ref 0.00–0.07)
Basophils Absolute: 0 10*3/uL (ref 0.0–0.1)
Basophils Relative: 0 %
Eosinophils Absolute: 0.1 10*3/uL (ref 0.0–0.5)
Eosinophils Relative: 1 %
HCT: 42.8 % (ref 36.0–46.0)
Hemoglobin: 14.2 g/dL (ref 12.0–15.0)
Immature Granulocytes: 0 %
Lymphocytes Relative: 15 %
Lymphs Abs: 1.2 10*3/uL (ref 0.7–4.0)
MCH: 29.3 pg (ref 26.0–34.0)
MCHC: 33.2 g/dL (ref 30.0–36.0)
MCV: 88.4 fL (ref 80.0–100.0)
Monocytes Absolute: 0.4 10*3/uL (ref 0.1–1.0)
Monocytes Relative: 5 %
Neutro Abs: 6.3 10*3/uL (ref 1.7–7.7)
Neutrophils Relative %: 79 %
Platelets: 242 10*3/uL (ref 150–400)
RBC: 4.84 MIL/uL (ref 3.87–5.11)
RDW: 12.4 % (ref 11.5–15.5)
WBC: 8.1 10*3/uL (ref 4.0–10.5)
nRBC: 0 % (ref 0.0–0.2)

## 2022-09-12 LAB — BASIC METABOLIC PANEL
Anion gap: 10 (ref 5–15)
BUN: 16 mg/dL (ref 8–23)
CO2: 25 mmol/L (ref 22–32)
Calcium: 9.2 mg/dL (ref 8.9–10.3)
Chloride: 106 mmol/L (ref 98–111)
Creatinine, Ser: 0.73 mg/dL (ref 0.44–1.00)
GFR, Estimated: >60 mL/min (ref >60)
Glucose, Bld: 131 mg/dL — ABNORMAL HIGH (ref 70–99)
Potassium: 3.8 mmol/L (ref 3.5–5.1)
Sodium: 141 mmol/L (ref 135–145)

## 2022-09-12 LAB — URINALYSIS, ROUTINE W REFLEX MICROSCOPIC
Bilirubin Urine: NEGATIVE
Glucose, UA: NEGATIVE mg/dL
Hgb urine dipstick: NEGATIVE
Ketones, ur: NEGATIVE mg/dL
Leukocytes,Ua: NEGATIVE
Nitrite: NEGATIVE
Protein, ur: NEGATIVE mg/dL
Specific Gravity, Urine: 1.012 (ref 1.005–1.030)
pH: 6 (ref 5.0–8.0)

## 2022-09-12 LAB — HEPATIC FUNCTION PANEL
ALT: 22 U/L (ref 0–44)
AST: 29 U/L (ref 15–41)
Albumin: 4.3 g/dL (ref 3.5–5.0)
Alkaline Phosphatase: 74 U/L (ref 38–126)
Bilirubin, Direct: 0.2 mg/dL (ref 0.0–0.2)
Indirect Bilirubin: 1.1 mg/dL — ABNORMAL HIGH (ref 0.3–0.9)
Total Bilirubin: 1.3 mg/dL — ABNORMAL HIGH (ref 0.3–1.2)
Total Protein: 7.7 g/dL (ref 6.5–8.1)

## 2022-09-12 LAB — LIPASE, BLOOD: Lipase: 27 U/L (ref 11–51)

## 2022-09-12 MED ORDER — METHOCARBAMOL 500 MG PO TABS
500.0000 mg | ORAL_TABLET | Freq: Once | ORAL | Status: AC
  Administered 2022-09-12: 500 mg via ORAL
  Filled 2022-09-12: qty 1

## 2022-09-12 MED ORDER — IOHEXOL 350 MG/ML SOLN
100.0000 mL | Freq: Once | INTRAVENOUS | Status: AC | PRN
  Administered 2022-09-12: 100 mL via INTRAVENOUS

## 2022-09-12 MED ORDER — SODIUM CHLORIDE (PF) 0.9 % IJ SOLN
INTRAMUSCULAR | Status: AC
  Filled 2022-09-12: qty 50

## 2022-09-12 MED ORDER — NAPROXEN 500 MG PO TABS: 500.0000 mg | ORAL_TABLET | Freq: Two times a day (BID) | ORAL | 0 refills | Status: DC | PRN

## 2022-09-12 MED ORDER — HYDROCODONE-ACETAMINOPHEN 5-325 MG PO TABS
1.0000 | ORAL_TABLET | Freq: Once | ORAL | Status: AC
  Administered 2022-09-12: 1 via ORAL
  Filled 2022-09-12: qty 1

## 2022-09-12 MED ORDER — ONDANSETRON 4 MG PO TBDP
4.0000 mg | ORAL_TABLET | Freq: Once | ORAL | Status: AC
  Administered 2022-09-12: 4 mg via ORAL
  Filled 2022-09-12: qty 1

## 2022-09-12 MED ORDER — LOSARTAN POTASSIUM 25 MG PO TABS
100.0000 mg | ORAL_TABLET | Freq: Once | ORAL | Status: AC
  Administered 2022-09-12: 100 mg via ORAL
  Filled 2022-09-12: qty 4

## 2022-09-12 MED ORDER — METHOCARBAMOL 500 MG PO TABS: 500.0000 mg | ORAL_TABLET | Freq: Three times a day (TID) | ORAL | 0 refills | Status: DC | PRN

## 2022-09-12 NOTE — ED Triage Notes (Addendum)
Patient has right back pain that started 2 days ago. Has been nauseous, not been able to eat. Pain worsens when she walks or stands. Had a yard sale over the weekend and did heavy lifting. Patient also did not take her blood pressure medication this morning.

## 2022-09-12 NOTE — Discharge Instructions (Signed)
Your testing is reassuring.  There is no evidence of kidney stone or infection in the urine.  Take the anti-inflammatories and muscle relaxants as prescribed. Return to the ED with worsening pain, weakness, numbness, tingling, bowel or bladder incontinence, or any other concerns.

## 2022-09-12 NOTE — ED Provider Notes (Signed)
[facility] DEPT Provider Note   CSN: [id number] Arrival date & time: 09/12/22  8469     History  Chief Complaint  Patient presents with   Back Pain    Carolyn Mcguire is a 66 y.o. female.  Patient presents with right-sided low back pain that onset 2 days ago.  Has been waxing and waning in severity but never gone away completely.  Taking Tylenol without relief.  Denies any fall or injury.  States she did have a yard sale last week but did not lift anything heavy and not having any pain at the time.  Does have a history of chronic back pain but this is different.  The pain does not radiate down her legs.  There is no numbness or tingling.  There is no bowel or bladder incontinence.  There is no fever or vomiting.  No pain with urination or blood in the urine.  No chest pain or shortness of breath.  No cough or fever.  Concern for possible kidney stone.  She is having nausea denies any vomiting.  Hypertensive did not take her medication this morning.  The history is provided by the patient.  Back Pain Associated symptoms: no abdominal pain and no chest pain        Home Medications Prior to Admission medications   Medication Sig Start Date End Date Taking? Authorizing Provider  amLODipine (NORVASC) 5 MG tablet Take 2.5 mg by mouth daily. 11/11/20   [provider]  Cholecalciferol (VITAMIN D3) 1000 units CAPS Take by mouth. One daily    [provider]  ciclopirox (PENLAC) 8 % solution APPLY TOPICALLY TO AFFECTED TOE NAILS D 06/26/19   [provider]  Co-Enzyme Q10 100 MG CAPS Take 100 mg by mouth 2 (two) times daily.    [provider]  esomeprazole (NEXIUM) 20 MG capsule Take 20 mg by mouth daily at 12 noon.    [provider]  hydrochlorothiazide (HYDRODIURIL) 25 MG tablet Take 1 tablet (25 mg total) by mouth daily. Patient not taking: Reported on 02/05/2022 01/23/21   Nahser, Wonda Cheng, MD  hydrocortisone  (ANUSOL-HC) 25 MG suppository Place 1 suppository (25 mg total) rectally at bedtime as needed for hemorrhoids or anal itching. Patient not taking: Reported on 02/05/2022 06/26/18   Esterwood, Amy S, PA-C  levothyroxine (SYNTHROID, LEVOTHROID) 88 MCG tablet Take 88 mcg by mouth daily before breakfast.    [provider]  losartan (COZAAR) 100 MG tablet Take 100 mg by mouth daily. as directed 06/12/17   [provider]  metoprolol succinate (TOPROL-XL) 50 MG 24 hr tablet Take 1 tablet (50 mg total) by mouth daily. 02/15/20   Nahser, Wonda Cheng, MD  potassium chloride (KLOR-CON) 10 MEQ tablet Take 1 tablet (10 mEq total) by mouth daily. Patient not taking: Reported on 12/06/2021 01/23/21   Nahser, Wonda Cheng, MD  rizatriptan (MAXALT-MLT) 10 MG disintegrating tablet Take 1 tablet (10 mg total) by mouth as needed for migraine. May repeat in 2 hours if needed 01/04/20   Suzzanne Cloud, NP  rosuvastatin (CRESTOR) 10 MG tablet Take 10 mg by mouth daily.    [provider]      Allergies    Elemental sulfur    Review of Systems   Review of Systems  HENT:  Negative for congestion.   Respiratory:  Negative for chest tightness and shortness of breath.   Cardiovascular:  Negative for chest pain.  Gastrointestinal:  Positive for  nausea. Negative for abdominal pain and vomiting.  Musculoskeletal:  Positive for back pain.   all other systems are negative except as noted in the HPI and PMH.    Physical Exam Updated Vital Signs BP (!) 193/78   Pulse 66   Temp 98.2 F (36.8 C) (Oral)   Resp 16   SpO2 95%  Physical Exam Vitals and nursing note reviewed.  Constitutional:      General: She is not in acute distress.    Appearance: She is well-developed.  HENT:     Head: Normocephalic and atraumatic.     Mouth/Throat:     Pharynx: No oropharyngeal exudate.  Eyes:     Conjunctiva/sclera: Conjunctivae normal.     Pupils: Pupils are equal, round, and reactive to light.  Neck:      Comments: No meningismus. Cardiovascular:     Rate and Rhythm: Normal rate and regular rhythm.     Heart sounds: Normal heart sounds. No murmur heard. Pulmonary:     Effort: Pulmonary effort is normal. No respiratory distress.     Breath sounds: Normal breath sounds.  Abdominal:     Palpations: Abdomen is soft.     Tenderness: There is no abdominal tenderness. There is no guarding or rebound.  Musculoskeletal:        General: Tenderness present. Normal range of motion.     Cervical back: Normal range of motion and neck supple.     Comments: R paraspinal lumbar tenderness. No midline tenderness  5/5 strength in bilateral lower extremities. Ankle plantar and dorsiflexion intact. Great toe extension intact bilaterally. +2 DP and PT pulses. +2 patellar reflexes bilaterally. Normal gait.   Skin:    General: Skin is warm.  Neurological:     Mental Status: She is alert and oriented to person, place, and time.     Cranial Nerves: No cranial nerve deficit.     Motor: No abnormal muscle tone.     Coordination: Coordination normal.     Comments:  5/5 strength throughout. CN 2-12 intact.Equal grip strength.   Psychiatric:        Behavior: Behavior normal.     ED Results / Procedures / Treatments   Labs (all labs ordered are listed, but only abnormal results are displayed) Labs Reviewed  URINALYSIS, ROUTINE W REFLEX MICROSCOPIC - Abnormal; Notable for the following components:      Result Value   Color, Urine COLORLESS (*)    All other components within normal limits  BASIC METABOLIC PANEL - Abnormal; Notable for the following components:   Glucose, Bld 131 (*)    All other components within normal limits  HEPATIC FUNCTION PANEL - Abnormal; Notable for the following components:   Total Bilirubin 1.3 (*)    Indirect Bilirubin 1.1 (*)    All other components within normal limits  CBC WITH DIFFERENTIAL/PLATELET  LIPASE, BLOOD    EKG EKG Interpretation  Date/Time:  Wednesday  September 12 2022 08:15:31 EDT Ventricular Rate:  58 PR Interval:  177 QRS Duration: 152 QT Interval:  454 QTC Calculation: 446 R Axis:   -83 Text Interpretation: Sinus rhythm Nonspecific IVCD with LAD Left ventricular hypertrophy Anterior infarct, old No significant change was found Confirmed by Ezequiel Essex [phone]) on 09/12/2022 8:25:09 AM  Radiology CT Angio Chest/Abd/Pel for Dissection W and/or Wo Contrast  Result Date: 09/12/2022 CLINICAL DATA:  Right back pain that started 2 days ago. Nausea. Patient is unable to eat. Acute aortic syndrome suspected EXAM: CT ANGIOGRAPHY  CHEST, ABDOMEN AND PELVIS TECHNIQUE: Non-contrast CT of the chest was initially obtained. Multidetector CT imaging through the chest, abdomen and pelvis was performed using the standard protocol during bolus administration of intravenous contrast. Multiplanar reconstructed images and MIPs were obtained and reviewed to evaluate the vascular anatomy. RADIATION DOSE REDUCTION: This exam was performed according to the departmental dose-optimization program which includes automated exposure control, adjustment of the mA and/or kV according to patient size and/or use of iterative reconstruction technique. CONTRAST:  1100m OMNIPAQUE IOHEXOL 350 MG/ML SOLN COMPARISON:  None Available. FINDINGS: CTA CHEST FINDINGS Cardiovascular: Preferential opacification of the thoracic aorta. No evidence of thoracic aortic aneurysm or dissection. Normal heart size. No pericardial effusion. Aortic atherosclerotic calcifications. Mild coronary artery atherosclerotic calcifications. Mediastinum/Nodes: No enlarged mediastinal, hilar, or axillary lymph nodes. Thyroid gland, trachea, and esophagus demonstrate no significant findings. Lungs/Pleura: Lungs are clear. No pleural effusion or pneumothorax. Musculoskeletal: No chest wall abnormality. No acute or significant osseous findings. Review of the MIP images confirms the above findings. CTA ABDOMEN AND  PELVIS FINDINGS VASCULAR Aorta: Normal caliber aorta without aneurysm, dissection, vasculitis or significant stenosis. Celiac: Patent without evidence of aneurysm, dissection, vasculitis or significant stenosis. SMA: Patent without evidence of aneurysm, dissection, vasculitis or significant stenosis. Renals: Both renal arteries are patent without evidence of aneurysm, dissection, vasculitis, fibromuscular dysplasia or significant stenosis. IMA: Patent without evidence of aneurysm, dissection, vasculitis or significant stenosis. Inflow: Patent without evidence of aneurysm, dissection, vasculitis or significant stenosis. Veins: No obvious venous abnormality within the limitations of this arterial phase study. Review of the MIP images confirms the above findings. NON-VASCULAR Hepatobiliary: No focal liver abnormality is seen. No gallstones, gallbladder wall thickening, or biliary dilatation. Pancreas: Unremarkable. No pancreatic ductal dilatation or surrounding inflammatory changes. Spleen: Normal in size without focal abnormality. Adrenals/Urinary Tract: Adrenal glands are unremarkable. Kidneys are normal, without renal calculi, focal lesion, or hydronephrosis. Bladder is unremarkable. Stomach/Bowel: Stomach is within normal limits. Appendix appears normal. No evidence of bowel wall thickening, distention, or inflammatory changes. Lymphatic: No lymphadenopathy. Reproductive: Uterus and bilateral adnexa are unremarkable. Other: No abdominal wall hernia or abnormality. No abdominopelvic ascites. 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 No acute osseous abnormality. Review of the MIP images confirms the above findings. IMPRESSION: 1. No evidence of thoracic or abdominal aortic aneurysm or dissection. 2. Mild coronary artery atherosclerotic calcifications. 3. Lungs are clear. 4. No CT evidence of acute abdominal/pelvic process. 5. 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 Electronically Signed   By: IKeane PoliceD.O.   On: 09/12/2022 09:51    Procedures Procedures    Medications Ordered in ED Medications  losartan (COZAAR) tablet 100 mg (has no administration in time range)  HYDROcodone-acetaminophen (NORCO/VICODIN) 5-325 MG per tablet 1 tablet (has no administration in time range)  ondansetron (ZOFRAN-ODT) disintegrating tablet 4 mg (has no administration in time range)  methocarbamol (ROBAXIN) tablet 500 mg (has no administration in time range)    ED Course/ Medical Decision Making/ A&P                           Medical Decision Making Amount and/or Complexity of Data Reviewed Labs: ordered. Decision-making details documented in ED Course. Radiology: ordered and independent interpretation performed. Decision-making details documented in ED Course. ECG/medicine tests: ordered and independent interpretation performed. Decision-making details documented in ED Course.  Risk Prescription drug management.  Low back pain for the past 2 days  without specific injury.  Nausea but no vomiting.  Intact distal strength, sensation, pulses and reflexes.  Low suspicion for cord compression or cauda equina.  Considered kidney stone, UTI, musculoskeletal back pain.  Low suspicion for aortic aneurysm or aortic dissection.  She was given her home blood pressure medications.  No evidence of kidney stone on imaging.  Results reviewed and interpreted by me.  No evidence of abdominal aortic aneurysm. UA negative. No hematuria or infection.  Blood pressure improved to 160/73.  Suspect musculoskeletal back pain.  We will treat supportively with anti-inflammatories and muscle relaxers.  Follow-up with PCP.  Return to the ED with worsening pain, weakness, numbness, tingling, bowel or bladder incontinence or any other concerns.        Final Clinical Impression(s) / ED Diagnoses Final diagnoses:  Acute right-sided low back pain without sciatica     Rx / DC Orders ED Discharge Orders     None         Gwendola Hornaday, Annie Main, MD 09/12/22 1039

## 2022-09-17 ENCOUNTER — Ambulatory Visit (AMBULATORY_SURGERY_CENTER): Payer: Self-pay

## 2022-09-17 VITALS — Ht 64.5 in | Wt 247.0 lb

## 2022-09-17 DIAGNOSIS — Z86010 Personal history of colonic polyps: Secondary | ICD-10-CM

## 2022-09-17 MED ORDER — NA SULFATE-K SULFATE-MG SULF 17.5-3.13-1.6 GM/177ML PO SOLN: 1.0000 | ORAL | 0 refills | Status: DC

## 2022-09-17 NOTE — Progress Notes (Signed)

## 2022-09-20 ENCOUNTER — Other Ambulatory Visit (HOSPITAL_COMMUNITY): Payer: Self-pay | Admitting: Surgical

## 2022-09-20 DIAGNOSIS — M545 Low back pain, unspecified: Secondary | ICD-10-CM | POA: Diagnosis not present

## 2022-09-20 MED ORDER — LIDOCAINE 5 % EX PTCH: 1.0000 | MEDICATED_PATCH | Freq: Two times a day (BID) | CUTANEOUS | 71 refills | Status: DC

## 2022-09-20 MED ORDER — ONDANSETRON 4 MG PO TBDP: 4.0000 mg | ORAL_TABLET | Freq: Three times a day (TID) | ORAL | 0 refills | Status: DC | PRN

## 2022-09-20 MED ORDER — CELECOXIB 100 MG PO CAPS: 100.0000 mg | ORAL_CAPSULE | Freq: Every day | ORAL | 0 refills | Status: DC | PRN

## 2022-10-09 ENCOUNTER — Encounter: Payer: Self-pay | Admitting: Internal Medicine

## 2022-10-09 ENCOUNTER — Encounter: Payer: Self-pay | Admitting: Certified Registered Nurse Anesthetist

## 2022-10-12 DIAGNOSIS — Z0000 Encounter for general adult medical examination without abnormal findings: Secondary | ICD-10-CM | POA: Diagnosis not present

## 2022-10-12 DIAGNOSIS — E039 Hypothyroidism, unspecified: Secondary | ICD-10-CM | POA: Diagnosis not present

## 2022-10-16 ENCOUNTER — Encounter: Payer: Self-pay | Admitting: Internal Medicine

## 2022-10-16 ENCOUNTER — Ambulatory Visit (AMBULATORY_SURGERY_CENTER): Payer: BC Managed Care – PPO | Admitting: Internal Medicine

## 2022-10-16 VITALS — BP 179/73 | HR 65 | Temp 99.1°F | Resp 18 | Ht 64.5 in | Wt 247.0 lb

## 2022-10-16 DIAGNOSIS — Z09 Encounter for follow-up examination after completed treatment for conditions other than malignant neoplasm: Secondary | ICD-10-CM

## 2022-10-16 DIAGNOSIS — Z1211 Encounter for screening for malignant neoplasm of colon: Secondary | ICD-10-CM | POA: Diagnosis not present

## 2022-10-16 DIAGNOSIS — Z86010 Personal history of colonic polyps: Secondary | ICD-10-CM

## 2022-10-16 MED ORDER — SODIUM CHLORIDE 0.9 % IV SOLN: 500.0000 mL | Freq: Once | INTRAVENOUS | Status: DC

## 2022-10-16 NOTE — Op Note (Signed)
[facility] Patient Name: Carolyn Mcguire Procedure Date: 10/16/2022 8:38 AM MRN: [id number] Endoscopist: Jerene Bears , MD, [id number] Age: 66 Referring MD:  Date of Birth: [birth year] Gender: Female Account #: [id number] Procedure:                Colonoscopy Indications:              High risk colon cancer surveillance: Personal                            history of sessile serrated colon polyps (less than                            10 mm in size) with no dysplasia, Last colonoscopy:                            July 2018 Medicines:                Monitored Anesthesia Care Procedure:                Pre-Anesthesia Assessment:                           - Prior to the procedure, a History and Physical                            was performed, and patient medications and                            allergies were reviewed. The patient's tolerance of                            previous anesthesia was also reviewed. The risks                            and benefits of the procedure and the sedation                            options and risks were discussed with the patient.                            All questions were answered, and informed consent                            was obtained. Prior Anticoagulants: The patient has                            taken no anticoagulant or antiplatelet agents. ASA                            Grade Assessment: III - A patient with severe                            systemic disease. After reviewing the risks and  benefits, the patient was deemed in satisfactory                            condition to undergo the procedure.                           After obtaining informed consent, the colonoscope                            was passed under direct vision. Throughout the                            procedure, the patient's blood pressure, pulse, and                            oxygen saturations were monitored continuously.  The                            [id number]-HQ190L Colonoscope was introduced through the                            anus and advanced to the cecum, identified by                            appendiceal orifice and ileocecal valve. The                            colonoscopy was performed without difficulty. The                            patient tolerated the procedure well. The quality                            of the bowel preparation was good. The ileocecal                            valve, appendiceal orifice, and rectum were                            photographed. Scope In: 8:45:04 AM Scope Out: 9:03:23 AM Scope Withdrawal Time: 0 hours 11 minutes 27 seconds  Total Procedure Duration: 0 hours 18 minutes 19 seconds  Findings:                 The digital rectal exam was normal.                           Multiple medium-mouthed and small-mouthed                            diverticula were found in the sigmoid colon.                           Internal hemorrhoids were found during  retroflexion. The hemorrhoids were small.                           The exam was otherwise without abnormality. Complications:            No immediate complications. Estimated Blood Loss:     Estimated blood loss: none. Impression:               - Moderate diverticulosis in the sigmoid colon.                           - Small internal hemorrhoids.                           - The examination was otherwise normal.                           - No specimens collected. Recommendation:           - Patient has a contact number available for                            emergencies. The signs and symptoms of potential                            delayed complications were discussed with the                            patient. Return to normal activities tomorrow.                            Written discharge instructions were provided to the                            patient.                            - Resume previous diet.                           - Continue present medications.                           - Repeat colonoscopy in 10 years for surveillance. Jerene Bears, MD 10/16/2022 9:05:31 AM This report has been signed electronically.

## 2022-10-16 NOTE — Progress Notes (Signed)
Report given to PACU, vss 

## 2022-10-16 NOTE — Progress Notes (Signed)
GASTROENTEROLOGY PROCEDURE H&P NOTE   Primary Care Physician: [name], MD    Reason for Procedure:   Hx of SSPs  Plan:    colonoscopy  Patient is appropriate for endoscopic procedure(s) in the ambulatory ([facility]) setting.  The nature of the procedure, as well as the risks, benefits, and alternatives were carefully and thoroughly reviewed with the patient. Ample time for discussion and questions allowed. The patient understood, was satisfied, and agreed to proceed.     HPI: Carolyn Mcguire is a 66 y.o. female who presents for surveillance colonoscopy.  Medical history as below.  Tolerated the prep.  No recent chest pain or shortness of breath.  No abdominal pain today.  Past Medical History:  Diagnosis Date   Adenomatous colon polyp    Allergic rhinitis    Animal bite 05/2015   fox    Anxiety    Arthritis    shoulder   Carpal tunnel syndrome    RIGHT HAND   Depression    Diastolic dysfunction    a. 2D echo 09/08/15: EF 55-60%, grade 1 DD, elevated LVEDP, mild MR, mild TR. - normal BNP 08/2015.   Essential hypertension    GERD (gastroesophageal reflux disease)    Headache    Heart murmur    Hepatic steatosis    Hyperlipidemia    Hypothyroidism    LBBB (left bundle branch block)    a. negative nuclear stress test 06/2011.   Migraines    Obesity    OSA (obstructive sleep apnea)    mild    Past Surgical History:  Procedure Laterality Date   DERMOID CYST  EXCISION     OF HER RIGHT OVARY. SIZE OF A GRAPEFRUIT. OVARY IS STILL THERE.   HEMITHYROIDECTOMY     RUPTURE BELLY BUTTON     WHEN SHE WAS AN INFANT   TONSILECTOMY, ADENOIDECTOMY, BILATERAL MYRINGOTOMY AND TUBES     AGE 14, pt states she didn't have tubes put into her ears    Prior to Admission medications   Medication Sig Start Date End Date Taking? Authorizing Provider  amLODipine (NORVASC) 5 MG tablet Take 2.5 mg by mouth daily. 11/11/20  Yes [provider]  Azelaic Acid 15 % gel Apply  topically. 05/16/22  Yes [provider]  Cholecalciferol (VITAMIN D3) 1000 units CAPS Take by mouth. One daily   Yes [provider]  Co-Enzyme Q10 100 MG CAPS Take 100 mg by mouth 2 (two) times daily.   Yes [provider]  levothyroxine (SYNTHROID, LEVOTHROID) 88 MCG tablet Take 88 mcg by mouth daily before breakfast.   Yes [provider]  losartan (COZAAR) 100 MG tablet Take 100 mg by mouth daily. as directed 06/12/17  Yes [provider]  metoprolol succinate (TOPROL-XL) 50 MG 24 hr tablet Take 1 tablet (50 mg total) by mouth daily. 02/15/20  Yes Nahser, Wonda Cheng, MD  rosuvastatin (CRESTOR) 10 MG tablet Take 10 mg by mouth daily.   Yes [provider]  celecoxib (CELEBREX) 100 MG capsule Take 1-2 capsules (100-200 mg total) by mouth daily as needed (pain). 09/20/22   Ventura Bruns, PA-C  ciclopirox (PENLAC) 8 % solution APPLY TOPICALLY TO AFFECTED TOE NAILS D Patient not taking: Reported on 09/17/2022 06/26/19   [provider]  esomeprazole (NEXIUM) 20 MG capsule Take 20 mg by mouth daily at 12 noon. Patient not taking: Reported on 09/17/2022    [provider]  hydrochlorothiazide (HYDRODIURIL) 25 MG tablet Take  1 tablet (25 mg total) by mouth daily. Patient not taking: Reported on 02/05/2022 01/23/21   Nahser, Wonda Cheng, MD  hydrocortisone (ANUSOL-HC) 25 MG suppository Place 1 suppository (25 mg total) rectally at bedtime as needed for hemorrhoids or anal itching. Patient not taking: Reported on 02/05/2022 06/26/18   Esterwood, Amy S, PA-C  lidocaine (LIDODERM) 5 % Place 1 patch onto the skin every 12 (twelve) hours. Remove & Discard patch within 12 hours or as directed by MD Patient not taking: Reported on 10/16/2022 09/20/22 09/20/23  Ventura Bruns, PA-C  methocarbamol (ROBAXIN) 500 MG tablet Take 1 tablet (500 mg total) by mouth every 8 (eight) hours as needed for muscle spasms. Patient not taking: Reported on 09/17/2022  09/12/22   Ezequiel Essex, MD  ondansetron (ZOFRAN-ODT) 4 MG disintegrating tablet Take 1 tablet (4 mg total) by mouth every 8 (eight) hours as needed for nausea or vomiting. 09/20/22   Merlene Pulling K, PA-C  potassium chloride (KLOR-CON) 10 MEQ tablet Take 1 tablet (10 mEq total) by mouth daily. Patient not taking: Reported on 12/06/2021 01/23/21   Nahser, Wonda Cheng, MD  rizatriptan (MAXALT-MLT) 10 MG disintegrating tablet Take 1 tablet (10 mg total) by mouth as needed for migraine. May repeat in 2 hours if needed Patient not taking: Reported on 09/17/2022 01/04/20   Suzzanne Cloud, NP    Current Outpatient Medications  Medication Sig Dispense Refill   amLODipine (NORVASC) 5 MG tablet Take 2.5 mg by mouth daily.     Azelaic Acid 15 % gel Apply topically.     Cholecalciferol (VITAMIN D3) 1000 units CAPS Take by mouth. One daily     Co-Enzyme Q10 100 MG CAPS Take 100 mg by mouth 2 (two) times daily.     levothyroxine (SYNTHROID, LEVOTHROID) 88 MCG tablet Take 88 mcg by mouth daily before breakfast.     losartan (COZAAR) 100 MG tablet Take 100 mg by mouth daily. as directed  3   metoprolol succinate (TOPROL-XL) 50 MG 24 hr tablet Take 1 tablet (50 mg total) by mouth daily. 90 tablet 3   rosuvastatin (CRESTOR) 10 MG tablet Take 10 mg by mouth daily.     celecoxib (CELEBREX) 100 MG capsule Take 1-2 capsules (100-200 mg total) by mouth daily as needed (pain). 60 capsule 0   ciclopirox (PENLAC) 8 % solution APPLY TOPICALLY TO AFFECTED TOE NAILS D (Patient not taking: Reported on 09/17/2022)     esomeprazole (NEXIUM) 20 MG capsule Take 20 mg by mouth daily at 12 noon. (Patient not taking: Reported on 09/17/2022)     hydrochlorothiazide (HYDRODIURIL) 25 MG tablet Take 1 tablet (25 mg total) by mouth daily. (Patient not taking: Reported on 02/05/2022) 90 tablet 3   hydrocortisone (ANUSOL-HC) 25 MG suppository Place 1 suppository (25 mg total) rectally at bedtime as needed for hemorrhoids or anal itching.  (Patient not taking: Reported on 02/05/2022) 6 suppository 3   lidocaine (LIDODERM) 5 % Place 1 patch onto the skin every 12 (twelve) hours. Remove & Discard patch within 12 hours or as directed by MD (Patient not taking: Reported on 10/16/2022) 10 each 71   methocarbamol (ROBAXIN) 500 MG tablet Take 1 tablet (500 mg total) by mouth every 8 (eight) hours as needed for muscle spasms. (Patient not taking: Reported on 09/17/2022) 20 tablet 0   ondansetron (ZOFRAN-ODT) 4 MG disintegrating tablet Take 1 tablet (4 mg total) by mouth every 8 (eight) hours as needed for nausea or vomiting. 10 tablet 0  potassium chloride (KLOR-CON) 10 MEQ tablet Take 1 tablet (10 mEq total) by mouth daily. (Patient not taking: Reported on 12/06/2021) 90 tablet 3   rizatriptan (MAXALT-MLT) 10 MG disintegrating tablet Take 1 tablet (10 mg total) by mouth as needed for migraine. May repeat in 2 hours if needed (Patient not taking: Reported on 09/17/2022) 10 tablet 11   Current Facility-Administered Medications  Medication Dose Route Frequency Provider Last Rate Last Admin   0.9 %  sodium chloride infusion  500 mL Intravenous Once Deb Loudin, Lajuan Lines, MD        Allergies as of 10/16/2022 - Review Complete 10/16/2022  Allergen Reaction Noted   Elemental sulfur Rash 06/15/2011    Family History  Problem Relation Age of Onset   Neuropathy Mother    Parkinsonism Mother    Ovarian cancer Mother        dx. 61-59; TAH-BSO at 33   Diabetes Mother    Pancreatic cancer Mother 70       d. 58   Skin cancer Mother        maybe basal cell carcinoma   Obesity Mother    Heart disease Father    Bladder Cancer Father        dx 26-81   Skin cancer Father        possibly melanoma; +sun exposure   Cancer Brother 42       salivary gland cancer; d. 14; no tobacco use   Breast cancer Maternal Aunt        dx unspecified age; s/p mastectomy   Other Maternal Aunt        intellectual disabilities   Heart failure Maternal Aunt 86        acute diastolic heart failure   Colon cancer Maternal Uncle        dx late 78s; d. early 70s   Heart attack Maternal Uncle 51   Cancer Paternal Uncle        CLL, dx. 50   Ovarian cancer Maternal Grandmother        d. 30; w/ mets   Heart attack Maternal Grandfather        d. 72   Esophageal cancer Neg Hx    Rectal cancer Neg Hx    Stomach cancer Neg Hx     Social History   Socioeconomic History   Marital status: Single    Spouse name: Not on file   Number of children: 0   Years of education: HS   Highest education level: Not on file  Occupational History   Occupation: Unemployed  Tobacco Use   Smoking status: Never   Smokeless tobacco: Never  Vaping Use   Vaping Use: Never used  Substance and Sexual Activity   Alcohol use: No   Drug use: No   Sexual activity: Not Currently  Other Topics Concern   Not on file  Social History Narrative   Lives at home alone.   Right-handed.   She does not drink caffeine.  She will occasionally eat chocolate.   Social Determinants of Health   Financial Resource Strain: Not on file  Food Insecurity: Not on file  Transportation Needs: Not on file  Physical Activity: Not on file  Stress: Not on file  Social Connections: Not on file  Intimate Partner Violence: Not on file    Physical Exam: Vital signs in last 24 hours: '@BP'$  139/65   Pulse 82   Temp 99.1 F (37.3 C)   Ht 5' 4.5" (  1.638 m)   Wt 247 lb (112 kg)   SpO2 99%   BMI 41.74 kg/m  GEN: NAD EYE: Sclerae anicteric ENT: MMM CV: Non-tachycardic Pulm: CTA b/l GI: Soft, NT/ND NEURO:  Alert & Oriented x 3   Zenovia Jarred, MD [facility]  10/16/2022 8:33 AM

## 2022-10-16 NOTE — Progress Notes (Signed)
Pt's states no medical or surgical changes since previsit or office visit. 

## 2022-10-16 NOTE — Patient Instructions (Addendum)
-   Patient has a contact number available for emergencies. The signs and symptoms of potential delayed complications were discussed with the patient. Return to normal activities tomorrow. Written discharge instructions were provided to the patient. - Resume previous diet. - Continue present medications. - Repeat colonoscopy in 10 years for surveillance.  YOU HAD AN ENDOSCOPIC PROCEDURE TODAY AT [facility]:   Refer to the procedure report that was given to you for any specific questions about what was found during the examination.  If the procedure report does not answer your questions, please call your gastroenterologist to clarify.  If you requested that your care partner not be given the details of your procedure findings, then the procedure report has been included in a sealed envelope for you to review at your convenience later.  YOU SHOULD EXPECT: Some feelings of bloating in the abdomen. Passage of more gas than usual.  Walking can help get rid of the air that was put into your GI tract during the procedure and reduce the bloating. If you had a lower endoscopy (such as a colonoscopy or flexible sigmoidoscopy) you may notice spotting of blood in your stool or on the toilet paper. If you underwent a bowel prep for your procedure, you may not have a normal bowel movement for a few days.  Please Note:  You might notice some irritation and congestion in your nose or some drainage.  This is from the oxygen used during your procedure.  There is no need for concern and it should clear up in a day or so.  SYMPTOMS TO REPORT IMMEDIATELY:  Following lower endoscopy (colonoscopy or flexible sigmoidoscopy):  Excessive amounts of blood in the stool  Significant tenderness or worsening of abdominal pains  Swelling of the abdomen that is new, acute  Fever of 100F or higher  For urgent or emergent issues, a gastroenterologist can be reached at any hour by calling [phone]. Do not  use MyChart messaging for urgent concerns.    DIET:  We do recommend a small meal at first, but then you may proceed to your regular diet.  Drink plenty of fluids but you should avoid alcoholic beverages for 24 hours.  ACTIVITY:  You should plan to take it easy for the rest of today and you should NOT DRIVE or use heavy machinery until tomorrow (because of the sedation medicines used during the test).    FOLLOW UP: Our staff will call the number listed on your records the next business day following your procedure.  We will call around 7:15- 8:00 am to check on you and address any questions or concerns that you may have regarding the information given to you following your procedure. If we do not reach you, we will leave a message.     If any biopsies were taken you will be contacted by phone or by letter within the next 1-3 weeks.  Please call us at [phone] if you have not heard about the biopsies in 3 weeks.    SIGNATURES/CONFIDENTIALITY: You and/or your care partner have signed paperwork which will be entered into your electronic medical record.  These signatures attest to the fact that that the information above on your After Visit Summary has been reviewed and is understood.  Full responsibility of the confidentiality of this discharge information lies with you and/or your care-partner.

## 2022-10-17 ENCOUNTER — Ambulatory Visit: Payer: BC Managed Care – PPO | Admitting: Neurology

## 2022-10-17 ENCOUNTER — Telehealth: Payer: Self-pay

## 2022-10-17 DIAGNOSIS — Z0000 Encounter for general adult medical examination without abnormal findings: Secondary | ICD-10-CM | POA: Diagnosis not present

## 2022-10-17 DIAGNOSIS — Z23 Encounter for immunization: Secondary | ICD-10-CM | POA: Diagnosis not present

## 2022-10-17 DIAGNOSIS — I8393 Asymptomatic varicose veins of bilateral lower extremities: Secondary | ICD-10-CM | POA: Diagnosis not present

## 2022-10-17 DIAGNOSIS — E039 Hypothyroidism, unspecified: Secondary | ICD-10-CM | POA: Diagnosis not present

## 2022-10-17 NOTE — Telephone Encounter (Signed)
  Follow up Call-     10/16/2022    7:46 AM  Call back number  Post procedure Call Back phone  # [phone]  Permission to leave phone message Yes     Patient questions:  Do you have a fever, pain , or abdominal swelling? No. Pain Score  0 *  Have you tolerated food without any problems? Yes.    Have you been able to return to your normal activities? Yes.    Do you have any questions about your discharge instructions: Diet   No. Medications  No. Follow up visit  No.  Do you have questions or concerns about your Care? No.  Actions: * If pain score is 4 or above: No action needed, pain <4.

## 2022-10-30 DIAGNOSIS — L218 Other seborrheic dermatitis: Secondary | ICD-10-CM | POA: Diagnosis not present

## 2022-10-30 DIAGNOSIS — L718 Other rosacea: Secondary | ICD-10-CM | POA: Diagnosis not present

## 2022-10-30 DIAGNOSIS — L821 Other seborrheic keratosis: Secondary | ICD-10-CM | POA: Diagnosis not present

## 2022-10-30 DIAGNOSIS — L814 Other melanin hyperpigmentation: Secondary | ICD-10-CM | POA: Diagnosis not present

## 2022-10-31 ENCOUNTER — Ambulatory Visit (INDEPENDENT_AMBULATORY_CARE_PROVIDER_SITE_OTHER): Payer: BC Managed Care – PPO | Admitting: Neurology

## 2022-10-31 ENCOUNTER — Encounter: Payer: Self-pay | Admitting: Neurology

## 2022-10-31 VITALS — BP 142/80 | HR 74 | Ht 64.0 in | Wt 255.0 lb

## 2022-10-31 DIAGNOSIS — G43709 Chronic migraine without aura, not intractable, without status migrainosus: Secondary | ICD-10-CM | POA: Diagnosis not present

## 2022-10-31 DIAGNOSIS — M47812 Spondylosis without myelopathy or radiculopathy, cervical region: Secondary | ICD-10-CM | POA: Diagnosis not present

## 2022-10-31 DIAGNOSIS — G4486 Cervicogenic headache: Secondary | ICD-10-CM

## 2022-10-31 MED ORDER — GABAPENTIN 100 MG PO CAPS: 100.0000 mg | ORAL_CAPSULE | Freq: Two times a day (BID) | ORAL | 5 refills | Status: DC

## 2022-10-31 NOTE — Progress Notes (Signed)
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  Take '100mg'$  at bedtime for one week, then increase to '100mg'$  twice daily.  We can increase dose in 2 weeks if needed Migraine rescue:  Will have her try Ubrelvy '100mg'$  sample. Limit use of pain relievers to no more than 2 days out of week to prevent risk of rebound or medication-overuse headache. Headache diary Follow up 4-5 months.       Subjective:  Carolyn Mcguire is a 66 year old female with HTN, diastolic dysfunction, HLD, hypothyroidism who follows up for migraines.  UPDATE: She lost the samples of Ubrelvy so she never had a chance to get it.  She has a persistent low-grade headache.    As for her migraines: Intensity:  severe Duration:  within an hour.   Frequency:  7 days in November   Current NSAIDS/analgesics:  Tylenol Arthritis (once every 2 weeks) Current triptans:  none Current ergotamine:  none Current anti-emetic:  Zofran ODT '4mg'$  Current muscle relaxants:  none Current Antihypertensive medications:  metoprolol succinate, amlodipine,losartan Current Antidepressant medications:  none Current Anticonvulsant medications:  none Current anti-CGRP:  none Current Vitamins/Herbal/Supplements:  CoQ10, KCl Current Antihistamines/Decongestants:  none Other therapy:  cold packs, hot shower Hormone/birth control:  none Has seen ENT for stapedius myoclonus.  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  Caffeine:  No coffee.  Drinks tea 1 to 2 times a week Diet:  Tries to increase water intake.  No soda.  She says she needs to lose weight.   Exercise: trying to walk more.   Depression:  some; Anxiety:  some Other pain:  generalized aches, arthritis Sleep hygiene:  Has mild to moderate OSA.  Does not  use CPAP.  Sleep is good. 4-6 hours.  Feels tired in the day.  HISTORY: Onset:  Since around 66 years old Location:  diffuse, occasionally just temples or back of head.  Some neck pain (has arthritis in neck) Quality:  pounding Intensity:  8/10.  Aura:  absent Prodrome:  absent Associated symptoms:  Nausea, photophobia, phonophobia.  She denies associated visual disturbance, unilateral numbness or weakness. Duration:  a couple of hours to all day Frequency:  2 days a week Frequency of abortive medication:  Tylenol Arthritis once a week.  Rarely rizatriptan Triggers:  maybe change in weather, stress Relieving factors:  rest in dark room, hot shower, cool pack to head.   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      Past NSAIDS/analgesics:  naproxen, meloxicam Past abortive triptans:  sumatriptan '50mg'$ , rizatriptan '10mg'$  Past abortive ergotamine:  none Past muscle relaxants:  baclofen, Robaxin Past anti-emetic:  Zofran '8mg'$  Past antihypertensive medications:  furosemide, valsartan, HCTZ Past antidepressant medications:  venlafaxine, sertraline, bupropion Past anticonvulsant medications:  topiramate Past anti-CGRP:  none Past vitamins/Herbal/Supplements:  none Past antihistamines/decongestants:  none Other past therapies:  trigger point injections in back of head/neck    Family history of headache:  No   PAST MEDICAL HISTORY: Past Medical History:  Diagnosis Date   Adenomatous colon polyp    Allergic rhinitis    Animal bite 05/2015  fox    Anxiety    Arthritis    shoulder   Carpal tunnel syndrome    RIGHT HAND   Depression    Diastolic dysfunction    a. 2D echo 09/08/15: EF 55-60%, grade 1 DD, elevated LVEDP, mild MR, mild TR. - normal BNP 08/2015.   Essential hypertension    GERD  (gastroesophageal reflux disease)    Headache    Heart murmur    Hepatic steatosis    Hyperlipidemia    Hypothyroidism    LBBB (left bundle branch block)    a. negative nuclear stress test 06/2011.   Migraines    Obesity    OSA (obstructive sleep apnea)    mild    MEDICATIONS: Current Outpatient Medications on File Prior to Visit  Medication Sig Dispense Refill   amLODipine (NORVASC) 5 MG tablet Take 2.5 mg by mouth daily.     Azelaic Acid 15 % gel Apply topically.     celecoxib (CELEBREX) 100 MG capsule Take 1-2 capsules (100-200 mg total) by mouth daily as needed (pain). 60 capsule 0   Cholecalciferol (VITAMIN D3) 1000 units CAPS Take by mouth. One daily     ciclopirox (PENLAC) 8 % solution APPLY TOPICALLY TO AFFECTED TOE NAILS D (Patient not taking: Reported on 09/17/2022)     Co-Enzyme Q10 100 MG CAPS Take 100 mg by mouth 2 (two) times daily.     esomeprazole (NEXIUM) 20 MG capsule Take 20 mg by mouth daily at 12 noon. (Patient not taking: Reported on 09/17/2022)     hydrochlorothiazide (HYDRODIURIL) 25 MG tablet Take 1 tablet (25 mg total) by mouth daily. (Patient not taking: Reported on 02/05/2022) 90 tablet 3   hydrocortisone (ANUSOL-HC) 25 MG suppository Place 1 suppository (25 mg total) rectally at bedtime as needed for hemorrhoids or anal itching. (Patient not taking: Reported on 02/05/2022) 6 suppository 3   levothyroxine (SYNTHROID, LEVOTHROID) 88 MCG tablet Take 88 mcg by mouth daily before breakfast.     lidocaine (LIDODERM) 5 % Place 1 patch onto the skin every 12 (twelve) hours. Remove & Discard patch within 12 hours or as directed by MD (Patient not taking: Reported on 10/16/2022) 10 each 71   losartan (COZAAR) 100 MG tablet Take 100 mg by mouth daily. as directed  3   methocarbamol (ROBAXIN) 500 MG tablet Take 1 tablet (500 mg total) by mouth every 8 (eight) hours as needed for muscle spasms. (Patient not taking: Reported on 09/17/2022) 20 tablet 0   metoprolol succinate  (TOPROL-XL) 50 MG 24 hr tablet Take 1 tablet (50 mg total) by mouth daily. 90 tablet 3   ondansetron (ZOFRAN-ODT) 4 MG disintegrating tablet Take 1 tablet (4 mg total) by mouth every 8 (eight) hours as needed for nausea or vomiting. 10 tablet 0   potassium chloride (KLOR-CON) 10 MEQ tablet Take 1 tablet (10 mEq total) by mouth daily. (Patient not taking: Reported on 12/06/2021) 90 tablet 3   rizatriptan (MAXALT-MLT) 10 MG disintegrating tablet Take 1 tablet (10 mg total) by mouth as needed for migraine. May repeat in 2 hours if needed (Patient not taking: Reported on 09/17/2022) 10 tablet 11   rosuvastatin (CRESTOR) 10 MG tablet Take 10 mg by mouth daily.     No current facility-administered medications on file prior to visit.    ALLERGIES: Allergies  Allergen Reactions   Elemental Sulfur Rash    MAKES PT RED    FAMILY HISTORY: Family History  Problem Relation Age of Onset  Neuropathy Mother    Parkinsonism Mother    Ovarian cancer Mother        dx. 45-59; TAH-BSO at 26   Diabetes Mother    Pancreatic cancer Mother 97       d. 74   Skin cancer Mother        maybe basal cell carcinoma   Obesity Mother    Heart disease Father    Bladder Cancer Father        dx 39-81   Skin cancer Father        possibly melanoma; +sun exposure   Cancer Brother 37       salivary gland cancer; d. 23; no tobacco use   Breast cancer Maternal Aunt        dx unspecified age; s/p mastectomy   Other Maternal Aunt        intellectual disabilities   Heart failure Maternal Aunt 86       acute diastolic heart failure   Colon cancer Maternal Uncle        dx late 80s; d. early 70s   Heart attack Maternal Uncle 51   Cancer Paternal Uncle        CLL, dx. 52   Ovarian cancer Maternal Grandmother        d. 61; w/ mets   Heart attack Maternal Grandfather        d. 72   Esophageal cancer Neg Hx    Rectal cancer Neg Hx    Stomach cancer Neg Hx       Objective:  Blood pressure (!) 142/80, pulse 74,  height '5\' 4"'$  (1.626 m), weight 255 lb (115.7 kg), SpO2 97 %. General: No acute distress.  Patient appears well-groomed.   Head:  Normocephalic/atraumatic Eyes:  Fundi examined but not visualized Neck: supple, no paraspinal tenderness, full range of motion Heart:  Regular rate and rhythm Lungs:  Clear to auscultation bilaterally Back: No paraspinal tenderness Neurological Exam: alert and oriented to person, place, and time.  Speech fluent and not dysarthric, language intact.  CN II-XII intact. Bulk and tone normal, muscle strength 5/5 throughout.  Sensation to light touch intact.  Deep tendon reflexes 2+ throughout, toes downgoing.  Finger to nose testing intact.  Gait normal, Romberg negative.   Metta Clines, DO  CC: [name], MD

## 2022-10-31 NOTE — Patient Instructions (Signed)
Take gabapentin 1 pill at bedtime for one week, then increase to 1 pill twice daily.  If no improvement in 2 weeks, contact me and we can increase dose Take Ubrelvy earliest onset of severe headache.  May repeat after 2 hours.  Maximum 2 tablets in 24 hours Limit use of pain relievers to no more than 2 days out of week to prevent risk of rebound or medication-overuse headache. Keep headache diary Follow up in 4-5 months.

## 2022-11-01 ENCOUNTER — Telehealth: Payer: Self-pay | Admitting: Neurology

## 2022-11-01 NOTE — Telephone Encounter (Signed)
Advised patient per last ov note try urblevy samples for rescue.  Per patient, she was advised to only take it only for an extreme Migraines.  Dr.Jaffe please advise.

## 2022-11-01 NOTE — Telephone Encounter (Signed)
Pt needs a refill on the Rizatriptan sent to the [facility]  on [address] [place]

## 2022-11-02 ENCOUNTER — Other Ambulatory Visit: Payer: Self-pay | Admitting: Neurology

## 2022-11-02 MED ORDER — RIZATRIPTAN BENZOATE 10 MG PO TBDP: 10.0000 mg | ORAL_TABLET | ORAL | 5 refills | Status: DC | PRN

## 2022-11-02 NOTE — Telephone Encounter (Signed)
Patient advised per Dr.jaffe,  I sent in a refill for the rizatriptan.

## 2022-11-22 ENCOUNTER — Telehealth: Payer: Self-pay

## 2022-11-22 ENCOUNTER — Other Ambulatory Visit (HOSPITAL_COMMUNITY): Payer: Self-pay

## 2022-11-22 NOTE — Telephone Encounter (Signed)
Pharmacy Patient Advocate Encounter   Received notification from CoverMyMeds that prior authorization for Rizatriptan Benzoate '10MG'$  dispersible tablets is required/requested.    PA submitted on 11/22/22 to ([place]) [employer] [place] via [place] Status is pending

## 2022-11-23 NOTE — Telephone Encounter (Signed)
Carolyn Mcguire from [place] [facility] called stating the PA for Rizatriptan Benzoate 10 mg was denied. Reason: no criteria was met.

## 2022-11-27 NOTE — Telephone Encounter (Signed)
Pharmacy Patient Advocate Encounter  Received notification from [facility] that the request for prior authorization for Rizatriptan Benzoate '10MG'$  dispersible tablets has been denied due to See Below.      Denial letter has been scanned into chart under the media tab.

## 2023-01-21 ENCOUNTER — Telehealth: Payer: Self-pay

## 2023-01-21 NOTE — Telephone Encounter (Signed)
Patient called in stating that she believes the Gabapentin has caused her weight gain and swelling. Has stopped taking it but is concerned.

## 2023-01-21 NOTE — Telephone Encounter (Signed)
Per Patient she started the Gabapentin until 2/19 and she was advised that a family member noticed her ankles swelling, hands swelling started two days ago. Per Patient her weight has went up since last weight of 255lbs.   Patient stopped taking it last night.  Patient wanted to know if she could just stop taking it since it was only a couple of days taken it or will she have to taper off.

## 2023-01-22 ENCOUNTER — Encounter: Payer: Self-pay | Admitting: Cardiovascular Disease

## 2023-01-22 NOTE — Telephone Encounter (Signed)
Tried calling patient no answer. LMOVM to call the office back.  Per Dr.Jaffe, She can just discontinue it.  Does she want to try a different medication?  When we first met a year ago, I suggested one of the monthly injections.  If agreeable, would you send in prescription for Ajovy every 30 days (5 refills)

## 2023-01-22 NOTE — Progress Notes (Unsigned)
Carolyn Mcguire Date of Birth  12-Dec-1955 [facility] [id number]    [place], [place]  [address] [phone]  Fax  [phone]  Problem list: 1. Left bundle branch block 2. Hypothyroidism 3. Hyperlipidemia 4. Hypertension 5. Dyspnea on exertion      67 yo female with a hx of LBBB.  Stress myoview was negative.  She's been on a diet and exercise program and has lost about 6 pounds since last year.  She is drinking more water, avoiding fast foods.  She is walking regularly.  She has been diagnosed with migraines.  She thinks muscle tension may be contributing to her headaches.  Oct. 5, 2016:  Carolyn Mcguire is seen today for follow up of her shortness of breath Has had many deaths in her family and became depressed . Was started on Welbutrin for depression .  She was seen in the ER. Was told by the pharmacist at her primary medical doctors office thought it was due to the discontinuation of her Welbutrin. She is still not feeling 100% better so she was scheduled to see me .  She is still short of breath.    DOE  With walking up from the buildings in the back of her property.  She does not getting any regular exercise. She was bitten by a fox on July 11.  Had a series of rabies shots ( the fox was rabid)  Is retired .   She has had a stressful 2 years.    Several deaths in her family . Has not had the time or inclination  Wants to start a walking program.     No CP   February 21, 2016: Doing well Has lost some weight .   Has lost 9 lbs since I last saw her  Walks regularly   Sept. 7, 2017: Doing well BP is doing well at home. Still walking some  Has been to a nutritionist.   March 2 , 2108: Carolyn Mcguire is seen today .  Follow up for hypertension and hyper-hyperlipidemia, and hypothyroidism. BP is well controlled.   February 28, 2018: Carolyn Mcguire is seen today for follow-up of her hypertension and hyperlipidemia.  She has a left bundle branch block. BP has been ok.   A bit  hight today  Wt = 250  ( up 7 lbs from last year )  Has been eating out lots  .     January 06, 2019: Carolyn Mcguire is seen today for follow-up of her hypertension and hyperlipidemia.  She has a left bundle branch block that is old.  Weight today is 246 pounds which is down 3 pounds from her last visit.  BP is well controlled Has DOE from her weight  January 20, 2020:  Carolyn Mcguire is seen back today for history of hypertension and hyperlipidemia.  She has a left bundle branch block.  Weight today is 249 pounds which is up 3 pounds of her last visit. Had irregular episode of tachycardia yesterday  Lasted several hours Had 2 separate episodes  -she took her blood pressure which was elevated.  She found that her heart rate was irregular in the 120s.  Her heart rate monitor indicated that her heart rate was irregular. Has not been watching her diet.   Still eating salty foods most meals   Feb. 28, 2022: Carolyn Mcguire is seen today for follow up of her HTN, HLD, LBBB and palpitations. She wore a 30 day monitor which did not reveal any evidence  of atrial fib and did not reveal any significant arrhythmias.  Has been busy taking care of her aunt who was in a MVA.  Has not been eating well ( eating lots of pre-prepared foods, not exercising )  She has tried HCTZ in the past but she became too volume depleted and it was stopped.   No CP , dspnea, no syncope  February 05, 2022 Carolyn Mcguire is seen for follow up of her HTN, HLD,  LBBB Some chest discomfort She has been lifting lots of heavy containers and thinks she might of strained her chest.  She also thinks it might be due to indigestion and is been taking an antacid.  She seems to be getting along better now  No additional salt .   Is walking some   Her last echocardiogram was performed in April, 2021.  It shows normal left ventricular systolic function with grade 1 diastolic dysfunction.   Feb. 28, 2024  Carolyn Mcguire is seen for follow up of her LBBB, HTN Echo Feb.  28, 2024:     Current Outpatient Medications on File Prior to Visit  Medication Sig Dispense Refill   amLODipine (NORVASC) 5 MG tablet Take 2.5 mg by mouth daily.     Azelaic Acid 15 % gel Apply topically.     Cholecalciferol (VITAMIN D3) 1000 units CAPS Take by mouth. One daily     ciclopirox (PENLAC) 8 % solution      Co-Enzyme Q10 100 MG CAPS Take 100 mg by mouth 2 (two) times daily.     gabapentin (NEURONTIN) 100 MG capsule Take 1 capsule (100 mg total) by mouth 2 (two) times daily. 60 capsule 5   hydrocortisone (ANUSOL-HC) 25 MG suppository Place 1 suppository (25 mg total) rectally at bedtime as needed for hemorrhoids or anal itching. 6 suppository 3   levothyroxine (SYNTHROID, LEVOTHROID) 88 MCG tablet Take 88 mcg by mouth daily before breakfast.     lidocaine (LIDODERM) 5 % Place 1 patch onto the skin every 12 (twelve) hours. Remove & Discard patch within 12 hours or as directed by MD 10 each 71   losartan (COZAAR) 100 MG tablet Take 100 mg by mouth daily. as directed  3   metoprolol succinate (TOPROL-XL) 50 MG 24 hr tablet Take 1 tablet (50 mg total) by mouth daily. 90 tablet 3   ondansetron (ZOFRAN-ODT) 4 MG disintegrating tablet Take 1 tablet (4 mg total) by mouth every 8 (eight) hours as needed for nausea or vomiting. 10 tablet 0   rizatriptan (MAXALT-MLT) 10 MG disintegrating tablet Take 1 tablet (10 mg total) by mouth as needed for migraine. May repeat in 2 hours if needed 10 tablet 5   rosuvastatin (CRESTOR) 10 MG tablet Take 10 mg by mouth daily.     No current facility-administered medications on file prior to visit.    Allergies  Allergen Reactions   Elemental Sulfur Rash    MAKES PT RED    Past Medical History:  Diagnosis Date   Adenomatous colon polyp    Allergic rhinitis    Animal bite 05/2015   fox    Anxiety    Arthritis    shoulder   Carpal tunnel syndrome    RIGHT HAND   Depression    Diastolic dysfunction    a. 2D echo 09/08/15: EF 55-60%, grade 1  DD, elevated LVEDP, mild MR, mild TR. - normal BNP 08/2015.   Essential hypertension    GERD (gastroesophageal reflux disease)    Headache  Heart murmur    Hepatic steatosis    Hyperlipidemia    Hypothyroidism    LBBB (left bundle branch block)    a. negative nuclear stress test 06/2011.   Migraines    Obesity    OSA (obstructive sleep apnea)    mild    Past Surgical History:  Procedure Laterality Date   DERMOID CYST  EXCISION     OF HER RIGHT OVARY. SIZE OF A GRAPEFRUIT. OVARY IS STILL THERE.   HEMITHYROIDECTOMY     RUPTURE BELLY BUTTON     WHEN SHE WAS AN INFANT   TONSILECTOMY, ADENOIDECTOMY, BILATERAL MYRINGOTOMY AND TUBES     AGE 74, pt states she didn't have tubes put into her ears    Social History   Tobacco Use  Smoking Status Never  Smokeless Tobacco Never    Social History   Substance and Sexual Activity  Alcohol Use No    Family History  Problem Relation Age of Onset   Neuropathy Mother    Parkinsonism Mother    Ovarian cancer Mother        dx. 71-59; TAH-BSO at 61   Diabetes Mother    Pancreatic cancer Mother 4       d. 24   Skin cancer Mother        maybe basal cell carcinoma   Obesity Mother    Heart disease Father    Bladder Cancer Father        dx 89-81   Skin cancer Father        possibly melanoma; +sun exposure   Cancer Brother 40       salivary gland cancer; d. 32; no tobacco use   Breast cancer Maternal Aunt        dx unspecified age; s/p mastectomy   Other Maternal Aunt        intellectual disabilities   Heart failure Maternal Aunt 86       acute diastolic heart failure   Colon cancer Maternal Uncle        dx late 23s; d. early 70s   Heart attack Maternal Uncle 38   Cancer Paternal Uncle        CLL, dx. 31   Ovarian cancer Maternal Grandmother        d. 90; w/ mets   Heart attack Maternal Grandfather        d. 72   Esophageal cancer Neg Hx    Rectal cancer Neg Hx    Stomach cancer Neg Hx     Reviw of Systems:  Noted  in current history, all other systems are negative.Marland Kitchen    Physical Exam: There were no vitals taken for this visit.  No BP recorded.  {Refresh Note OR Click here to enter BP  :1}***    GEN:  Well nourished, well developed in no acute distress HEENT: Normal NECK: No JVD; No carotid bruits LYMPHATICS: No lymphadenopathy CARDIAC: RRR ***, no murmurs, rubs, gallops RESPIRATORY:  Clear to auscultation without rales, wheezing or rhonchi  ABDOMEN: Soft, non-tender, non-distended MUSCULOSKELETAL:  No edema; No deformity  SKIN: Warm and dry NEUROLOGIC:  Alert and oriented x 3   ECG:   .  Assessment / Plan:   1. Left bundle branch block-         2. Hypothyroidism-       3. Hyperlipidemia - -    4. Hypertension -     5.  Palpitations:      5. Dyspnea on  exertion-     7.  Morbid obesity:   advised weight loss   Follow up with Christen Bame, NP. Richardson Dopp or [place], [place]    Mertie Moores, MD  01/22/2023 6:24 AM    [address],  [place], [place]  [address] Pager [phone] Phone: [phone]; Fax: [phone]

## 2023-01-23 ENCOUNTER — Encounter: Payer: Self-pay | Admitting: Cardiovascular Disease

## 2023-01-23 ENCOUNTER — Ambulatory Visit (HOSPITAL_COMMUNITY): Payer: BC Managed Care – PPO | Attending: Cardiology

## 2023-01-23 ENCOUNTER — Ambulatory Visit: Payer: BC Managed Care – PPO | Attending: Cardiovascular Disease | Admitting: Cardiovascular Disease

## 2023-01-23 ENCOUNTER — Ambulatory Visit: Payer: BC Managed Care – PPO | Admitting: Neurology

## 2023-01-23 VITALS — BP 130/76 | HR 78 | Ht 64.5 in | Wt 255.8 lb

## 2023-01-23 DIAGNOSIS — I5189 Other ill-defined heart diseases: Secondary | ICD-10-CM

## 2023-01-23 DIAGNOSIS — E669 Obesity, unspecified: Secondary | ICD-10-CM | POA: Diagnosis not present

## 2023-01-23 DIAGNOSIS — I447 Left bundle-branch block, unspecified: Secondary | ICD-10-CM

## 2023-01-23 DIAGNOSIS — I10 Essential (primary) hypertension: Secondary | ICD-10-CM | POA: Diagnosis not present

## 2023-01-23 LAB — ECHOCARDIOGRAM COMPLETE
Area-P 1/2: 3.72 cm2
S' Lateral: 2.7 cm

## 2023-01-23 NOTE — Patient Instructions (Signed)
Medication Instructions:   Your physician recommends that you continue on your current medications as directed. Please refer to the Current Medication list given to you today.  *If you need a refill on your cardiac medications before your next appointment, please call your pharmacy*   You have been referred to [facility] TO CONTACT YOU BY PHONE TO MAKE THIS APPOINTMENT    Follow-Up: At [facility], you and your health needs are our priority.  As part of our continuing mission to provide you with exceptional heart care, we have created designated Provider Care Teams.  These Care Teams include your primary Cardiologist (physician) and Advanced Practice Providers (APPs -  Physician Assistants and Nurse Practitioners) who all work together to provide you with the care you need, when you need it.  We recommend signing up for the patient portal called "MyChart".  Sign up information is provided on this After Visit Summary.  MyChart is used to connect with patients for Virtual Visits (Telemedicine).  Patients are able to view lab/test results, encounter notes, upcoming appointments, etc.  Non-urgent messages can be sent to your provider as well.   To learn more about what you can do with MyChart, go to [email or web address].    Your next appointment:   1 year(s)  Provider:   Mertie Moores, MD

## 2023-01-24 NOTE — Telephone Encounter (Signed)
Spoke to patient she will stop the Gabapentin but she refused to start an injection at this time.

## 2023-01-28 DIAGNOSIS — R051 Acute cough: Secondary | ICD-10-CM | POA: Diagnosis not present

## 2023-01-28 DIAGNOSIS — R0981 Nasal congestion: Secondary | ICD-10-CM | POA: Diagnosis not present

## 2023-02-07 ENCOUNTER — Telehealth: Payer: Self-pay | Admitting: Cardiovascular Disease

## 2023-02-07 NOTE — Telephone Encounter (Signed)
Patient has concerns about not having labs done on her last OV. Please advise

## 2023-02-07 NOTE — Telephone Encounter (Signed)
Pt was seen in clinic on 2/28 and noticed that Dr. Cathie Olden did not order any labs. Pt stated he always orders labs with her yearly f/u and wanted to clarify it MD he meant to order labs. Will forward to MD and nurse.

## 2023-02-08 NOTE — Telephone Encounter (Signed)
Returned call to patient to inform her that she had labs done by PCP in October of 2023 and nahser referenced these labs during the visit. He noted also that PCP manages cholesterol, so opted not to do additional labs. Patient is fine with that. States she sees PCP and does labs once to twice a year. No further questions.

## 2023-02-20 DIAGNOSIS — D739 Disease of spleen, unspecified: Secondary | ICD-10-CM | POA: Diagnosis not present

## 2023-02-20 DIAGNOSIS — Q8909 Congenital malformations of spleen: Secondary | ICD-10-CM | POA: Diagnosis not present

## 2023-02-25 DIAGNOSIS — L298 Other pruritus: Secondary | ICD-10-CM | POA: Diagnosis not present

## 2023-02-25 DIAGNOSIS — L239 Allergic contact dermatitis, unspecified cause: Secondary | ICD-10-CM | POA: Diagnosis not present

## 2023-02-26 DIAGNOSIS — G43009 Migraine without aura, not intractable, without status migrainosus: Secondary | ICD-10-CM | POA: Diagnosis not present

## 2023-02-26 DIAGNOSIS — I10 Essential (primary) hypertension: Secondary | ICD-10-CM | POA: Diagnosis not present

## 2023-02-26 DIAGNOSIS — E785 Hyperlipidemia, unspecified: Secondary | ICD-10-CM | POA: Diagnosis not present

## 2023-03-07 DIAGNOSIS — Z01419 Encounter for gynecological examination (general) (routine) without abnormal findings: Secondary | ICD-10-CM | POA: Diagnosis not present

## 2023-03-07 DIAGNOSIS — Z124 Encounter for screening for malignant neoplasm of cervix: Secondary | ICD-10-CM | POA: Diagnosis not present

## 2023-04-19 DIAGNOSIS — L814 Other melanin hyperpigmentation: Secondary | ICD-10-CM | POA: Diagnosis not present

## 2023-04-19 DIAGNOSIS — L718 Other rosacea: Secondary | ICD-10-CM | POA: Diagnosis not present

## 2023-04-19 DIAGNOSIS — L821 Other seborrheic keratosis: Secondary | ICD-10-CM | POA: Diagnosis not present

## 2023-04-19 DIAGNOSIS — D225 Melanocytic nevi of trunk: Secondary | ICD-10-CM | POA: Diagnosis not present

## 2023-05-07 DIAGNOSIS — E7800 Pure hypercholesterolemia, unspecified: Secondary | ICD-10-CM | POA: Diagnosis not present

## 2023-05-07 DIAGNOSIS — I10 Essential (primary) hypertension: Secondary | ICD-10-CM | POA: Diagnosis not present

## 2023-05-14 DIAGNOSIS — E7800 Pure hypercholesterolemia, unspecified: Secondary | ICD-10-CM | POA: Diagnosis not present

## 2023-05-14 DIAGNOSIS — I10 Essential (primary) hypertension: Secondary | ICD-10-CM | POA: Diagnosis not present

## 2023-06-05 ENCOUNTER — Ambulatory Visit: Payer: BC Managed Care – PPO | Admitting: Neurology

## 2023-06-20 DIAGNOSIS — D1801 Hemangioma of skin and subcutaneous tissue: Secondary | ICD-10-CM | POA: Diagnosis not present

## 2023-06-20 DIAGNOSIS — L821 Other seborrheic keratosis: Secondary | ICD-10-CM | POA: Diagnosis not present

## 2023-07-10 DIAGNOSIS — E7800 Pure hypercholesterolemia, unspecified: Secondary | ICD-10-CM | POA: Diagnosis not present

## 2023-07-10 DIAGNOSIS — I10 Essential (primary) hypertension: Secondary | ICD-10-CM | POA: Diagnosis not present

## 2023-07-10 LAB — LIPID PANEL
Cholesterol: 205 — AB (ref 0–200)
HDL: 66 (ref 35–70)
LDL Cholesterol: 115
Triglycerides: 140 (ref 40–160)

## 2023-08-15 DIAGNOSIS — Z1231 Encounter for screening mammogram for malignant neoplasm of breast: Secondary | ICD-10-CM | POA: Diagnosis not present

## 2023-08-17 IMAGING — US US THYROID
1 series · 12 of 25 positions shown · non-contrast
Comparison: 10/27/2020; 10/07/2019

CLINICAL DATA: Prior ultrasound follow-up. Follow-up thyroid
nodules. History of right thyroid lobectomy.

EXAM:
THYROID ULTRASOUND
TECHNIQUE: Ultrasound examination of the thyroid gland and adjacent soft
tissues was performed.

[Series 1: us thyroid · 0.05mm/px · 12 of 43 slices shown]
[im 2/43]
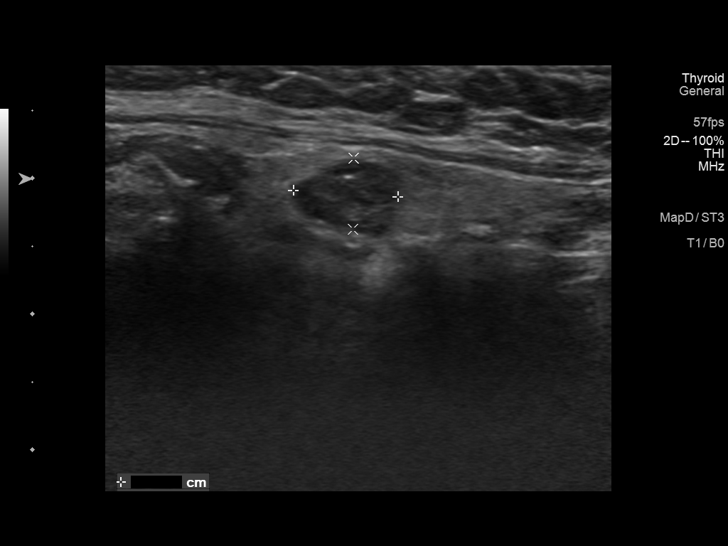
[im 6/43]
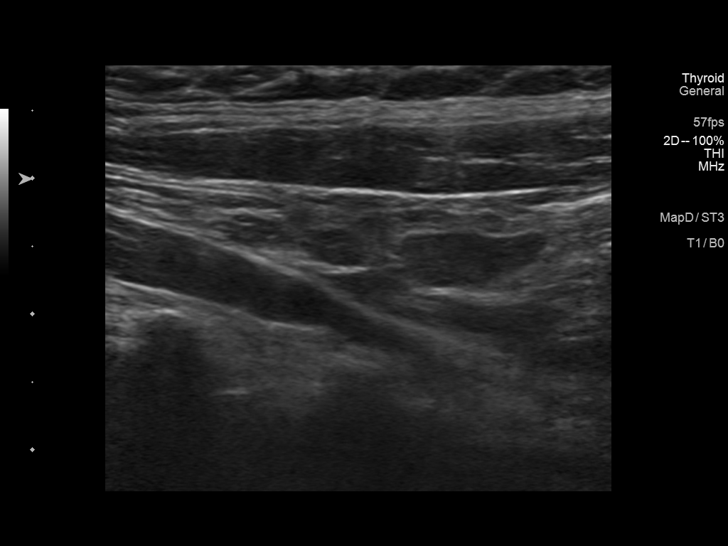
[im 9/43]
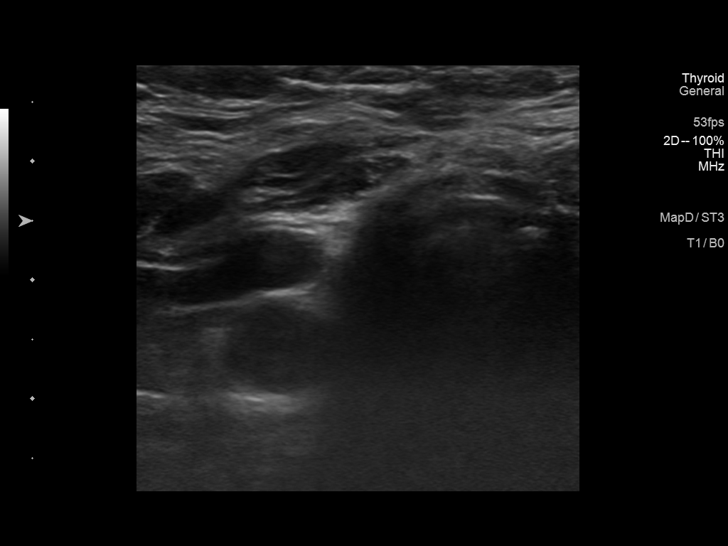
[im 13/43]
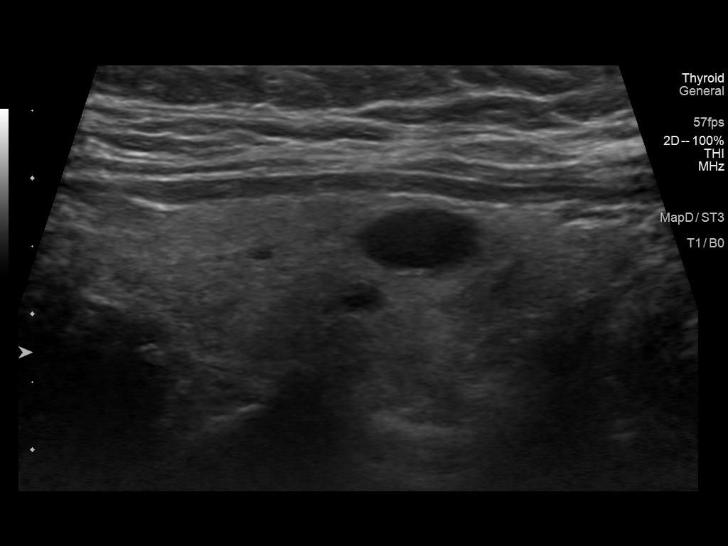
[im 16/43]
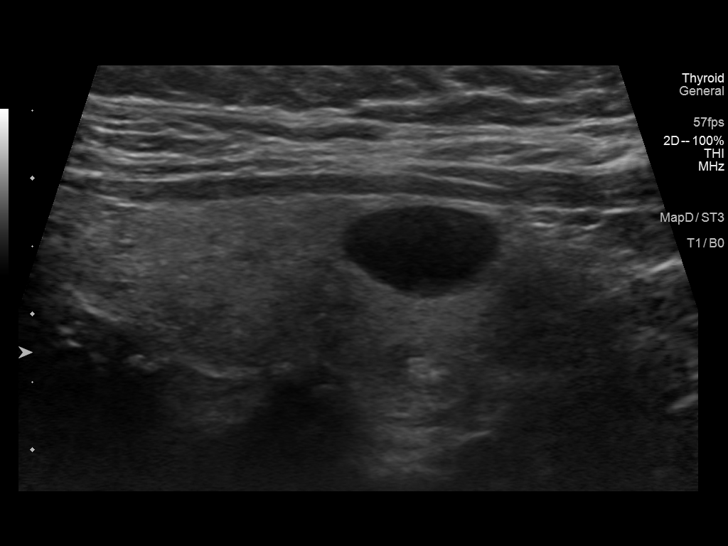
[im 20/43]
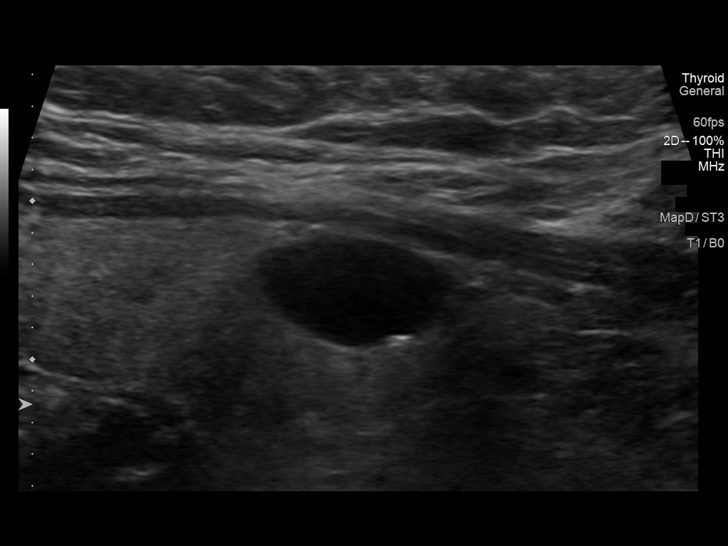
[im 23/43]
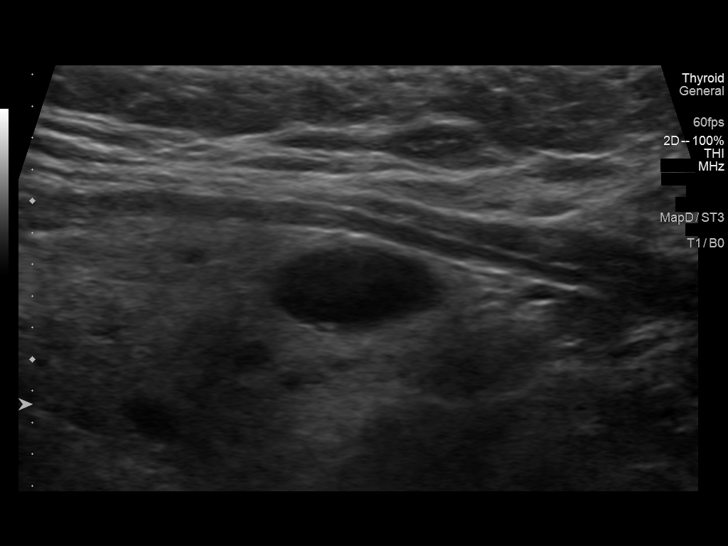
[im 27/43]
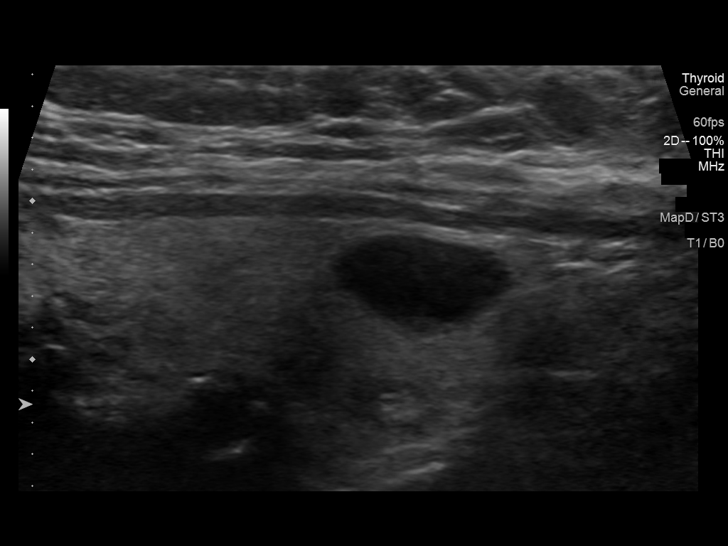
[im 30/43]
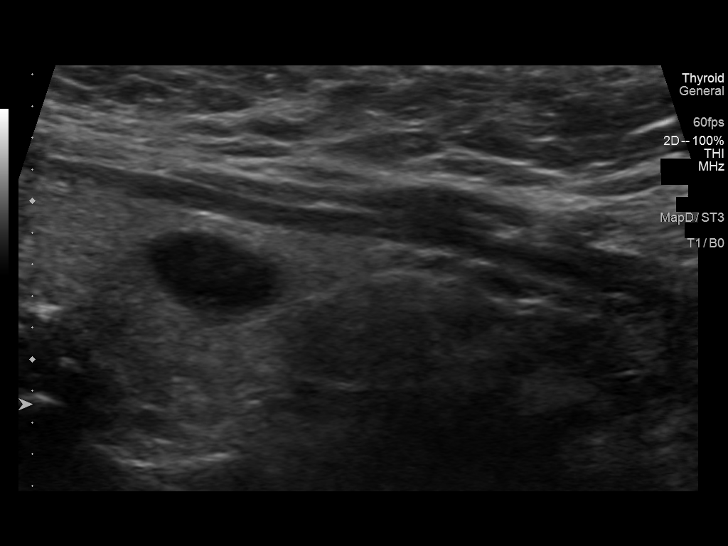
[im 34/43]
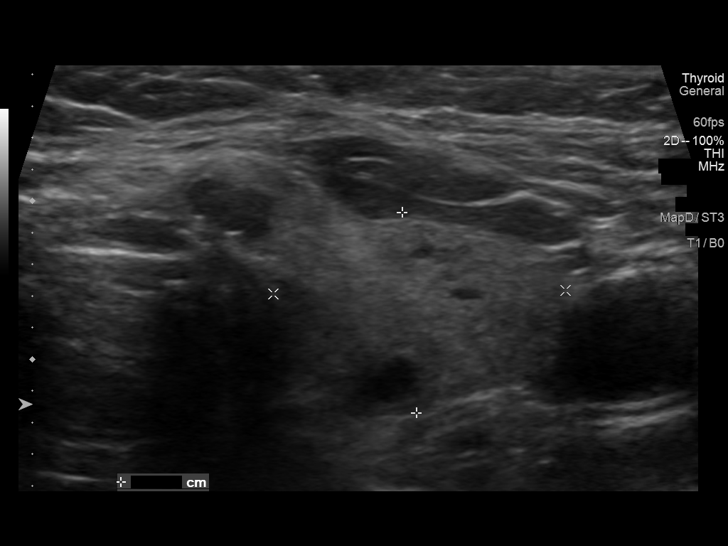
[im 37/43]
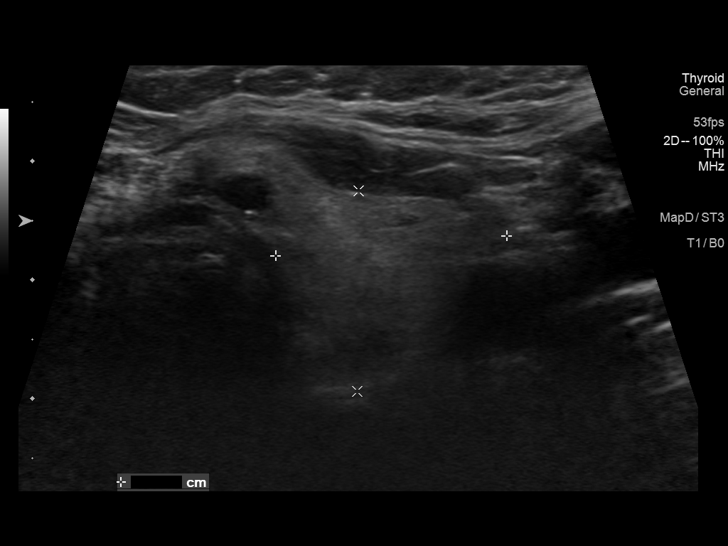
[im 41/43]
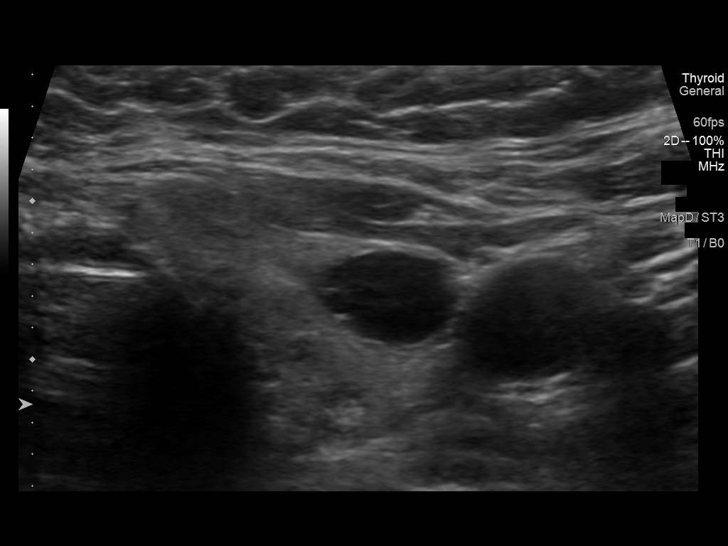

[12 of 25 positions shown; findings below may reference images not displayed]

FINDINGS: Parenchymal Echotexture: Mildly heterogenous

Isthmus: Normal in size measuring 0.3 cm in diameter, unchanged

Right lobe: Surgically absent. There is no residual nodular soft
tissue within the right lobectomy resection bed.

Left lobe: Normal in size measuring 4.6 x 2.0 x 1.7 cm, previously
4.1 x 1.7 x 1.5 cm.

_________________________________________________________

Estimated total number of nodules >/= 1 cm: 2

Number of spongiform nodules >/=  2 cm not described below (TR1): 0

Number of mixed cystic and solid nodules >/= 1.5 cm not described
below (TR2): 0

_________________________________________________________

There is an approximately 0.9 x 0.8 x 0.5 cm hypoechoic nodule in
the left side of the thyroid isthmus (labeled 1), which is unchanged
in size compared to the [DATE] examination and again does not meet
criteria to recommend percutaneous sampling or continued dedicated
follow-up.

_________________________________________________________

Nodule # 2:

Prior biopsy: No

Location: Left; Mid

Maximum size: 1.3 cm; Other 2 dimensions: 0.9 x 0.8 cm, previously,
1.0 x 1.0 x 0.6 cm

Composition: cystic/almost completely cystic (0)

Echogenicity: anechoic (0)

Shape: not taller-than-wide (0)

Margins: smooth (0)

Echogenic foci: large comet-tail artifacts (0)

ACR TI-RADS total points: 0.

ACR TI-RADS risk category:  TR1 (0-1 points).

Significant change in size (>/= 20% in two dimensions and minimal
increase of 2 mm): Yes

Change in features: Yes - nodule now appears anechoic/cystic,
previously, hypoechoic

Change in ACR TI-RADS risk category: Yes - previously characterized
as a TR4 nodule, currently a TR1 nodule

ACR TI-RADS recommendations:

This nodule does NOT meet TI-RADS criteria for biopsy or dedicated
follow-up.

________________________________________________________

There is an approximately 0.6 cm isoechoic ill-defined
nodule/pseudonodule within the inferior, posterior aspect of the
left lobe of the thyroid (labeled 3), not seen on previous
examinations, though does not meet criteria to recommend
percutaneous sampling or continued dedicated follow-up.

Questioned 1.5 cm hypoechoic nodule within the inferior pole of the
left lobe of the thyroid (labeled 4), is favored to represent a
pseudonodule as it lacks defined borders on both provided transverse
and sagittal images.
IMPRESSION: 1. Post right thyroid lobectomy without evidence of locally
recurrent or residual disease/tissue.
2. Nodule labeled #2 previously appeared hypoechoic solid, now
anechoic and cystic, and as such has been downgraded from a TR4
nodule to a TR1 nodule and no longer meets imaging criteria to
recommend continued dedicated follow-up.
3. None of the additional discretely measured thyroid
nodules/pseudonodules meet imaging criteria recommend percutaneous
sampling or dedicated follow-up.

The above is in keeping with the ACR TI-RADS recommendations - [HOSPITAL] 4563;[DATE].

## 2023-09-10 ENCOUNTER — Ambulatory Visit: Payer: BC Managed Care – PPO | Admitting: Neurology

## 2023-09-12 DIAGNOSIS — L814 Other melanin hyperpigmentation: Secondary | ICD-10-CM | POA: Diagnosis not present

## 2023-09-12 DIAGNOSIS — L918 Other hypertrophic disorders of the skin: Secondary | ICD-10-CM | POA: Diagnosis not present

## 2023-09-12 DIAGNOSIS — L821 Other seborrheic keratosis: Secondary | ICD-10-CM | POA: Diagnosis not present

## 2023-09-13 DIAGNOSIS — Z23 Encounter for immunization: Secondary | ICD-10-CM | POA: Diagnosis not present

## 2023-09-25 DIAGNOSIS — I10 Essential (primary) hypertension: Secondary | ICD-10-CM | POA: Diagnosis not present

## 2023-09-25 DIAGNOSIS — E7800 Pure hypercholesterolemia, unspecified: Secondary | ICD-10-CM | POA: Diagnosis not present

## 2023-09-26 ENCOUNTER — Encounter (INDEPENDENT_AMBULATORY_CARE_PROVIDER_SITE_OTHER): Payer: Self-pay | Admitting: Adult Health

## 2023-09-26 ENCOUNTER — Ambulatory Visit (INDEPENDENT_AMBULATORY_CARE_PROVIDER_SITE_OTHER): Payer: PPO | Admitting: Adult Health

## 2023-09-26 VITALS — BP 142/83 | HR 62 | Temp 98.5°F | Ht 63.0 in | Wt 261.0 lb

## 2023-09-26 DIAGNOSIS — Z0289 Encounter for other administrative examinations: Secondary | ICD-10-CM

## 2023-09-26 DIAGNOSIS — E669 Obesity, unspecified: Secondary | ICD-10-CM

## 2023-09-26 DIAGNOSIS — E559 Vitamin D deficiency, unspecified: Secondary | ICD-10-CM | POA: Diagnosis not present

## 2023-09-26 DIAGNOSIS — Z0000 Encounter for general adult medical examination without abnormal findings: Secondary | ICD-10-CM

## 2023-09-26 DIAGNOSIS — Z6841 Body Mass Index (BMI) 40.0 and over, adult: Secondary | ICD-10-CM | POA: Diagnosis not present

## 2023-09-26 NOTE — Progress Notes (Signed)
Office: [phone]  /  Fax: [phone]   Initial Visit  Carolyn Mcguire was seen in clinic today to evaluate for obesity. She is interested in losing weight to improve overall health and reduce the risk of weight related complications. She presents today to review program treatment options, initial physical assessment, and evaluation.     She was referred by: Specialist  When asked what else they would like to accomplish? She states: Adopt healthier eating patterns, Improve energy levels and physical activity, Improve existing medical conditions, Reduce number of medications, and Lose a target amount of weight : Interval goal lost 25 lbs, Ultimate goal- loss down to 175 lbs (last time she was at this weight she was age 76).  Weight history: She states "I was the heaviest child in grade school".  She reports significant weight gain since her mid 18s.  When asked how has your weight affected you? She states: Contributed to medical problems, Contributed to orthopedic problems or mobility issues, Having fatigue, and Having poor endurance  Some associated conditions: Hypertension and Hyperlipidemia  Contributing factors: Family history, Nutritional, Reduced physical activity, and Eating patterns  Weight promoting medications identified: None  Current nutrition plan: None  Current level of physical activity: Walking  Current or previous pharmacotherapy: None  Response to medication: Never tried medications   Past medical history includes:   Past Medical History:  Diagnosis Date   Adenomatous colon polyp    Allergic rhinitis    Animal bite 05/2015   fox    Anxiety    Arthritis    shoulder   Carpal tunnel syndrome    RIGHT HAND   Depression    Diastolic dysfunction    a. 2D echo 09/08/15: EF 55-60%, grade 1 DD, elevated LVEDP, mild MR, mild TR. - normal BNP 08/2015.   Essential hypertension    GERD (gastroesophageal reflux disease)    Headache    Heart murmur    Hepatic  steatosis    Hyperlipidemia    Hypothyroidism    LBBB (left bundle branch block)    a. negative nuclear stress test 06/2011.   Migraines    Obesity    OSA (obstructive sleep apnea)    mild     Objective:   BP (!) 142/83   Pulse 62   Temp 98.5 F (36.9 C)   Ht 5\' 3"  (1.6 m)   Wt 261 lb (118.4 kg)   SpO2 98%   BMI 46.23 kg/m  She was weighed on the bioimpedance scale: Body mass index is 46.23 kg/m.  Peak Weight:265 , Body Fat%:52.9, Visceral Fat Rating:20, Weight trend over the last 12 months: Increasing  General:  Alert, oriented and cooperative. Patient is in no acute distress.  Respiratory: Normal respiratory effort, no problems with respiration noted   Gait: able to ambulate independently  Mental Status: Normal mood and affect. Normal behavior. Normal judgment and thought content.   DIAGNOSTIC DATA REVIEWED:  BMET    Component Value Date/Time   NA 141 09/12/2022 0801   NA 146 (H) 02/18/2020 1357   K 3.8 09/12/2022 0801   CL 106 09/12/2022 0801   CO2 25 09/12/2022 0801   GLUCOSE 131 (H) 09/12/2022 0801   BUN 16 09/12/2022 0801   BUN 11 02/18/2020 1357   CREATININE 0.73 09/12/2022 0801   CREATININE 0.84 08/02/2016 1127   CALCIUM 9.2 09/12/2022 0801   GFRNONAA >60 09/12/2022 0801   GFRAA 76 02/18/2020 1357   No results found for: "HGBA1C" No  results found for: "INSULIN" CBC    Component Value Date/Time   WBC 8.1 09/12/2022 0801   RBC 4.84 09/12/2022 0801   HGB 14.2 09/12/2022 0801   HCT 42.8 09/12/2022 0801   PLT 242 09/12/2022 0801   MCV 88.4 09/12/2022 0801   MCH 29.3 09/12/2022 0801   MCHC 33.2 09/12/2022 0801   RDW 12.4 09/12/2022 0801   Iron/TIBC/Ferritin/ %Sat No results found for: "IRON", "TIBC", "FERRITIN", "IRONPCTSAT" Lipid Panel  No results found for: "CHOL", "TRIG", "HDL", "CHOLHDL", "VLDL", "LDLCALC", "LDLDIRECT" Hepatic Function Panel     Component Value Date/Time   PROT 7.7 09/12/2022 0801   ALBUMIN 4.3 09/12/2022 0801   AST 29  09/12/2022 0801   ALT 22 09/12/2022 0801   ALKPHOS 74 09/12/2022 0801   BILITOT 1.3 (H) 09/12/2022 0801   BILIDIR 0.2 09/12/2022 0801   IBILI 1.1 (H) 09/12/2022 0801      Component Value Date/Time   TSH 1.267 09/16/2015 1026     Assessment and Plan:   Vitamin D deficiency  Healthcare maintenance  ESTABLISH WITH HWW   Obesity Treatment / Action Plan:  Patient will work on garnering support from family and friends to begin weight loss journey. Will work on eliminating or reducing the presence of highly palatable, calorie dense foods in the home. Will complete provided nutritional and psychosocial assessment questionnaire before the next appointment. Will be scheduled for indirect calorimetry to determine resting energy expenditure in a fasting state.  This will allow [place] to create a reduced calorie, high-protein meal plan to promote loss of fat mass while preserving muscle mass. Counseled on the health benefits of losing 5%-15% of total body weight. Was counseled on nutritional approaches to weight loss and benefits of reducing processed foods and consuming plant-based foods and high quality protein as part of nutritional weight management. Was counseled on pharmacotherapy and role as an adjunct in weight management.   Obesity Education Performed Today:  She was weighed on the bioimpedance scale and results were discussed and documented in the synopsis.  We discussed obesity as a disease and the importance of a more detailed evaluation of all the factors contributing to the disease.  We discussed the importance of long term lifestyle changes which include nutrition, exercise and behavioral modifications as well as the importance of customizing this to her specific health and social needs.  We discussed the benefits of reaching a healthier weight to alleviate the symptoms of existing conditions and reduce the risks of the biomechanical, metabolic and psychological effects of  obesity.  Carolyn Mcguire appears to be in the action stage of change and states they are ready to start intensive lifestyle modifications and behavioral modifications.  30 minutes was spent today on this visit including the above counseling, pre-visit chart review, and post-visit documentation.  Reviewed by clinician on day of visit: allergies, medications, problem list, medical history, surgical history, family history, social history, and previous encounter notes pertinent to obesity diagnosis.   Aylen Stradford d. Judine Arciniega, NP-C

## 2023-09-30 DIAGNOSIS — M2041 Other hammer toe(s) (acquired), right foot: Secondary | ICD-10-CM | POA: Diagnosis not present

## 2023-09-30 DIAGNOSIS — M71571 Other bursitis, not elsewhere classified, right ankle and foot: Secondary | ICD-10-CM | POA: Diagnosis not present

## 2023-10-18 DIAGNOSIS — I10 Essential (primary) hypertension: Secondary | ICD-10-CM | POA: Diagnosis not present

## 2023-10-18 DIAGNOSIS — E039 Hypothyroidism, unspecified: Secondary | ICD-10-CM | POA: Diagnosis not present

## 2023-10-18 DIAGNOSIS — Z0000 Encounter for general adult medical examination without abnormal findings: Secondary | ICD-10-CM | POA: Diagnosis not present

## 2023-10-18 LAB — BASIC METABOLIC PANEL
BUN: 14 (ref 4–21)
CO2: 22 (ref 13–22)
Chloride: 104 (ref 99–108)
Creatinine: 0.9 (ref 0.5–1.1)
Glucose: 111
Potassium: 4 meq/L (ref 3.5–5.1)
Sodium: 144 (ref 137–147)

## 2023-10-18 LAB — CBC AND DIFFERENTIAL
HCT: 42 (ref 36–46)
Hemoglobin: 13.9 (ref 12.0–16.0)
Neutrophils Absolute: 5.1
Platelets: 239 10*3/uL (ref 150–400)
WBC: 7.3

## 2023-10-18 LAB — COMPREHENSIVE METABOLIC PANEL
Albumin: 4.3 (ref 3.5–5.0)
Calcium: 9.3 (ref 8.7–10.7)
Globulin: 2.3
eGFR: 76

## 2023-10-18 LAB — HEPATIC FUNCTION PANEL
ALT: 15 U/L (ref 7–35)
AST: 20 (ref 13–35)
Alkaline Phosphatase: 116 (ref 25–125)
Bilirubin, Total: 0.6

## 2023-10-18 LAB — CBC: RBC: 4.72 (ref 3.87–5.11)

## 2023-10-19 LAB — PROTEIN / CREATININE RATIO, URINE
Albumin, U: 3.5
Creatinine, Urine: 143.2

## 2023-10-19 LAB — MICROALBUMIN / CREATININE URINE RATIO: Microalb Creat Ratio: 2

## 2023-10-19 LAB — LAB REPORT - SCANNED
Albumin, Urine POC: 3.5
Creatinine, POC: 143.2 mg/dL
EGFR: 76
Microalb Creat Ratio: 2

## 2023-10-23 ENCOUNTER — Ambulatory Visit (HOSPITAL_COMMUNITY)
Admission: RE | Admit: 2023-10-23 | Discharge: 2023-10-23 | Disposition: A | Discharge status: Home / Self Care | Payer: BC Managed Care – PPO | Source: Ambulatory Visit | Attending: Family Medicine | Admitting: Family Medicine

## 2023-10-23 ENCOUNTER — Telehealth (INDEPENDENT_AMBULATORY_CARE_PROVIDER_SITE_OTHER): Payer: Self-pay | Admitting: Family Medicine

## 2023-10-23 ENCOUNTER — Other Ambulatory Visit: Payer: Self-pay

## 2023-10-23 ENCOUNTER — Encounter (INDEPENDENT_AMBULATORY_CARE_PROVIDER_SITE_OTHER): Payer: Self-pay | Admitting: Family Medicine

## 2023-10-23 ENCOUNTER — Ambulatory Visit (INDEPENDENT_AMBULATORY_CARE_PROVIDER_SITE_OTHER): Payer: PPO | Admitting: Family Medicine

## 2023-10-23 VITALS — BP 149/75 | HR 65 | Temp 98.0°F | Ht 64.0 in | Wt 256.0 lb

## 2023-10-23 DIAGNOSIS — F39 Unspecified mood [affective] disorder: Secondary | ICD-10-CM

## 2023-10-23 DIAGNOSIS — F5089 Other specified eating disorder: Secondary | ICD-10-CM | POA: Diagnosis not present

## 2023-10-23 DIAGNOSIS — E559 Vitamin D deficiency, unspecified: Secondary | ICD-10-CM

## 2023-10-23 DIAGNOSIS — E669 Obesity, unspecified: Secondary | ICD-10-CM | POA: Diagnosis not present

## 2023-10-23 DIAGNOSIS — Z6841 Body Mass Index (BMI) 40.0 and over, adult: Secondary | ICD-10-CM | POA: Diagnosis not present

## 2023-10-23 DIAGNOSIS — Z1331 Encounter for screening for depression: Secondary | ICD-10-CM

## 2023-10-23 DIAGNOSIS — E039 Hypothyroidism, unspecified: Secondary | ICD-10-CM | POA: Diagnosis not present

## 2023-10-23 DIAGNOSIS — I10 Essential (primary) hypertension: Secondary | ICD-10-CM | POA: Diagnosis not present

## 2023-10-23 DIAGNOSIS — R0602 Shortness of breath: Secondary | ICD-10-CM | POA: Diagnosis not present

## 2023-10-23 DIAGNOSIS — R9431 Abnormal electrocardiogram [ECG] [EKG]: Secondary | ICD-10-CM | POA: Insufficient documentation

## 2023-10-23 DIAGNOSIS — R5383 Other fatigue: Secondary | ICD-10-CM

## 2023-10-23 DIAGNOSIS — I447 Left bundle-branch block, unspecified: Secondary | ICD-10-CM | POA: Diagnosis not present

## 2023-10-23 DIAGNOSIS — E785 Hyperlipidemia, unspecified: Secondary | ICD-10-CM | POA: Diagnosis not present

## 2023-10-23 NOTE — Telephone Encounter (Signed)
Pt is calling in stating that she had a bad experience with having her labs done today in the office.  Stated that she was stuck in both arms, but the L arm she experienced the worst pain.  The pain in the L arm was very painful and it radiated up to her shoulder. And wanted to let us know that she do not want to keep going through the pain each time that she comes in.  Pt would like to have a call back.    When pt was checking out from her appointment today she made mention that she had got stuck twice and showed both arms (holding them high in the air so that we could see) but no mention of pain at that time.  Pt also mention that she had already had labs done at her PCP and did not know why she should have more labs done.  A question was asked of her did you inform your provider here when you were back there to let her know that you had labs done? She stated yes. And wanted to know if she could have the lab results from her PCP faxed to [place] and she was given the fax number.  Patient left wishing [place] a Happy Thanksgiving and acting as if nothing was wrong.  Pt came back to the office after about 1 hour from going to her PCP office to obtain her last lab results she had done at their office. Pt stated that her arms were a little sore as she was taking her bandages off and reaching above her head.  She stated that she always bruise after having her blood work done. And made mention that she may need to put some ice on it when she gets home.  Pt was asked if there was anything that we could do and she stated no just make sure the doctor gets my lab results that I am bringing in.  She was assured that the provider would get them.

## 2023-10-23 NOTE — Progress Notes (Signed)
[name], D.O.  ABFM, ABOM Specializing in Clinical Bariatric Medicine Office located at: [address]  [place], [place]  [address]     Bariatric Medicine Visit  Dear Carolyn Brunette, MD   Thank you for referring Carolyn Mcguire to our clinic today for evaluation.  We performed a consultation to discuss her options for treatment and educate the patient on her disease state.  The following note includes my evaluation and treatment recommendations.   Please do not hesitate to reach out to me directly if you have any further concerns.    Assessment and Plan:   FOR THE DISEASE OF OBESITY: Morbid obesity (HCC)-starting bmi 10/23/23 43.92 Since last office visit on 09/26/2023 patient's  Muscle mass has decreased by 0.2lb. Fat mass has decreased by 5.4lb. Total body water has decreased by 1.8lb.  Counseling done on how various foods will affect these numbers and how to maximize success  Total lbs lost to date: 5 Total weight loss percentage to date: 1.92%   Recommended Dietary Goals Dayli is currently in the action stage of change. As such, her goal is to continue weight management plan. She has agreed to: follow the Category 2 plan - 1200 kcal per day and continue current plan   Behavioral Intervention We discussed the following Behavioral Modification Strategies today: increasing lean protein intake to established goals, increasing vegetables, avoiding skipping meals, identifying sources and decreasing liquid calories, decreasing eating out or consumption of processed foods, and making healthy choices when eating convenient foods, avoiding temptations and identifying enticing environmental cues, continue to practice mindfulness when eating, planning for success, and better snacking choices  Additional resources provided today: None  Evidence-based interventions for health behavior change were utilized today including the discussion of self monitoring techniques,  problem-solving barriers and SMART goal setting techniques.   Regarding patient's less desirable eating habits and patterns, we employed the technique of small changes.   Pt will specifically work on: Beginning prescribed meal plan for next visit.   Recommended Physical Activity Goals Carolyn Mcguire has been advised to work up to 150 minutes of moderate intensity aerobic activity a week and strengthening exercises 2-3 times per week for cardiovascular health, weight loss maintenance and preservation of muscle mass.   She has agreed to :  Think about enjoyable ways to increase daily physical activity and overcoming barriers to exercise and Increase physical activity in their day and reduce sedentary time (increase NEAT).   Pharmacotherapy We discussed various medication options to help Carolyn Mcguire with her weight loss efforts and we both agreed to : continue with nutritional and behavioral strategies   FOR ASSOCIATED CONDITIONS ADDRESSED TODAY: Fatigue Assessment:  Carolyn Mcguire does feel that her weight is causing her energy to be lower than it should be. Fatigue may be related to obesity, depression or many other causes. she does not appear to have any red flag symptoms and this appears to most likely be related to her current lifestyle habits and dietary intake.  Plan:  Labs will be ordered and reviewed with her at their next office visit in two weeks. Other fatigue -     CBC with Differential/Platelet -     VITAMIN D 25 Hydroxy (Vit-D Deficiency, Fractures) -     Comprehensive metabolic panel -     Folate -     Hemoglobin A1c -     Insulin, random -     Lipid Panel With LDL/HDL Ratio -     T4, free -  TSH -     Vitamin B12 -     EKG 12-Lead  Epworth sleepiness scale score appears to be within normal limits.  Her ESS score is 4.   Carolyn Mcguire denies daytime somnolence and admits to waking up still tired. Patient has a history of symptoms of hypertension. Carolyn Mcguire generally gets  4-6  hours of sleep per  night, and states that she has generally restful sleep. Snoring is present. Apneic episodes are not present.   ECG: Performed and reviewed/ interpreted independently.  Normal sinus rhythm, rate 70bpm; LBBB block little to no change from 09/16/2023, reassuring without any acute abnormalities, will continue to monitor for symptoms   Modified PHQ-9 Depression Screen: Her Food and Mood (modified PHQ-9) score was 19.   Shortness of breath on exertion Assessment:  Carolyn Mcguire does feel that she gets out of breath more easily than she used to when she exercises and seems to be worsening over time with weight gain.  This has gotten worse recently. Carolyn Mcguire denies shortness of breath at rest or orthopnea. Carolyn Mcguire's shortness of breath appears to be obesity related and exercise induced, as they do not appear to have any "red flag" symptoms/ concerns today.  Also, this condition appears to be related to a state of poor cardiovascular conditioning   Plan:  Obtain labs today and will be reviewed with her at their next office visit in two weeks.  Indirect Calorimeter completed today to help guide our dietary regimen. It shows a VO2 of 246 and a REE of 1699.  Her calculated basal metabolic rate is 1610 thus her measured basal metabolic rate is worse than expected.  Patient agreed to work on weight loss at this time.  As Carolyn Mcguire progresses through our weight loss program, we will gradually increase exercise as tolerated to treat her current condition.   If Carolyn Mcguire follows our recommendations and loses 5-10% of their weight without improvement of her shortness of breath or if at any time, symptoms become more concerning, they agree to urgently follow up with their PCP/ specialist for further consideration/ evaluation.   Carolyn Mcguire verbalizes agreement with this plan.    Hypertension, unspecified type Assessment & Plan:  Condition is Not optimized.Marland Kitchen Pt BP is elevated at 149/76 and her BP last OV was also elevated. This is being  treated with Norvasc, Toprol-XL and Cozaar once daily. Counseled Carolyn Mcguire on pathophysiology of disease and discussed treatment plan, which always includes dietary and lifestyle modification as first line. Blood pressure monitoring encouraged.We will continue to monitor closely alongside PCP/ specialists.  Last 3 blood pressure readings in our office are as follows: BP Readings from Last 3 Encounters:  10/23/23 (!) 149/75  09/26/23 (!) 142/83  01/23/23 130/76  Hypertension, unspecified type -     Comprehensive metabolic panel   Hyperlipidemia, unspecified hyperlipidemia type Assessment & Plan:  Condition is Not at goal.. Pt has a hx of HLD that is being treated with Crestor daily. She tolerates this medication well and denies any adverse side effects on this. This is being monitored by her PCP. Carolyn Mcguire agrees to continue with meds and begin our treatment plan of a heart-heathy, low cholesterol meal plan.  Hyperlipidemia, unspecified hyperlipidemia type -     Lipid Panel With LDL/HDL Ratio   Vitamin D deficiency Assessment & Plan:  Condition is Not at goal.. Pt informed me that she has a hx of vitamin D deficiency. No vitamin D labs can be found at this time. Pt  does not take any oral supplement or multivitamin for this at this time. I discussed the importance of vitamin D to the patient's health and well-being as well as to their ability to lose weight. We will monitor levels regularly and recheck today to keep levels within normal limits and prevent over supplementation.   Hypothyroidism, unspecified type Assessment & Plan:  Condition is Controlled.. Pt thyroid levels are being well controlled with Synthroid once daily. Pt tolerates this well and has no complaints or adverse side effects on this medication.  Hypothyroidism, unspecified type -     T4, free -     TSH   LBBB (left bundle branch block) Assessment &  Plan; 01/23/2023 her EKG LVEF was 60-65% with normal  LV function, no wall motion abnormalities. Grade 1 LV dystolic function. Pt has received repeat testing at least once a year since 2019.  LBBB (left bundle branch block) -     CBC with Differential/Platelet -     VITAMIN D 25 Hydroxy (Vit-D Deficiency, Fractures) -     Comprehensive metabolic panel -     Folate -     Hemoglobin A1c -     Insulin, random -     Lipid Panel With LDL/HDL Ratio -     T4, free -     TSH -     Vitamin B12   Mood disorder (HCC)- emotional eating Assessment: Condition is Improving, but not optimized.. Denies any SI/HI. Mood is stable. Pt has a hx of depression/anxiety. She was placed on Wellbutrin yrs ago when her dad, brother, and mother passes away. She discontinued this due to toxicities and has not been on any mood mediation in yrs. She tends to eat when stressed, sad, bored, and for comfort. She does feel guilt when she overeats and feels out of control with her eating. She feels at least half the time her extra weight makes her feel depressed, is effecting her sleep, and decrease her interest in activities. She feels as the extra weight has decreased her energy and effects her mood.   Plan:  Patient was referred to Dr. Dewaine Conger, our Bariatric Psychologist, for evaluation due to her elevated PHQ-9 score and significant struggles with emotional eating. She declines to see her at this time and we will revisit this later. Discussed how thoughts affect eating habits, modeling of thoughts, feelings, and behaviors, and strategies for change.  Importance of not skipping meals and getting all her proteins and fiber in on a daily basis discussed. Reminded patient of the importance of following their prudent nutrition plan and how food can affect mood as well to support emotional wellbeing.  Mood disorder (HCC)- emotional eating -     VITAMIN D 25 Hydroxy (Vit-D Deficiency, Fractures) -     T4, free -     TSH   FOLLOW UP:   Follow up in 2 weeks. She was informed of the  importance of frequent follow up visits to maximize her success with intensive lifestyle modifications for her multiple health conditions.  MYSTIE DELHIERRO is aware that we will review all of her lab results at our next visit.  She is aware that if anything is critical/ life threatening with the results, we will be contacting her via MyChart prior to the office visit to discuss management.      Chief Complaint:   OBESITY Carolyn Mcguire (MR# [id number]) is a 67 y.o. female who presents for evaluation and treatment of obesity and  related comorbidities. Current BMI is Body mass index is 43.94 kg/m. Carolyn Mcguire has been struggling with her weight for many years and has been unsuccessful in either losing weight, maintaining weight loss, or reaching her healthy weight goal.  Carolyn Mcguire is currently in the action stage of change and ready to dedicate time achieving and maintaining a healthier weight. Carolyn Mcguire is interested in becoming our patient and working on intensive lifestyle modifications including (but not limited to) diet and exercise for weight loss.  Carolyn Mcguire JIMENEZ is retired. Patient is single and does not have children. She lives with alone.    Pt would like to lose weight to decrease the number of medications she has to take, stabilize blood sugar, enjoy activities, have more energy, and enjoy life. She currently walks 15-29mins 2-3x a wk. She desires to be 240lbs and has followed the weight watchers diet and lost over 22ls over 32yrs ago which she did gain back. She eats out 3-4x a wk and does not like to cook due to cleaning and not knowing what to cook. She tends to crave pizza and sweets, she does not like liver, sauerkraut, or brussel sprouts and consider herself a picky eater. Pt tends to snack on peanuts, chips, cheese, and crackers. She typically eats 2 meals a day brunch and dinner. She drinks diet soda and unsweet tea. She feels as her worst food habit is boredom eating  and struggles with portion control. She tends to eat quickly and too much she was recommended to decrease her sugar, salt, and carb intake by her PCP.    Subjective:   This is the patient's first visit at [facility].  The patient's NEW PATIENT PACKET that they filled out prior to today's office visit was reviewed at length and information from that paperwork was included within the following office visit note.    Included in the packet: current and past health history, medications, allergies, ROS, gynecologic history (women only), surgical history, family history, social history, weight history, weight loss surgery history (for those that have had weight loss surgery), nutritional evaluation, mood and food questionnaire along with a depression screening (PHQ9) on all patients, an Epworth questionnaire, sleep habits questionnaire, patient life and health improvement goals questionnaire. These will all be scanned into the patient's chart under the "media" tab.   Review of Systems: Please refer to new patient packet scanned into media. Pertinent positives were addressed with patient today.  Reviewed by clinician on day of visit: allergies, medications, problem list, medical history, surgical history, family history, social history, and previous encounter notes.  During the visit, I independently reviewed the patient's EKG, bioimpedance scale results, and indirect calorimeter results. I used this information to tailor a meal plan for the patient that will help Carolyn Mcguire to lose weight and will improve her obesity-related conditions going forward.  I performed a medically necessary appropriate examination and/or evaluation. I discussed the assessment and treatment plan with the patient. The patient was provided an opportunity to ask questions and all were answered. The patient agreed with the plan and demonstrated an understanding of the instructions. Labs were ordered today (unless  patient declined them) and will be reviewed with the patient at our next visit unless more critical results need to be addressed immediately. Clinical information was updated and documented in the EMR.    Objective:   PHYSICAL EXAM: Blood pressure (!) 149/75, pulse 65, temperature 98 F (36.7 C), height  5\' 4"  (1.626 m), weight 256 lb (116.1 kg), SpO2 97%. Body mass index is 43.94 kg/m.  General: Well Developed, well nourished, and in no acute distress.  HEENT: Normocephalic, atraumatic; EOMI, sclerae are anicteric. Skin: Warm and dry, good turgor Chest:  Normal excursion, shape, no gross ABN Respiratory: No conversational dyspnea; speaking in full sentences NeuroM-Sk:  Normal gross ROM * 4 extremities  Psych: A and O *3, insight adequate, mood- full   Anthropometric Measurements Height: 5\' 4"  (1.626 m) Weight: 256 lb (116.1 kg) BMI (Calculated): 43.92 Weight at Last Visit: na Weight Lost Since Last Visit: na Weight Gained Since Last Visit: na Starting Weight: 256 lb Total Weight Loss (lbs): 0 lb (0 kg) Peak Weight: 265lb Waist Measurement : 53 inches   Body Composition  Body Fat %: 51.9 % Fat Mass (lbs): 133 lbs Muscle Mass (lbs): 117 lbs Total Body Water (lbs): 83.8 lbs Visceral Fat Rating : 18   Other Clinical Data RMR: 1699 Fasting: Yes Labs: Yes Today's Visit #: 1 Starting Date: 10/23/23 Comments: first visit    DIAGNOSTIC DATA REVIEWED:  BMET    Component Value Date/Time   NA 141 09/12/2022 0801   NA 146 (H) 02/18/2020 1357   K 3.8 09/12/2022 0801   CL 106 09/12/2022 0801   CO2 25 09/12/2022 0801   GLUCOSE 131 (H) 09/12/2022 0801   BUN 16 09/12/2022 0801   BUN 11 02/18/2020 1357   CREATININE 0.73 09/12/2022 0801   CREATININE 0.84 08/02/2016 1127   CALCIUM 9.2 09/12/2022 0801   GFRNONAA >60 09/12/2022 0801   GFRAA 76 02/18/2020 1357   No results found for: "HGBA1C" No results found for: "INSULIN" Lab Results  Component Value Date   TSH 1.267  09/16/2015   CBC    Component Value Date/Time   WBC 8.1 09/12/2022 0801   RBC 4.84 09/12/2022 0801   HGB 14.2 09/12/2022 0801   HCT 42.8 09/12/2022 0801   PLT 242 09/12/2022 0801   MCV 88.4 09/12/2022 0801   MCH 29.3 09/12/2022 0801   MCHC 33.2 09/12/2022 0801   RDW 12.4 09/12/2022 0801   Iron Studies No results found for: "IRON", "TIBC", "FERRITIN", "IRONPCTSAT" Lipid Panel  No results found for: "CHOL", "TRIG", "HDL", "CHOLHDL", "VLDL", "LDLCALC", "LDLDIRECT" Hepatic Function Panel     Component Value Date/Time   PROT 7.7 09/12/2022 0801   ALBUMIN 4.3 09/12/2022 0801   AST 29 09/12/2022 0801   ALT 22 09/12/2022 0801   ALKPHOS 74 09/12/2022 0801   BILITOT 1.3 (H) 09/12/2022 0801   BILIDIR 0.2 09/12/2022 0801   IBILI 1.1 (H) 09/12/2022 0801      Component Value Date/Time   TSH 1.267 09/16/2015 1026   Nutritional No results found for: "VD25OH"  Attestation Statements:   I, Clinical biochemist, acting as a Stage manager for [employer], DO., have compiled all relevant documentation for today's office visit on behalf of Thomasene Lot, DO, while in the presence of [employer], DO.  Reviewed by clinician on day of visit: allergies, medications, problem list, medical history, surgical history, family history, social history, and previous encounter notes pertinent to patient's obesity diagnosis. I have spent 60 minutes in the care of the patient today including: preparing to see patient (e.g. review and interpretation of tests, old notes ), obtaining and/or reviewing separately obtained history, performing a medically appropriate examination or evaluation, counseling and educating the patient, ordering medications, test or procedures, documenting clinical information in the electronic or other health care record,  and independently interpreting results and communicating results to the patient, family, or caregiver   I have reviewed the above documentation for accuracy  and completeness, and I agree with the above. [name], D.O.  The 21st Century Cures Act was signed into law in 2016 which includes the topic of electronic health records.  This provides immediate access to information in MyChart.  This includes consultation notes, operative notes, office notes, lab results and pathology reports.  If you have any questions about what you read please let us know at your next visit so we can discuss your concerns and take corrective action if need be.  We are right here with you.

## 2023-10-30 ENCOUNTER — Telehealth (HOSPITAL_BASED_OUTPATIENT_CLINIC_OR_DEPARTMENT_OTHER): Payer: Self-pay

## 2023-10-30 ENCOUNTER — Other Ambulatory Visit (HOSPITAL_BASED_OUTPATIENT_CLINIC_OR_DEPARTMENT_OTHER): Payer: Self-pay | Admitting: Internal Medicine

## 2023-10-30 DIAGNOSIS — Z0000 Encounter for general adult medical examination without abnormal findings: Secondary | ICD-10-CM | POA: Diagnosis not present

## 2023-10-30 DIAGNOSIS — R7303 Prediabetes: Secondary | ICD-10-CM | POA: Diagnosis not present

## 2023-10-30 DIAGNOSIS — D7389 Other diseases of spleen: Secondary | ICD-10-CM | POA: Diagnosis not present

## 2023-10-30 DIAGNOSIS — E785 Hyperlipidemia, unspecified: Secondary | ICD-10-CM | POA: Diagnosis not present

## 2023-10-30 DIAGNOSIS — I8393 Asymptomatic varicose veins of bilateral lower extremities: Secondary | ICD-10-CM | POA: Diagnosis not present

## 2023-10-30 DIAGNOSIS — E7849 Other hyperlipidemia: Secondary | ICD-10-CM

## 2023-10-30 DIAGNOSIS — I10 Essential (primary) hypertension: Secondary | ICD-10-CM | POA: Diagnosis not present

## 2023-10-30 DIAGNOSIS — E041 Nontoxic single thyroid nodule: Secondary | ICD-10-CM | POA: Diagnosis not present

## 2023-10-30 DIAGNOSIS — G43009 Migraine without aura, not intractable, without status migrainosus: Secondary | ICD-10-CM | POA: Diagnosis not present

## 2023-10-30 DIAGNOSIS — E039 Hypothyroidism, unspecified: Secondary | ICD-10-CM | POA: Diagnosis not present

## 2023-10-30 DIAGNOSIS — I447 Left bundle-branch block, unspecified: Secondary | ICD-10-CM | POA: Diagnosis not present

## 2023-10-30 DIAGNOSIS — R0989 Other specified symptoms and signs involving the circulatory and respiratory systems: Secondary | ICD-10-CM | POA: Diagnosis not present

## 2023-10-30 DIAGNOSIS — E559 Vitamin D deficiency, unspecified: Secondary | ICD-10-CM | POA: Diagnosis not present

## 2023-11-04 ENCOUNTER — Telehealth (INDEPENDENT_AMBULATORY_CARE_PROVIDER_SITE_OTHER): Payer: Self-pay | Admitting: Family Medicine

## 2023-11-04 NOTE — Telephone Encounter (Signed)
Pt called wanting to speak with Harriett Sine regarding a billing issue. Pt stated that she spoke with Harriett Sine before. Pt also states she spoke to billing and they could not help her. [facility].

## 2023-11-06 ENCOUNTER — Encounter (INDEPENDENT_AMBULATORY_CARE_PROVIDER_SITE_OTHER): Payer: Self-pay | Admitting: Family Medicine

## 2023-11-06 ENCOUNTER — Ambulatory Visit (INDEPENDENT_AMBULATORY_CARE_PROVIDER_SITE_OTHER): Payer: PPO | Admitting: Family Medicine

## 2023-11-06 VITALS — BP 136/74 | HR 64 | Temp 98.3°F | Ht 64.0 in | Wt 254.0 lb

## 2023-11-06 DIAGNOSIS — F39 Unspecified mood [affective] disorder: Secondary | ICD-10-CM | POA: Diagnosis not present

## 2023-11-06 DIAGNOSIS — E785 Hyperlipidemia, unspecified: Secondary | ICD-10-CM

## 2023-11-06 DIAGNOSIS — I10 Essential (primary) hypertension: Secondary | ICD-10-CM | POA: Diagnosis not present

## 2023-11-06 DIAGNOSIS — E669 Obesity, unspecified: Secondary | ICD-10-CM | POA: Diagnosis not present

## 2023-11-06 DIAGNOSIS — Z6841 Body Mass Index (BMI) 40.0 and over, adult: Secondary | ICD-10-CM | POA: Diagnosis not present

## 2023-11-06 DIAGNOSIS — E66813 Obesity, class 3: Secondary | ICD-10-CM

## 2023-11-06 DIAGNOSIS — E039 Hypothyroidism, unspecified: Secondary | ICD-10-CM

## 2023-11-06 NOTE — Progress Notes (Incomplete)
Carolyn Mcguire, D.O.  ABFM, ABOM Clinical Bariatric Medicine Physician  Office located at: [address] [place], [place]  [address]     Assessment and Plan:   Pt declines A1c, vit d, folate, b12, and fasting insulin today and states she will obtain this with her PCP sometime in the near future  FOR THE DISEASE OF OBESITY: BMI 40.0-44.9, adult (HCC) Obesity, current BMI 43.7 Since last office visit on 10/23/23 patient's muscle mass has increased by 0.6lb. Fat mass has decreased by 2.2lb. Total body water has increased by 0.6lb.  Counseling done on how various foods will affect these numbers and how to maximize success  Total lbs lost to date: 2 lbs Total weight loss percentage to date: -0.78   Reviewed labs from 10/19/23 with patient including: CBC, CMP, TSH. Also had FLP from 07/10/23. Kidney function stable. Unable to view A1C results from labs on 10/19/2023. Pt recalls recent A1c of 6.4, however those results were not part of the results that were brought to [place]. We asked her to bring those records to [place].   I encourage she increase her water intake in an effort to stay properly hydrated on a daily basis.   Recommended Dietary Goals Alexius is currently in the action stage of change. As such, her goal is to continue weight management plan. She has agreed to:  Continue on Category 2 meal plan with added breakfast options   Behavioral Intervention We discussed the following Behavioral Modification Strategies today: increasing water intake , practice mindfulness eating and understand the difference between hunger signals and cravings, work on managing stress, creating time for self-care and relaxation, and celebration eating strategies  Additional resources provided today: Handout on CAT 1-2 breakfast options  Evidence-based interventions for health behavior change were utilized today including the discussion of self monitoring techniques, problem-solving barriers and SMART  goal setting techniques.   Regarding patient's less desirable eating habits and patterns, we employed the technique of small changes.   Pt will specifically work on: Follow her meal plan and increase activity with help of walking at home videos for next visit.   Recommended Physical Activity Goals Mckinzley has been advised to work up to 150 minutes of moderate intensity aerobic activity a week and strengthening exercises 2-3 times per week for cardiovascular health, weight loss maintenance and preservation of muscle mass.   She has agreed to :  Think about enjoyable ways to increase daily physical activity and overcoming barriers to exercise, Increase physical activity in their day and reduce sedentary time (increase NEAT)., and increase walking with YouTube walk at home series.    Pharmacotherapy We discussed various medication options to help Chaundra with her weight loss efforts and we both agreed to : continue with nutritional and behavioral strategies   FOR ASSOCIATED CONDITIONS ADDRESSED TODAY:  Hyperlipidemia, unspecified hyperlipidemia type Assessment & Plan: Lab Results  Component Value Date   CHOL 205 (A) 07/10/2023   HDL 66 07/10/2023   LDLCALC 115 07/10/2023   TRIG 140 07/10/2023   Pt treating condition with crestor 10 mg daily. Tolerating well with no reported side effects. On 07/10/23 lipid panel was done: Tot chol 205, trig 140, HLD 66, VLDL 24, and LDL at 115.   Reviewed ideal HLD of 60 or above. Continue exercising, try to gradually increase activity. I recommend "walk at home" exercise videos on YouTube home series. Increase her water intake and drink at least half her body weight in ounces of water.  Reviewed alternative methods to increase exercise that are convenient during cold weather and time constraints such as "walk from home" YouTube videos. Continue on current medication regimen. We will continue to monitor her condition as it relates to her weight loss.     Hypothyroidism, unspecified type Assessment & Plan: Lab Results  Component Value Date   TSH 1.267 09/16/2015   TSH obtained on was 10/18/23 0.899. Pt taking Synthroid 88 mcg once daily. Tolerating well with no adverse side effects. No current concerns today. Continue to follow up with PCP/specialists as directed. We will continue to monitor alongside PCP/specialists.   Hypertension, unspecified type Assessment & Plan: BP Readings from Last 3 Encounters:  11/06/23 136/74  10/23/23 (!) 149/75  09/26/23 (!) 142/83   BP is at goal today. Pt on amlodipine 5 mg once daily, losartan 100 mg once daily, and toprol-xl 50 mg once daily. Tolerating all antihypertensives well and reports no negative side effects. She is established with Dr. Elease Hashimoto of cardiology. Continue with current antihypertensive regimen as directed by PCP/specialists. Practice healthy habits such as mindful eating and exercise in an effort to promote weight loss. Alternative methods for exercise for cold weather was discussed with patient. Continue to follow up with PCP/specialists to manage and monitor condition. Will also monitor her condition.   Mood disorder (HCC)- emotional eating Assessment & Plan: Hunger and cravings are uncontrolled. She endorses hunger after eating dinner around 6 pm. Additionally, she endorses eating when bored. Moods are stable overall. Denies SI/HI.   I reviewed the option of seeing Dr. Dewaine Conger, our Bariatric Psychologist, for evaluation of emotional eating habits. Pt declined and would prefer to first attempt to get into the routine of following her plan. She is open to reconsidering in the future if needed. Reviewed the importance of stress management as it relates to weight loss. Reminded patient of the importance of following their prudent nutrition plan and how food can affect mood as well to support emotional wellbeing. We will continue to monitor closely alongside PCP / other  specialists.   FOLLOW UP:   Return in about 22 days (around 11/28/2023). She was informed of the importance of frequent follow up visits to maximize her success with intensive lifestyle modifications for her multiple health conditions.   Subjective:   Chief complaint: Obesity Carolyn Mcguire is here to discuss her progress with her obesity treatment plan. She is on the Category 2 Plan and states she is following her eating plan approximately 25 % of the time. She states she is walking 20 minutes 4 days per week.  Interval History:  TEKEISHA GREWAL is here today for her first follow-up office visit since starting the program with [place].  Since last office visit she is down 2 lbs. She has been trying to finish the groceries (oil, butter, and cheese) she purchased prior to her last visit. Is measuring 4-8 oz of cooked lean meats at lunch time. She recently has used her Instant Pot to cook pinto beans and plans to use it more for other meals. Is eating healthier fruits (pears). Endorses hunger after dinner at 6 pm. Stopped buying candies from [employer].   All blood work/ lab tests that were recently ordered by myself or an outside provider were reviewed with patient today per their request. Extended time was spent counseling her on all new disease processes that were discovered or preexisting ones that are affected by BMI.  she understands that many of these abnormalities will need to monitored  regularly along with the current treatment plan of prudent dietary changes, in which we are making each and every office visit, to improve these health parameters.  Pharmacotherapy for weight loss: She is not currently taking medications  for medical weight loss.  Denies side effects.    Review of Systems:  Pertinent positives were addressed with patient today.  Reviewed by clinician on day of visit: allergies, medications, problem list, medical history, surgical history, family history, social history, and previous  encounter notes.   Weight Summary and Biometrics   Weight Lost Since Last Visit: 2 lb  Weight Gained Since Last Visit: 0   Vitals Temp: 98.3 F (36.8 C) BP: 136/74 Pulse Rate: 64 SpO2: 99 %   Anthropometric Measurements Height: 5\' 4"  (1.626 m) Weight: 254 lb (115.2 kg) BMI (Calculated): 43.58 Weight at Last Visit: 256 lb Weight Lost Since Last Visit: 2 lb Weight Gained Since Last Visit: 0 Starting Weight: 256 lb Total Weight Loss (lbs): 2 lb (0.907 kg) Peak Weight: 265 lb   Body Composition  Body Fat %: 51.4 % Fat Mass (lbs): 130.8 lbs Muscle Mass (lbs): 117.6 lbs Total Body Water (lbs): 84.4 lbs Visceral Fat Rating : 18   Other Clinical Data RMR: [id number] Fasting: yes Labs: no Today's Visit #: 2 Starting Date: 10/23/23     Objective:   PHYSICAL EXAM:  Blood pressure 136/74, pulse 64, temperature 98.3 F (36.8 C), height 5\' 4"  (1.626 m), weight 254 lb (115.2 kg), SpO2 99%. Body mass index is 43.6 kg/m.  General: she is overweight, cooperative and in no acute distress.   HEENT: EOMI, sclerae are anicteric. Lungs: Normal breathing effort, no conversational dyspnea. M-Sk:  Normal gross ROM * 4 extremities  PSYCH: Has normal mood, affect and thought process. Neurologic: No gross sensory or motor deficits. Well developed, A and O * 3  DIAGNOSTIC DATA REVIEWED:  BMET    Component Value Date/Time   NA 144 10/18/2023 0000   K 4.0 10/18/2023 0000   CL 104 10/18/2023 0000   CO2 22 10/18/2023 0000   GLUCOSE 131 (H) 09/12/2022 0801   BUN 14 10/18/2023 0000   CREATININE 0.9 10/18/2023 0000   CREATININE 0.73 09/12/2022 0801   CREATININE 0.84 08/02/2016 1127   CALCIUM 9.3 10/18/2023 0000   GFRNONAA >60 09/12/2022 0801   GFRAA 76 02/18/2020 1357   No results found for: "HGBA1C" No results found for: "INSULIN" Lab Results  Component Value Date   TSH 1.267 09/16/2015   CBC    Component Value Date/Time   WBC 7.3 10/18/2023 0000   WBC 8.1 09/12/2022  0801   RBC 4.72 10/18/2023 0000   HGB 13.9 10/18/2023 0000   HCT 42 10/18/2023 0000   PLT 239 10/18/2023 0000   MCV 88.4 09/12/2022 0801   MCH 29.3 09/12/2022 0801   MCHC 33.2 09/12/2022 0801   RDW 12.4 09/12/2022 0801   Iron Studies No results found for: "IRON", "TIBC", "FERRITIN", "IRONPCTSAT" Lipid Panel     Component Value Date/Time   CHOL 205 (A) 07/10/2023 0000   TRIG 140 07/10/2023 0000   HDL 66 07/10/2023 0000   LDLCALC 115 07/10/2023 0000   Hepatic Function Panel     Component Value Date/Time   PROT 7.7 09/12/2022 0801   ALBUMIN 4.3 10/18/2023 0000   AST 20 10/18/2023 0000   ALT 15 10/18/2023 0000   ALKPHOS 116 10/18/2023 0000   BILITOT 1.3 (H) 09/12/2022 0801   BILIDIR 0.2 09/12/2022 0801   IBILI  1.1 (H) 09/12/2022 0801      Component Value Date/Time   TSH 1.267 09/16/2015 1026   Nutritional No results found for: "VD25OH"  Attestations:   Reviewed by clinician on day of visit: allergies, medications, problem list, medical history, surgical history, family history, social history, and previous encounter notes pertinent to patient's obesity diagnosis.   I have spent 47 minutes in the care of the patient today including: preparing to see patient (e.g. review and interpretation of tests, old notes ), obtaining and/or reviewing separately obtained history, performing a medically appropriate examination or evaluation, counseling and educating the patient, ordering medications, test or procedures, documenting clinical information in the electronic or other health care record, and independently interpreting results and communicating results to the patient, family, or caregiver   I, Isabelle Course, acting as a medical scribe for Thomasene Lot, DO., have compiled all relevant documentation for today's office visit on behalf of Thomasene Lot, DO, while in the presence of [employer], DO.  I have reviewed the above documentation for accuracy and completeness, and  I agree with the above. Carolyn Mcguire, D.O.  The 21st Century Cures Act was signed into law in 2016 which includes the topic of electronic health records.  This provides immediate access to information in MyChart.  This includes consultation notes, operative notes, office notes, lab results and pathology reports.  If you have any questions about what you read please let us know at your next visit so we can discuss your concerns and take corrective action if need be.  We are right here with you.

## 2023-11-19 ENCOUNTER — Other Ambulatory Visit: Payer: Self-pay | Admitting: Neurology

## 2023-11-28 ENCOUNTER — Ambulatory Visit (INDEPENDENT_AMBULATORY_CARE_PROVIDER_SITE_OTHER): Payer: PPO | Admitting: Adult Health

## 2023-11-28 ENCOUNTER — Encounter (INDEPENDENT_AMBULATORY_CARE_PROVIDER_SITE_OTHER): Payer: Self-pay | Admitting: Adult Health

## 2023-11-28 VITALS — BP 138/83 | HR 67 | Temp 98.4°F | Ht 64.0 in | Wt 253.0 lb

## 2023-11-28 DIAGNOSIS — E039 Hypothyroidism, unspecified: Secondary | ICD-10-CM

## 2023-11-28 DIAGNOSIS — E669 Obesity, unspecified: Secondary | ICD-10-CM

## 2023-11-28 DIAGNOSIS — I10 Essential (primary) hypertension: Secondary | ICD-10-CM | POA: Diagnosis not present

## 2023-11-28 DIAGNOSIS — Z6841 Body Mass Index (BMI) 40.0 and over, adult: Secondary | ICD-10-CM | POA: Diagnosis not present

## 2023-11-28 DIAGNOSIS — E785 Hyperlipidemia, unspecified: Secondary | ICD-10-CM

## 2023-11-28 NOTE — Progress Notes (Signed)
 WEIGHT SUMMARY AND BIOMETRICS  Vitals Temp: 98.4 F (36.9 C) BP: 138/83 Pulse Rate: 67 SpO2: 98 %   Anthropometric Measurements Height: 5' 4 (1.626 m) Weight: 253 lb (114.8 kg) BMI (Calculated): 43.41 Weight at Last Visit: 254 lb Weight Lost Since Last Visit: 1 lb Weight Gained Since Last Visit: 0 Starting Weight: 256 lb Total Weight Loss (lbs): 3 lb (1.361 kg) Peak Weight: 265 lb   Body Composition  Body Fat %: 52 % Fat Mass (lbs): 131.6 lbs Muscle Mass (lbs): 115.2 lbs Total Body Water (lbs): 83.8 lbs Visceral Fat Rating : 18   Other Clinical Data RMR: [id number] Fasting: yes Labs: no Today's Visit #: 3 Starting Date: 10/23/23    Chief Complaint:   OBESITY Carolyn Mcguire is here to discuss her progress with her obesity treatment plan. She is on the the Category 2 Plan and states she is following her eating plan approximately 25 % of the time. She states she is exercising Walking 15-20 minutes 4 times per week.   Interim History:  Carolyn Mcguire has been retired since 2011 She lives with her two rescue cats.  She endorses challenges following prescribed Cat 2 Meal Plan over the holidays. She estimates to be consistent on the plan around 25%. She has continued regular walking- 15-20 mins, at least 4 times per week.    Subjective:   1. Hyperlipidemia, unspecified hyperlipidemia type Lipid Panel     Component Value Date/Time   CHOL 205 (A) 07/10/2023 0000   TRIG 140 07/10/2023 0000   HDL 66 07/10/2023 0000   LDLCALC 115 07/10/2023 0000    PCP manages daily Crestor 10mg  and Co-Enzyme Q10  2. Hypothyroidism, unspecified type PCP manages  levothyroxine (SYNTHROID, LEVOTHROID) 88 MCG tablet Take 88 mcg by mouth daily before breakfast.   Unable to locate recent TSH level Last result in [facility] 09/16/2015 Component Ref Range & Units (hover) 8 yr ago  TSH 1.267  Resulting Agency [facility]   3. Hypertension, unspecified type BP stable, slightly elevated at  OV She denies CP with exertion She reports taking her antihypertensives as following: Cozaar  100mg  morning Toprol  XL 50mg  prior to lunch Norvasc  72m evening  Assessment/Plan:   1. Hyperlipidemia, unspecified hyperlipidemia type Continue daily Crestor 10mg  and Co-Enzyme Q10  2. Hypothyroidism, unspecified type (Primary) Continue daily Synthroid 88 mcg  3. Hypertension, unspecified type Continue current antihypertensive therapy Limit Na+ intake Increase plain water intake  4. BMI 40.0-44.9, adult Carolyn Mcguire), current BMI 43.41  Carolyn Mcguire is not currently in the action stage of change. As such, her goal is to get back to weightloss efforts . She has agreed to the Category 2 Plan.   Lengthy discussion on how to read food labels- reviewed 10:1 ratio  Exercise goals: Older adults should follow the adult guidelines. When older adults cannot meet the adult guidelines, they should be as physically active as their abilities and conditions will allow.  Older adults should do exercises that maintain or improve balance if they are at risk of falling.  Older adults should determine their level of effort for physical activity relative to their level of fitness.  Older adults with chronic conditions should understand whether and how their conditions affect their ability to do regular physical activity safely.  Behavioral modification strategies: increasing lean protein intake, decreasing simple carbohydrates, increasing vegetables, increasing water intake, meal planning and cooking strategies, keeping healthy foods in the home, ways to avoid boredom eating, and planning for success.  Carolyn Mcguire has  agreed to follow-up with our clinic in 2 weeks. She was informed of the importance of frequent follow-up visits to maximize her success with intensive lifestyle modifications for her multiple health conditions.   Objective:   Blood pressure 138/83, pulse 67, temperature 98.4 F (36.9 C), height 5' 4 (1.626 m), weight  253 lb (114.8 kg), SpO2 98%. Body mass index is 43.43 kg/m.  General: Cooperative, alert, well developed, in no acute distress. HEENT: Conjunctivae and lids unremarkable. Cardiovascular: Regular rhythm.  Lungs: Normal work of breathing. Neurologic: No focal deficits.   Lab Results  Component Value Date   CREATININE 0.9 10/18/2023   BUN 14 10/18/2023   NA 144 10/18/2023   K 4.0 10/18/2023   CL 104 10/18/2023   CO2 22 10/18/2023   Lab Results  Component Value Date   ALT 15 10/18/2023   AST 20 10/18/2023   ALKPHOS 116 10/18/2023   BILITOT 1.3 (H) 09/12/2022   No results found for: HGBA1C No results found for: INSULIN Lab Results  Component Value Date   TSH 1.267 09/16/2015   Lab Results  Component Value Date   CHOL 205 (A) 07/10/2023   HDL 66 07/10/2023   LDLCALC 115 07/10/2023   TRIG 140 07/10/2023   No results found for: VD25OH Lab Results  Component Value Date   WBC 7.3 10/18/2023   HGB 13.9 10/18/2023   HCT 42 10/18/2023   MCV 88.4 09/12/2022   PLT 239 10/18/2023   No results found for: IRON, TIBC, FERRITIN  Attestation Statements:   Reviewed by clinician on day of visit: allergies, medications, problem list, medical history, surgical history, family history, social history, and previous encounter notes.  Time spent on visit including pre-visit chart review and post-visit care and charting was 32 minutes.   I have reviewed the above documentation for accuracy and completeness, and I agree with the above. -  Dimitri Shakespeare d. Bobetta Korf, NP-C

## 2023-12-17 DIAGNOSIS — I10 Essential (primary) hypertension: Secondary | ICD-10-CM | POA: Diagnosis not present

## 2023-12-17 DIAGNOSIS — R7303 Prediabetes: Secondary | ICD-10-CM | POA: Diagnosis not present

## 2023-12-17 DIAGNOSIS — E7800 Pure hypercholesterolemia, unspecified: Secondary | ICD-10-CM | POA: Diagnosis not present

## 2023-12-19 ENCOUNTER — Ambulatory Visit (INDEPENDENT_AMBULATORY_CARE_PROVIDER_SITE_OTHER): Payer: PPO | Admitting: Adult Health

## 2023-12-20 ENCOUNTER — Ambulatory Visit (HOSPITAL_BASED_OUTPATIENT_CLINIC_OR_DEPARTMENT_OTHER)
Admission: RE | Admit: 2023-12-20 | Discharge: 2023-12-20 | Disposition: A | Discharge status: Home / Self Care | Payer: Self-pay | Source: Ambulatory Visit | Attending: Internal Medicine | Admitting: Internal Medicine

## 2023-12-20 DIAGNOSIS — E7849 Other hyperlipidemia: Secondary | ICD-10-CM | POA: Insufficient documentation

## 2023-12-24 DIAGNOSIS — I10 Essential (primary) hypertension: Secondary | ICD-10-CM | POA: Diagnosis not present

## 2023-12-24 DIAGNOSIS — R42 Dizziness and giddiness: Secondary | ICD-10-CM | POA: Diagnosis not present

## 2024-01-01 ENCOUNTER — Ambulatory Visit (INDEPENDENT_AMBULATORY_CARE_PROVIDER_SITE_OTHER): Payer: HMO | Admitting: Adult Health

## 2024-01-01 ENCOUNTER — Encounter (INDEPENDENT_AMBULATORY_CARE_PROVIDER_SITE_OTHER): Payer: Self-pay | Admitting: Adult Health

## 2024-01-01 VITALS — BP 135/74 | HR 67 | Temp 98.2°F | Ht 64.0 in | Wt 250.0 lb

## 2024-01-01 DIAGNOSIS — R42 Dizziness and giddiness: Secondary | ICD-10-CM

## 2024-01-01 DIAGNOSIS — E785 Hyperlipidemia, unspecified: Secondary | ICD-10-CM

## 2024-01-01 DIAGNOSIS — I10 Essential (primary) hypertension: Secondary | ICD-10-CM | POA: Diagnosis not present

## 2024-01-01 DIAGNOSIS — E669 Obesity, unspecified: Secondary | ICD-10-CM | POA: Diagnosis not present

## 2024-01-01 DIAGNOSIS — Z6841 Body Mass Index (BMI) 40.0 and over, adult: Secondary | ICD-10-CM

## 2024-01-01 NOTE — Progress Notes (Signed)
 WEIGHT SUMMARY AND BIOMETRICS  Vitals Temp: 98.2 F (36.8 C) BP: 135/74 Pulse Rate: 67 SpO2: 99 %   Anthropometric Measurements Height: 5' 4 (1.626 m) Weight: 250 lb (113.4 kg) BMI (Calculated): 42.89 Weight at Last Visit: 253lb Weight Lost Since Last Visit: 3lb Weight Gained Since Last Visit: 0 Starting Weight: 256lb Total Weight Loss (lbs): 6 lb (2.722 kg) Peak Weight: 265lb   Body Composition  Body Fat %: 51.5 % Fat Mass (lbs): 129 lbs Muscle Mass (lbs): 115.2 lbs Total Body Water (lbs): 84.4 lbs Visceral Fat Rating : 18   Other Clinical Data Fasting: no Labs: no Today's Visit #: 4 Starting Date: 10/23/23    Chief Complaint:   OBESITY Carolyn Mcguire is here to discuss her progress with her obesity treatment plan. She is on the the Category 2 Plan and states she is following her eating plan approximately 25 % of the time. She states she is exercising Walking 15 minutes 2 times per week.   Interim History:  12/15/2023- she awoke with dizziness and nausea. She was seen at her PCP 12/24/2023- Amlodipine  was decreased from 5mg  to 2.mg (1/2 tab). Continued on all other antihypertensives.  She reports resolution of daytime dizziness and nausea > 72 hours  She is still experiencing morning dizziness upon waking and with sudden movements while still in bed.  She estimates to drink 1 to 1.5 bottles of water a day (16.9 oz)  Subjective:   1. Dizziness 12/15/2023- she awoke with dizziness and nausea. She was seen at her PCP 12/24/2023- Amlodipine  was decreased from 5mg  to 2.mg (1/2 tab). Continued on all other antihypertensives.  She reports resolution of daytime dizziness and nausea > 72 hours  She is still experiencing morning dizziness upon waking and with sudden movements while still in bed.  2. Hyperlipidemia, unspecified hyperlipidemia type Lipid Panel     Component Value Date/Time   CHOL 205 (A) 07/10/2023 0000   TRIG 140 07/10/2023 0000   HDL 66  07/10/2023 0000   LDLCALC 115 07/10/2023 0000    PCP manages Crestor 10mg , per pt she is taking every other day and not daily as written in [facility]. She denies acute cardiac sx's  12/20/2023 Coronary Calcium Score   TECHNIQUE: A gated, non-contrast computed tomography scan of the heart was performed using 3mm slice thickness. Axial images were analyzed on a dedicated workstation. Calcium scoring of the coronary arteries was performed using the Agatston method.   FINDINGS: Coronary Calcium Score:   Left main: 0   Left anterior descending artery: 37.7   Left circumflex artery: 0   Right coronary artery: 0.61   Total: 38.3   Percentile: 71st   Pericardium: Normal.   Ascending Aorta: 35.4 mm at the mid ascending aorta measured in an axial plane. Aortic atherosclerosis.   IMPRESSION: 1. Coronary calcium score of 38.3. This was 71st percentile for age-, race-, and sex-matched controls.   2.  Aortic atherosclerosis.   3. Non-cardiac: See separate report from [facility].   RECOMMENDATIONS: Coronary artery calcium (CAC) score is a strong predictor of incident coronary heart disease (CHD) and provides predictive information beyond traditional risk factors. CAC scoring is reasonable to use in the decision to withhold, postpone, or initiate statin therapy in intermediate-risk or selected borderline-risk asymptomatic adults (age 83-75 years and LDL-C >=70 to <190 mg/dL) who do not have diabetes or established atherosclerotic cardiovascular disease (ASCVD).* In intermediate-risk (10-year ASCVD risk >=7.5% to <20%) adults or selected borderline-risk (10-year ASCVD  risk >=5% to <7.5%) adults in whom a CAC score is measured for the purpose of making a treatment decision the following recommendations have been made:   3. Hypertension, unspecified type BP stable at OV She reports home readings: SBP: 130s DBP: 60-70 She is currently on: rosuvastatin (CRESTOR) 10 MG  tablet  losartan  (COZAAR ) 100 MG tablet  metoprolol  succinate (TOPROL -XL) 50 MG 24 hr tablet  amLODipine  (NORVASC ) 5 MG tablet 1/2 tab nightly   Assessment/Plan:   1. Dizziness Increase water intake F/u with PCP as directed  2. Hyperlipidemia, unspecified hyperlipidemia type (Primary) F/u with PCP, re: CT Cardiac Score and statin therapy  3. Hypertension, unspecified type Continue current antihypertensive therapy Limit Na+ Increase water intake  4. Obesity, current BMI 42.89  Jamirah is currently in the action stage of change. As such, her goal is to continue with weight loss efforts. She has agreed to the Category 2 Plan.   1) Increase water intake to at least 3 bottles of water (16.9 oz)  Exercise goals: Older adults should follow the adult guidelines. When older adults cannot meet the adult guidelines, they should be as physically active as their abilities and conditions will allow.  Older adults should do exercises that maintain or improve balance if they are at risk of falling.  Older adults should determine their level of effort for physical activity relative to their level of fitness.  Older adults with chronic conditions should understand whether and how their conditions affect their ability to do regular physical activity safely.  Behavioral modification strategies: increasing lean protein intake, decreasing simple carbohydrates, increasing vegetables, increasing water intake, no skipping meals, meal planning and cooking strategies, keeping healthy foods in the home, ways to avoid boredom eating, and planning for success.  [name] has agreed to follow-up with our clinic in 4 weeks. She was informed of the importance of frequent follow-up visits to maximize her success with intensive lifestyle modifications for her multiple health conditions.   Objective:   Blood pressure 135/74, pulse 67, temperature 98.2 F (36.8 C), height 5' 4 (1.626 m), weight 250 lb (113.4 kg), SpO2  99%. Body mass index is 42.91 kg/m.  General: Cooperative, alert, well developed, in no acute distress. HEENT: Conjunctivae and lids unremarkable. Cardiovascular: Regular rhythm.  Lungs: Normal work of breathing. Neurologic: No focal deficits.   Lab Results  Component Value Date   CREATININE 0.9 10/18/2023   BUN 14 10/18/2023   NA 144 10/18/2023   K 4.0 10/18/2023   CL 104 10/18/2023   CO2 22 10/18/2023   Lab Results  Component Value Date   ALT 15 10/18/2023   AST 20 10/18/2023   ALKPHOS 116 10/18/2023   BILITOT 1.3 (H) 09/12/2022   No results found for: HGBA1C No results found for: INSULIN Lab Results  Component Value Date   TSH 1.267 09/16/2015   Lab Results  Component Value Date   CHOL 205 (A) 07/10/2023   HDL 66 07/10/2023   LDLCALC 115 07/10/2023   TRIG 140 07/10/2023   No results found for: VD25OH Lab Results  Component Value Date   WBC 7.3 10/18/2023   HGB 13.9 10/18/2023   HCT 42 10/18/2023   MCV 88.4 09/12/2022   PLT 239 10/18/2023   No results found for: IRON, TIBC, FERRITIN  Attestation Statements:   Reviewed by clinician on day of visit: allergies, medications, problem list, medical history, surgical history, family history, social history, and previous encounter notes.  Time spent on visit including pre-visit  chart review and post-visit care and charting was 26 minutes.   I have reviewed the above documentation for accuracy and completeness, and I agree with the above. -  Niko Jakel d. Evert Wenrich, NP-C

## 2024-01-07 ENCOUNTER — Encounter: Payer: Self-pay | Admitting: Cardiovascular Disease

## 2024-01-07 NOTE — Progress Notes (Unsigned)
Carolyn Mcguire Date of Birth  [birth year] [facility] [address]    [address] [place], [place]  [address] [phone]  Fax  [phone]  Problem list: 1. Left bundle branch block 2. Hypothyroidism 3. Hyperlipidemia 4. Hypertension 5. Dyspnea on exertion      68 yo female with a hx of LBBB.  Stress myoview was negative.  She's been on a diet and exercise program and has lost about 6 pounds since last year.  She is drinking more water, avoiding fast foods.  She is walking regularly.  She has been diagnosed with migraines.  She thinks muscle tension may be contributing to her headaches.  Oct. 5, 2016:  Carolyn Mcguire is seen today for follow up of her shortness of breath Has had many deaths in her family and became depressed . Was started on Welbutrin for depression .  She was seen in the ER. Was told by the pharmacist at her primary medical doctors office thought it was due to the discontinuation of her Welbutrin. She is still not feeling 100% better so she was scheduled to see me .  She is still short of breath.    DOE  With walking up from the buildings in the back of her property.  She does not getting any regular exercise. She was bitten by a fox on July 11.  Had a series of rabies shots ( the fox was rabid)  Is retired .   She has had a stressful 2 years.    Several deaths in her family . Has not had the time or inclination  Wants to start a walking program.     No CP   February 21, 2016: Doing well Has lost some weight .   Has lost 9 lbs since I last saw her  Walks regularly   Sept. 7, 2017: Doing well BP is doing well at home. Still walking some  Has been to a nutritionist.   March 2 , 2108: Carolyn Mcguire is seen today .  Follow up for hypertension and hyper-hyperlipidemia, and hypothyroidism. BP is well controlled.   February 28, 2018: Carolyn Mcguire is seen today for follow-up of her hypertension and hyperlipidemia.  She has a left bundle branch block. BP has been ok.   A bit  hight today  Wt = 250  ( up 7 lbs from last year )  Has been eating out lots  .     January 06, 2019: Carolyn Mcguire is seen today for follow-up of her hypertension and hyperlipidemia.  She has a left bundle branch block that is old.  Weight today is 246 pounds which is down 3 pounds from her last visit.  BP is well controlled Has DOE from her weight  January 20, 2020:  Carolyn Mcguire is seen back today for history of hypertension and hyperlipidemia.  She has a left bundle branch block.  Weight today is 249 pounds which is up 3 pounds of her last visit. Had irregular episode of tachycardia yesterday  Lasted several hours Had 2 separate episodes  -she took her blood pressure which was elevated.  She found that her heart rate was irregular in the 120s.  Her heart rate monitor indicated that her heart rate was irregular. Has not been watching her diet.   Still eating salty foods most meals   Feb. 28, 2022: Carolyn Mcguire is seen today for follow up of her HTN, HLD, LBBB and palpitations. She wore a 30 day monitor which did not reveal any evidence  of atrial fib and did not reveal any significant arrhythmias.  Has been busy taking care of her aunt who was in a MVA.  Has not been eating well ( eating lots of pre-prepared foods, not exercising )  She has tried HCTZ in the past but she became too volume depleted and it was stopped.   No CP , dspnea, no syncope  February 05, 2022 Carolyn Mcguire is seen for follow up of her HTN, HLD,  LBBB Some chest discomfort She has been lifting lots of heavy containers and thinks she might of strained her chest.  She also thinks it might be due to indigestion and is been taking an antacid.  She seems to be getting along better now  No additional salt .   Is walking some   Her last echocardiogram was performed in April, 2021.  It shows normal left ventricular systolic function with grade 1 diastolic dysfunction.   Feb. 28, 2024  Carolyn Mcguire is seen for follow up of her LBBB, HTN Echo Feb.  28, 2024:  LVEF is 60-65%.   Grade I DD  Normal RV  Trivial MR , trivial AI Wt is 255 lbs   Has been ok Still has occasional headaches  She would like referral to the [facility] weight loss program.   Feb. 12, 2025 Carolyn Mcguire is seen for follow up of her LBBB, HTN. Echo shows normal LV EF and grade I DD  Trivial MR, trivial AI Wt is 253 lbs   BP readings at home are normal  No CP , no dyspnea  Has been going to [employer] weight loss program  Has had some vertigo   CAC score is 38.3 (71st percentile for age / sex matched controls )  Her last LDL was 115 She was on Rosuvastatin 10 every other day She started taking it daily   She has labs scheduled at her primary medical doctor's office in the next several weeks.     Current Outpatient Medications on File Prior to Visit  Medication Sig Dispense Refill   amLODipine (NORVASC) 5 MG tablet Take 5 mg by mouth daily. Pt taking half a pill at night instead of a whole     Cholecalciferol (VITAMIN D3) 1000 units CAPS Take by mouth. One daily     Co-Enzyme Q10 100 MG CAPS Take 100 mg by mouth 2 (two) times daily.     dexamethasone 0.5 MG/5ML elixir RINSE MOUTH WITH FOR 2 MINUTES FOUR TIMES DAILY THEN EXPECTORATE     levothyroxine (SYNTHROID, LEVOTHROID) 88 MCG tablet Take 88 mcg by mouth daily before breakfast.     losartan (COZAAR) 100 MG tablet Take 100 mg by mouth daily. as directed  3   metoprolol succinate (TOPROL-XL) 50 MG 24 hr tablet Take 1 tablet (50 mg total) by mouth daily. 90 tablet 3   rizatriptan (MAXALT-MLT) 10 MG disintegrating tablet DISSOLVE 1 TABLET IN MOUTH AS NEEDED FOR MIGRAINE. MAY REPEAT IN 2 HOURS IF NEEDED 6 tablet 0   rosuvastatin (CRESTOR) 10 MG tablet Take 10 mg by mouth daily.     No current facility-administered medications on file prior to visit.    Allergies  Allergen Reactions   Elemental Sulfur Rash    MAKES PT RED    Past Medical History:  Diagnosis Date   Adenomatous colon polyp     Allergic rhinitis    Animal bite 05/2015   fox    Anxiety    Arthritis    shoulder  Carpal tunnel syndrome    RIGHT HAND   Depression    Diastolic dysfunction    a. 2D echo 09/08/15: EF 55-60%, grade 1 DD, elevated LVEDP, mild MR, mild TR. - normal BNP 08/2015.   Essential hypertension    GERD (gastroesophageal reflux disease)    Headache    Heart murmur    Hepatic steatosis    Hyperlipidemia    Hypothyroidism    LBBB (left bundle branch block)    a. negative nuclear stress test 06/2011.   Migraines    Obesity    OSA (obstructive sleep apnea)    mild   Prediabetes    Tinnitus    Varicose vein of leg    Vitamin D deficiency     Past Surgical History:  Procedure Laterality Date   DERMOID CYST  EXCISION     OF HER RIGHT OVARY. SIZE OF A GRAPEFRUIT. OVARY IS STILL THERE.   HEMITHYROIDECTOMY     RUPTURE BELLY BUTTON     WHEN SHE WAS AN INFANT   TONSILECTOMY, ADENOIDECTOMY, BILATERAL MYRINGOTOMY AND TUBES     AGE 74, pt states she didn't have tubes put into her ears    Social History   Tobacco Use  Smoking Status Never  Smokeless Tobacco Never    Social History   Substance and Sexual Activity  Alcohol Use No    Family History  Problem Relation Age of Onset   Neuropathy Mother    Parkinsonism Mother    Ovarian cancer Mother        dx. 58-59; TAH-BSO at 62   Diabetes Mother    Pancreatic cancer Mother 90       d. 31   Skin cancer Mother        maybe basal cell carcinoma   Obesity Mother    Heart disease Father    Bladder Cancer Father        dx 25-81   Skin cancer Father        possibly melanoma; +sun exposure   Cancer Brother 76       salivary gland cancer; d. 53; no tobacco use   Breast cancer Maternal Aunt        dx unspecified age; s/p mastectomy   Other Maternal Aunt        intellectual disabilities   Heart failure Maternal Aunt 86       acute diastolic heart failure   Colon cancer Maternal Uncle        dx late 67s; d. early 70s   Heart  attack Maternal Uncle 16   Cancer Paternal Uncle        CLL, dx. 92   Ovarian cancer Maternal Grandmother        d. 24; w/ mets   Heart attack Maternal Grandfather        d. 72   Esophageal cancer Neg Hx    Rectal cancer Neg Hx    Stomach cancer Neg Hx     Reviw of Systems:  Noted in current history, all other systems are negative.Marland Kitchen     Physical Exam: Blood pressure 138/85, pulse 79, height 5\' 4"  (1.626 m), weight 253 lb 9.6 oz (115 kg), SpO2 98%.       GEN:  Well nourished, well developed in no acute distress HEENT: Normal NECK: No JVD; No carotid bruits LYMPHATICS: No lymphadenopathy CARDIAC: RRR , no murmurs, rubs, gallops RESPIRATORY:  Clear to auscultation without rales, wheezing or rhonchi  ABDOMEN: Soft, non-tender,  non-distended MUSCULOSKELETAL:  No edema; No deformity  SKIN: Warm and dry NEUROLOGIC:  Alert and oriented x 3    ECG:          .  Assessment / Plan:   1. Left bundle branch block-       stable.  Her LVEF remains normal   2. Hypothyroidism-        3. Hyperlipidemia -last LDL is 115.  Her coronary calcium score was 38.3.  Her goal LDL is less than 70.  She has been taking her rosuvastatin on a daily basis.  She will have a labs with her primary medical doctor.  I would have a low threshold to add Zetia 10 mg a day if her LDL is not at goal.  We also could consider referring her to the lipid clinic for consideration of a PCSK9 inhibitor.  I have strongly encouraged her to continue working on weight loss and regular exercise.   4. Hypertension -blood pressure readings at home are normal.  Continue weight loss efforts.     5.  Palpitations:      5.  Chronic diastolic congestive heart failure: Echo remains unchanged.  She has grade 1 diastolic dysfunction.  I have advised her to continue with blood pressure control and weight loss efforts.  7.  Morbid obesity:   We will refer her to the [facility] weight loss program. Follow up in a  year     Kristeen Miss, MD  01/08/2024 12:18 PM    [facility] [address] [place],  [address] [place], [place]  [address] Pager [phone] Phone: [phone]; Fax: [phone]

## 2024-01-08 ENCOUNTER — Ambulatory Visit: Payer: HMO | Attending: Cardiovascular Disease | Admitting: Cardiovascular Disease

## 2024-01-08 ENCOUNTER — Encounter: Payer: Self-pay | Admitting: Cardiovascular Disease

## 2024-01-08 VITALS — BP 138/85 | HR 79 | Ht 64.0 in | Wt 253.6 lb

## 2024-01-08 DIAGNOSIS — I2510 Atherosclerotic heart disease of native coronary artery without angina pectoris: Secondary | ICD-10-CM | POA: Diagnosis not present

## 2024-01-08 DIAGNOSIS — E782 Mixed hyperlipidemia: Secondary | ICD-10-CM | POA: Diagnosis not present

## 2024-01-08 DIAGNOSIS — I10 Essential (primary) hypertension: Secondary | ICD-10-CM | POA: Diagnosis not present

## 2024-01-08 NOTE — Patient Instructions (Signed)

## 2024-01-09 ENCOUNTER — Telehealth: Payer: Self-pay | Admitting: Cardiovascular Disease

## 2024-01-09 NOTE — Telephone Encounter (Signed)
Patient stated she did not have an EKG test yesterday and wants to know if she should have had this test done.

## 2024-01-09 NOTE — Telephone Encounter (Signed)
Advised pt that EKG for Dr. Elease Hashimoto does EKGs once a year, unless having acute issue. Advised pt that there is a EKG in the chart from November 2024. Pt agreed and no further questions.

## 2024-01-13 NOTE — Progress Notes (Deleted)
 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 Headache diary Follow up 4-5 months.       Subjective:  Carolyn Mcguire is a 68 year old female with HTN, diastolic dysfunction, HLD, hypothyroidism who follows up for migraines.  UPDATE: She had been on gabapentin for a little over a year.  ***.  However, she just discontinued it due to causing leg swelling.  Insurance denied rizatriptan.  ***   Current NSAIDS/analgesics:  Tylenol Arthritis (once every 2 weeks) Current triptans:  none Current ergotamine:  none Current anti-emetic:  Zofran ODT 4mg  Current muscle relaxants:  none Current Antihypertensive medications:  metoprolol succinate, amlodipine,losartan Current Antidepressant medications:  none Current Anticonvulsant medications:  none Current anti-CGRP:  none Current Vitamins/Herbal/Supplements:  CoQ10, KCl Current Antihistamines/Decongestants:  none Other therapy:  cold packs, hot shower Hormone/birth control:  none Has seen ENT for stapedius myoclonus.  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  Caffeine:  No coffee.  Drinks tea 1 to 2 times a week Diet:  Tries to increase water intake.  No soda.  She says she needs to lose weight.   Exercise: trying to walk more.   Depression:  some; Anxiety:  some Other pain:  generalized aches, arthritis Sleep hygiene:  Has mild to moderate OSA.  Does not use CPAP.  Sleep is good. 4-6 hours.  Feels tired in the day.  HISTORY: Onset:  Since around 68 years old Location:  diffuse, occasionally just temples or back of head.  Some neck pain (has arthritis in neck) Quality:  pounding Intensity:  8/10.   Aura:  absent Prodrome:  absent Associated symptoms:  Nausea, photophobia, phonophobia.  She denies associated visual disturbance, unilateral numbness or weakness. Duration:  a couple of hours to all day Frequency:  2 days a week Frequency of abortive medication:  Tylenol Arthritis once a week.  Rarely rizatriptan Triggers:  maybe change in weather, stress Relieving factors:  rest in dark room, hot shower, cool pack to head.   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      Past NSAIDS/analgesics:  naproxen, meloxicam Past abortive triptans:  sumatriptan 50mg , rizatriptan 10mg  Past abortive ergotamine:  none Past muscle relaxants:  baclofen, Robaxin Past anti-emetic:  Zofran 8mg  Past antihypertensive medications:  furosemide, valsartan, HCTZ Past antidepressant medications:  venlafaxine, sertraline, bupropion Past anticonvulsant medications:  topiramate, gabapentin (leg swelling) Past anti-CGRP:  none Past vitamins/Herbal/Supplements:  none Past antihistamines/decongestants:  none Other past therapies:  trigger point injections in back of head/neck    Family history of headache:  No   PAST MEDICAL HISTORY: Past Medical History:  Diagnosis Date   Adenomatous colon polyp    Allergic rhinitis    Animal bite 05/2015   fox    Anxiety    Arthritis    shoulder   Carpal tunnel syndrome    RIGHT HAND   Depression    Diastolic dysfunction    a. 2D echo 09/08/15: EF 55-60%, grade 1 DD, elevated LVEDP, mild  MR, mild TR. - normal BNP 08/2015.   Essential hypertension    GERD (gastroesophageal reflux disease)    Headache    Heart murmur    Hepatic steatosis    Hyperlipidemia    Hypothyroidism    LBBB (left bundle branch block)    a. negative nuclear stress test 06/2011.   Migraines    Obesity    OSA  (obstructive sleep apnea)    mild   Prediabetes    Tinnitus    Varicose vein of leg    Vitamin D deficiency     MEDICATIONS: Current Outpatient Medications on File Prior to Visit  Medication Sig Dispense Refill   amLODipine (NORVASC) 5 MG tablet Take 5 mg by mouth daily. Pt taking half a pill at night instead of a whole     Cholecalciferol (VITAMIN D3) 1000 units CAPS Take by mouth. One daily     Co-Enzyme Q10 100 MG CAPS Take 100 mg by mouth 2 (two) times daily.     dexamethasone 0.5 MG/5ML elixir RINSE MOUTH WITH FOR 2 MINUTES FOUR TIMES DAILY THEN EXPECTORATE     levothyroxine (SYNTHROID, LEVOTHROID) 88 MCG tablet Take 88 mcg by mouth daily before breakfast.     losartan (COZAAR) 100 MG tablet Take 100 mg by mouth daily. as directed  3   metoprolol succinate (TOPROL-XL) 50 MG 24 hr tablet Take 1 tablet (50 mg total) by mouth daily. 90 tablet 3   rizatriptan (MAXALT-MLT) 10 MG disintegrating tablet DISSOLVE 1 TABLET IN MOUTH AS NEEDED FOR MIGRAINE. MAY REPEAT IN 2 HOURS IF NEEDED 6 tablet 0   rosuvastatin (CRESTOR) 10 MG tablet Take 10 mg by mouth daily.     No current facility-administered medications on file prior to visit.    ALLERGIES: Allergies  Allergen Reactions   Elemental Sulfur Rash    MAKES PT RED    FAMILY HISTORY: Family History  Problem Relation Age of Onset   Neuropathy Mother    Parkinsonism Mother    Ovarian cancer Mother        dx. 15-59; TAH-BSO at 70   Diabetes Mother    Pancreatic cancer Mother 75       d. 71   Skin cancer Mother        maybe basal cell carcinoma   Obesity Mother    Heart disease Father    Bladder Cancer Father        dx 60-81   Skin cancer Father        possibly melanoma; +sun exposure   Cancer Brother 34       salivary gland cancer; d. 10; no tobacco use   Breast cancer Maternal Aunt        dx unspecified age; s/p mastectomy   Other Maternal Aunt        intellectual disabilities   Heart failure Maternal Aunt 86        acute diastolic heart failure   Colon cancer Maternal Uncle        dx late 69s; d. early 70s   Heart attack Maternal Uncle 68   Cancer Paternal Uncle        CLL, dx. 76   Ovarian cancer Maternal Grandmother        d. 55; w/ mets   Heart attack Maternal Grandfather        d. 72   Esophageal cancer Neg Hx    Rectal cancer Neg Hx    Stomach cancer Neg Hx  Objective:  *** General: No acute distress.  Patient appears well-groomed.   Head:  Normocephalic/atraumatic Neck:  Supple.  No paraspinal tenderness.  Full range of motion. Heart:  Regular rate and rhythm. Neuro:  Alert and oriented.  Speech fluent and not dysarthric.  Language intact.  CN II-XII intact.  Bulk and tone normal.  Muscle strength 5/5 throughout.  Deep tendon reflexes 2+ throughout.  Gait normal.  Romberg negative.    Shon Millet, DO  CC: Merri Brunette, MD

## 2024-01-14 ENCOUNTER — Ambulatory Visit: Payer: BC Managed Care – PPO | Admitting: Neurology

## 2024-01-17 ENCOUNTER — Ambulatory Visit: Payer: BC Managed Care – PPO | Admitting: Neurology

## 2024-01-17 DIAGNOSIS — H8110 Benign paroxysmal vertigo, unspecified ear: Secondary | ICD-10-CM | POA: Diagnosis not present

## 2024-01-17 DIAGNOSIS — I2510 Atherosclerotic heart disease of native coronary artery without angina pectoris: Secondary | ICD-10-CM | POA: Diagnosis not present

## 2024-01-17 DIAGNOSIS — R7303 Prediabetes: Secondary | ICD-10-CM | POA: Diagnosis not present

## 2024-01-27 DIAGNOSIS — I2510 Atherosclerotic heart disease of native coronary artery without angina pectoris: Secondary | ICD-10-CM | POA: Diagnosis not present

## 2024-01-27 DIAGNOSIS — R7303 Prediabetes: Secondary | ICD-10-CM | POA: Diagnosis not present

## 2024-01-27 DIAGNOSIS — I10 Essential (primary) hypertension: Secondary | ICD-10-CM | POA: Diagnosis not present

## 2024-01-28 DIAGNOSIS — H8110 Benign paroxysmal vertigo, unspecified ear: Secondary | ICD-10-CM | POA: Diagnosis not present

## 2024-01-30 ENCOUNTER — Encounter (INDEPENDENT_AMBULATORY_CARE_PROVIDER_SITE_OTHER): Payer: Self-pay | Admitting: Adult Health

## 2024-01-30 ENCOUNTER — Ambulatory Visit (INDEPENDENT_AMBULATORY_CARE_PROVIDER_SITE_OTHER): Payer: HMO | Admitting: Adult Health

## 2024-01-30 VITALS — BP 147/87 | HR 70 | Temp 98.8°F | Ht 64.0 in | Wt 254.0 lb

## 2024-01-30 DIAGNOSIS — E669 Obesity, unspecified: Secondary | ICD-10-CM | POA: Diagnosis not present

## 2024-01-30 DIAGNOSIS — F5089 Other specified eating disorder: Secondary | ICD-10-CM

## 2024-01-30 DIAGNOSIS — I10 Essential (primary) hypertension: Secondary | ICD-10-CM | POA: Diagnosis not present

## 2024-01-30 DIAGNOSIS — F39 Unspecified mood [affective] disorder: Secondary | ICD-10-CM

## 2024-01-30 DIAGNOSIS — Z6841 Body Mass Index (BMI) 40.0 and over, adult: Secondary | ICD-10-CM | POA: Diagnosis not present

## 2024-01-30 DIAGNOSIS — E785 Hyperlipidemia, unspecified: Secondary | ICD-10-CM | POA: Diagnosis not present

## 2024-01-30 NOTE — Progress Notes (Signed)
 WEIGHT SUMMARY AND BIOMETRICS  Vitals Temp: 98.8 F (37.1 C) BP: (!) 147/87 Pulse Rate: 70 SpO2: 98 %   Anthropometric Measurements Height: 5\' 4"  (1.626 m) Weight: 254 lb (115.2 kg) BMI (Calculated): 43.58 Weight at Last Visit: 250 lb Weight Lost Since Last Visit: 0 Weight Gained Since Last Visit: 4 lb Starting Weight: 256 lb Total Weight Loss (lbs): 2 lb (0.907 kg) Peak Weight: 265 lb   Body Composition  Body Fat %: 52.1 % Fat Mass (lbs): 132.4 lbs Muscle Mass (lbs): 115.4 lbs Total Body Water (lbs): 87 lbs Visceral Fat Rating : 18   Other Clinical Data Fasting: yes Labs: no Today's Visit #: 5 Starting Date: 10/23/23    Chief Complaint:   OBESITY Carolyn Mcguire is here to discuss her progress with her obesity treatment plan.  She is on the the Category 2 Plan and states she is following her eating plan approximately 0 % of the time.  She states she is exercising Walking 15-20 minutes 3 times per week.   Interim History:  She reports decreased motivation to eat on plan or exercise at high intensity  She maintained her muscle mass and increased her adipose mass since last OV at [facility]  She was seen by Cards/Dr. Melburn Popper on 01/08/2024 She was seen by PCP/Dr. Renne Crigler- labs completed- unable to find results in Care Everywhere  Stress- life is stable  Subjective:   1. Hypertension, unspecified type BP  above goal at OV She reports taking antihypertensive therapy only 10 mins prior to appt today She denies acute cardiac sx's She is currently on losartan (COZAAR) 100 MG tablet  metoprolol succinate (TOPROL-XL) 50 MG 24 hr tablet  amLODipine (NORVASC) 5 MG tablet- 1/2 tab at night   Home readings: SBP: 120-130 DBP: 70-80s  2. Hyperlipidemia, unspecified hyperlipidemia type Lipid Panel     Component Value Date/Time   CHOL 205 (A) 07/10/2023 0000   TRIG 140 07/10/2023 0000   HDL 66 07/10/2023 0000   LDLCALC 115 07/10/2023 0000    She is on daily Crestor  10mg - denies myalgias  3. Mood disorder (HCC)- emotional eating She reports stable mood, denies SI/HI She was briefly on Wellbutrin XL 150- last used in 2016 She is happily retired and has pets at home  Assessment/Plan:   1. Hypertension, unspecified type (Primary) Remain well hydrated with water Limit Na+ intake Continue to monitor BP at home  2. Hyperlipidemia, unspecified hyperlipidemia type Limit sat fat Increase regular walking   3. Mood disorder (HCC)- emotional eating Increase regular walking  4. Obesity, CURRENT BMI 43.6  Carolyn Mcguire is currently in the action stage of change. As such, her goal is to continue with weight loss efforts. She has agreed to the Category 2 Plan.   1) Walk 3-4 times per week, at least 20 mins- take a pet with you! 2) Increase compliance to Cat 2 Meal Plan at least 50%, up from 0% 3) Increase water intake to at least 32 oz/day  Exercise goals: Older adults should follow the adult guidelines. When older adults cannot meet the adult guidelines, they should be as physically active as their abilities and conditions will allow.  Older adults should do exercises that maintain or improve balance if they are at risk of falling.  Older adults should determine their level of effort for physical activity relative to their level of fitness.  Older adults with chronic conditions should understand whether and how their conditions affect their ability to do regular  physical activity safely.  Behavioral modification strategies: increasing lean protein intake, decreasing simple carbohydrates, increasing vegetables, increasing water intake, no skipping meals, meal planning and cooking strategies, keeping healthy foods in the home, ways to avoid boredom eating, and planning for success.  Lidya has agreed to follow-up with our clinic in 4 weeks. She was informed of the importance of frequent follow-up visits to maximize her success with intensive lifestyle modifications for  her multiple health conditions.   Check Fasting Labs Spring 2025  Objective:   Blood pressure (!) 147/87, pulse 70, temperature 98.8 F (37.1 C), height 5\' 4"  (1.626 m), weight 254 lb (115.2 kg), SpO2 98%. Body mass index is 43.6 kg/m.  General: Cooperative, alert, well developed, in no acute distress. HEENT: Conjunctivae and lids unremarkable. Cardiovascular: Regular rhythm.  Lungs: Normal work of breathing. Neurologic: No focal deficits.   Lab Results  Component Value Date   CREATININE 0.9 10/18/2023   BUN 14 10/18/2023   NA 144 10/18/2023   K 4.0 10/18/2023   CL 104 10/18/2023   CO2 22 10/18/2023   Lab Results  Component Value Date   ALT 15 10/18/2023   AST 20 10/18/2023   ALKPHOS 116 10/18/2023   BILITOT 1.3 (H) 09/12/2022   No results found for: "HGBA1C" No results found for: "INSULIN" Lab Results  Component Value Date   TSH 1.267 09/16/2015   Lab Results  Component Value Date   CHOL 205 (A) 07/10/2023   HDL 66 07/10/2023   LDLCALC 115 07/10/2023   TRIG 140 07/10/2023   No results found for: "VD25OH" Lab Results  Component Value Date   WBC 7.3 10/18/2023   HGB 13.9 10/18/2023   HCT 42 10/18/2023   MCV 88.4 09/12/2022   PLT 239 10/18/2023   No results found for: "IRON", "TIBC", "FERRITIN"  Attestation Statements:   Reviewed by clinician on day of visit: allergies, medications, problem list, medical history, surgical history, family history, social history, and previous encounter notes.  I have reviewed the above documentation for accuracy and completeness, and I agree with the above. -  Ellenor Wisniewski d. Keiron Iodice, NP-C

## 2024-02-05 DIAGNOSIS — H8110 Benign paroxysmal vertigo, unspecified ear: Secondary | ICD-10-CM | POA: Diagnosis not present

## 2024-02-11 DIAGNOSIS — E7800 Pure hypercholesterolemia, unspecified: Secondary | ICD-10-CM | POA: Diagnosis not present

## 2024-02-11 DIAGNOSIS — R7303 Prediabetes: Secondary | ICD-10-CM | POA: Diagnosis not present

## 2024-02-11 DIAGNOSIS — I10 Essential (primary) hypertension: Secondary | ICD-10-CM | POA: Diagnosis not present

## 2024-02-12 DIAGNOSIS — H8110 Benign paroxysmal vertigo, unspecified ear: Secondary | ICD-10-CM | POA: Diagnosis not present

## 2024-02-12 NOTE — Progress Notes (Unsigned)
 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 Headache diary Follow up 4-5 months.       Subjective:  Carolyn Mcguire is a 68 year old female with HTN, diastolic dysfunction, HLD, hypothyroidism who follows up for migraines.  UPDATE: She had been on gabapentin for a little over a year.  ***.  However, she just discontinued it due to causing leg swelling.  Insurance denied rizatriptan.  ***   Current NSAIDS/analgesics:  Tylenol Arthritis (once every 2 weeks) Current triptans:  none Current ergotamine:  none Current anti-emetic:  Zofran ODT 4mg  Current muscle relaxants:  none Current Antihypertensive medications:  metoprolol succinate, amlodipine,losartan Current Antidepressant medications:  none Current Anticonvulsant medications:  none Current anti-CGRP:  none Current Vitamins/Herbal/Supplements:  CoQ10, KCl Current Antihistamines/Decongestants:  none Other therapy:  cold packs, hot shower Hormone/birth control:  none Has seen ENT for stapedius myoclonus.  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  Caffeine:  No coffee.  Drinks tea 1 to 2 times a week Diet:  Tries to increase water intake.  No soda.  She says she needs to lose weight.   Exercise: trying to walk more.   Depression:  some; Anxiety:  some Other pain:  generalized aches, arthritis Sleep hygiene:  Has mild to moderate OSA.  Does not use CPAP.  Sleep is good. 4-6 hours.  Feels tired in the day.  HISTORY: Onset:  Since around 68 years old Location:  diffuse, occasionally just temples or back of head.  Some neck pain (has arthritis in neck) Quality:  pounding Intensity:  8/10.   Aura:  absent Prodrome:  absent Associated symptoms:  Nausea, photophobia, phonophobia.  She denies associated visual disturbance, unilateral numbness or weakness. Duration:  a couple of hours to all day Frequency:  2 days a week Frequency of abortive medication:  Tylenol Arthritis once a week.  Rarely rizatriptan Triggers:  maybe change in weather, stress Relieving factors:  rest in dark room, hot shower, cool pack to head.   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      Past NSAIDS/analgesics:  naproxen, meloxicam Past abortive triptans:  sumatriptan 50mg , rizatriptan 10mg  Past abortive ergotamine:  none Past muscle relaxants:  baclofen, Robaxin Past anti-emetic:  Zofran 8mg  Past antihypertensive medications:  furosemide, valsartan, HCTZ Past antidepressant medications:  venlafaxine, sertraline, bupropion Past anticonvulsant medications:  topiramate, gabapentin (leg swelling) Past anti-CGRP:  none Past vitamins/Herbal/Supplements:  none Past antihistamines/decongestants:  none Other past therapies:  trigger point injections in back of head/neck    Family history of headache:  No   PAST MEDICAL HISTORY: Past Medical History:  Diagnosis Date   Adenomatous colon polyp    Allergic rhinitis    Animal bite 05/2015   fox    Anxiety    Arthritis    shoulder   Carpal tunnel syndrome    RIGHT HAND   Depression    Diastolic dysfunction    a. 2D echo 09/08/15: EF 55-60%, grade 1 DD, elevated LVEDP, mild  MR, mild TR. - normal BNP 08/2015.   Essential hypertension    GERD (gastroesophageal reflux disease)    Headache    Heart murmur    Hepatic steatosis    Hyperlipidemia    Hypothyroidism    LBBB (left bundle branch block)    a. negative nuclear stress test 06/2011.   Migraines    Obesity    OSA  (obstructive sleep apnea)    mild   Prediabetes    Tinnitus    Varicose vein of leg    Vitamin D deficiency     MEDICATIONS: Current Outpatient Medications on File Prior to Visit  Medication Sig Dispense Refill   amLODipine (NORVASC) 5 MG tablet Take 5 mg by mouth daily. Pt taking half a pill at night instead of a whole     Cholecalciferol (VITAMIN D3) 1000 units CAPS Take by mouth. One daily     Co-Enzyme Q10 100 MG CAPS Take 100 mg by mouth 2 (two) times daily.     dexamethasone 0.5 MG/5ML elixir RINSE MOUTH WITH FOR 2 MINUTES FOUR TIMES DAILY THEN EXPECTORATE     levothyroxine (SYNTHROID, LEVOTHROID) 88 MCG tablet Take 88 mcg by mouth daily before breakfast.     losartan (COZAAR) 100 MG tablet Take 100 mg by mouth daily. as directed  3   metoprolol succinate (TOPROL-XL) 50 MG 24 hr tablet Take 1 tablet (50 mg total) by mouth daily. 90 tablet 3   rizatriptan (MAXALT-MLT) 10 MG disintegrating tablet DISSOLVE 1 TABLET IN MOUTH AS NEEDED FOR MIGRAINE. MAY REPEAT IN 2 HOURS IF NEEDED 6 tablet 0   rosuvastatin (CRESTOR) 10 MG tablet Take 10 mg by mouth daily.     No current facility-administered medications on file prior to visit.    ALLERGIES: Allergies  Allergen Reactions   Elemental Sulfur Rash    MAKES PT RED    FAMILY HISTORY: Family History  Problem Relation Age of Onset   Neuropathy Mother    Parkinsonism Mother    Ovarian cancer Mother        dx. 35-59; TAH-BSO at 59   Diabetes Mother    Pancreatic cancer Mother 61       d. 57   Skin cancer Mother        maybe basal cell carcinoma   Obesity Mother    Heart disease Father    Bladder Cancer Father        dx 22-81   Skin cancer Father        possibly melanoma; +sun exposure   Cancer Brother 50       salivary gland cancer; d. 42; no tobacco use   Breast cancer Maternal Aunt        dx unspecified age; s/p mastectomy   Other Maternal Aunt        intellectual disabilities   Heart failure Maternal Aunt 86        acute diastolic heart failure   Colon cancer Maternal Uncle        dx late 23s; d. early 70s   Heart attack Maternal Uncle 38   Cancer Paternal Uncle        CLL, dx. 73   Ovarian cancer Maternal Grandmother        d. 21; w/ mets   Heart attack Maternal Grandfather        d. 72   Esophageal cancer Neg Hx    Rectal cancer Neg Hx    Stomach cancer Neg Hx  Objective:  *** General: No acute distress.  Patient appears well-groomed.   Head:  Normocephalic/atraumatic Neck:  Supple.  No paraspinal tenderness.  Full range of motion. Heart:  Regular rate and rhythm. Neuro:  Alert and oriented.  Speech fluent and not dysarthric.  Language intact.  CN II-XII intact.  Bulk and tone normal.  Muscle strength 5/5 throughout.  Deep tendon reflexes 2+ throughout.  Gait normal.  Romberg negative.    Shon Millet, DO  CC: Merri Brunette, MD

## 2024-02-13 DIAGNOSIS — L720 Epidermal cyst: Secondary | ICD-10-CM | POA: Diagnosis not present

## 2024-02-13 DIAGNOSIS — L570 Actinic keratosis: Secondary | ICD-10-CM | POA: Diagnosis not present

## 2024-02-13 DIAGNOSIS — L718 Other rosacea: Secondary | ICD-10-CM | POA: Diagnosis not present

## 2024-02-14 ENCOUNTER — Ambulatory Visit: Payer: BC Managed Care – PPO | Admitting: Neurology

## 2024-02-14 ENCOUNTER — Encounter: Payer: Self-pay | Admitting: Neurology

## 2024-02-14 VITALS — BP 130/70 | HR 76 | Resp 98 | Ht 64.0 in | Wt 254.0 lb

## 2024-02-14 DIAGNOSIS — G43709 Chronic migraine without aura, not intractable, without status migrainosus: Secondary | ICD-10-CM

## 2024-02-14 DIAGNOSIS — M47812 Spondylosis without myelopathy or radiculopathy, cervical region: Secondary | ICD-10-CM

## 2024-02-14 NOTE — Patient Instructions (Signed)
 Refer to physical therapy for neck pain Rizatriptan for acute headache attacks Contact your insurance to find out if any of the below options are reasonable: Nurtec - a dissolvable tablet once every other day Vyepti - an IV infusion every 3 months Aimovig - an injection every 4 weeks Ajovy - an injection every 4 weeks Emgality - an injection every 4 weeks. Qulipta - a daily pill (not dissolvable) Limit use of pain relievers to no more than 2 days out of week to prevent risk of rebound or medication-overuse headache. Keep headache diary Follow up 6 months.

## 2024-02-19 DIAGNOSIS — H8110 Benign paroxysmal vertigo, unspecified ear: Secondary | ICD-10-CM | POA: Diagnosis not present

## 2024-02-26 DIAGNOSIS — H8110 Benign paroxysmal vertigo, unspecified ear: Secondary | ICD-10-CM | POA: Diagnosis not present

## 2024-03-10 ENCOUNTER — Ambulatory Visit (INDEPENDENT_AMBULATORY_CARE_PROVIDER_SITE_OTHER): Admitting: Adult Health

## 2024-03-11 DIAGNOSIS — Z01411 Encounter for gynecological examination (general) (routine) with abnormal findings: Secondary | ICD-10-CM | POA: Diagnosis not present

## 2024-03-11 DIAGNOSIS — Z1331 Encounter for screening for depression: Secondary | ICD-10-CM | POA: Diagnosis not present

## 2024-03-11 DIAGNOSIS — R87610 Atypical squamous cells of undetermined significance on cytologic smear of cervix (ASC-US): Secondary | ICD-10-CM | POA: Diagnosis not present

## 2024-03-11 DIAGNOSIS — Z124 Encounter for screening for malignant neoplasm of cervix: Secondary | ICD-10-CM | POA: Diagnosis not present

## 2024-03-11 DIAGNOSIS — Z01419 Encounter for gynecological examination (general) (routine) without abnormal findings: Secondary | ICD-10-CM | POA: Diagnosis not present

## 2024-03-19 DIAGNOSIS — L720 Epidermal cyst: Secondary | ICD-10-CM | POA: Diagnosis not present

## 2024-03-19 DIAGNOSIS — L814 Other melanin hyperpigmentation: Secondary | ICD-10-CM | POA: Diagnosis not present

## 2024-03-19 DIAGNOSIS — L718 Other rosacea: Secondary | ICD-10-CM | POA: Diagnosis not present

## 2024-03-19 DIAGNOSIS — L821 Other seborrheic keratosis: Secondary | ICD-10-CM | POA: Diagnosis not present

## 2024-03-24 DIAGNOSIS — E7800 Pure hypercholesterolemia, unspecified: Secondary | ICD-10-CM | POA: Diagnosis not present

## 2024-03-24 DIAGNOSIS — R7303 Prediabetes: Secondary | ICD-10-CM | POA: Diagnosis not present

## 2024-03-24 DIAGNOSIS — I10 Essential (primary) hypertension: Secondary | ICD-10-CM | POA: Diagnosis not present

## 2024-04-09 ENCOUNTER — Ambulatory Visit (INDEPENDENT_AMBULATORY_CARE_PROVIDER_SITE_OTHER): Admitting: Adult Health

## 2024-04-09 ENCOUNTER — Encounter (INDEPENDENT_AMBULATORY_CARE_PROVIDER_SITE_OTHER): Payer: Self-pay | Admitting: Adult Health

## 2024-04-09 VITALS — BP 122/71 | HR 64 | Temp 98.5°F | Ht 64.0 in | Wt 249.0 lb

## 2024-04-09 DIAGNOSIS — Z6841 Body Mass Index (BMI) 40.0 and over, adult: Secondary | ICD-10-CM

## 2024-04-09 DIAGNOSIS — E669 Obesity, unspecified: Secondary | ICD-10-CM

## 2024-04-09 DIAGNOSIS — I10 Essential (primary) hypertension: Secondary | ICD-10-CM

## 2024-04-09 DIAGNOSIS — E559 Vitamin D deficiency, unspecified: Secondary | ICD-10-CM

## 2024-04-09 DIAGNOSIS — E785 Hyperlipidemia, unspecified: Secondary | ICD-10-CM

## 2024-04-09 DIAGNOSIS — F39 Unspecified mood [affective] disorder: Secondary | ICD-10-CM | POA: Diagnosis not present

## 2024-04-09 NOTE — Progress Notes (Signed)
 WEIGHT SUMMARY AND BIOMETRICS  Vitals Temp: 98.5 F (36.9 C) BP: 122/71 Pulse Rate: 64 SpO2: 97 %   Anthropometric Measurements Height: 5\' 4"  (1.626 m) Weight: 249 lb (112.9 kg) BMI (Calculated): 42.72 Weight at Last Visit: 254 lb Weight Lost Since Last Visit: 5 lb Starting Weight: 256 lb Total Weight Loss (lbs): 7 lb (3.175 kg) Peak Weight: 265lb   Body Composition  Body Fat %: 50.9 % Fat Mass (lbs): 126.8 lbs Muscle Mass (lbs): 116 lbs Total Body Water (lbs): 85.4 lbs Visceral Fat Rating : 18   Other Clinical Data Fasting: no Labs: no Today's Visit #: 6 Starting Date: 10/23/23    Chief Complaint:   OBESITY Carolyn Mcguire is here to discuss her progress with her obesity treatment plan.  She is on the the Category 2 Plan and states she is following her eating plan approximately 25 % of the time.  She states she is exercising Walking / Yard and [employer] 15-20 minutes 3/ depends times per week.   Interim History:  Reviewed Biompedence results with pt: Muscle Mass: +0.6 lb Adipose Mass: -5.6 lbs  She is trying to average at least 5K steps/day It is easier to achieve that goal when at [employer] or when she is shopping.  Carolyn Mcguire reports frequent eating out at lunch and dinner. Calorie range and protein goals provided for L/D and the new Eating Out Guide  Recent PCP visit on 03/24/2024 Dr. Schuyler Mcguire  Subjective:   1. Vitamin D deficiency She endorses stable energy levels  2. Hyperlipidemia, unspecified hyperlipidemia type Lipid Panel     Component Value Date/Time   CHOL 205 (A) 07/10/2023 0000   TRIG 140 07/10/2023 0000   HDL 66 07/10/2023 0000   LDLCALC 115 07/10/2023 0000   The 10-year ASCVD risk score (Arnett DK, et al., 2019) is: 6.4%*   Values used to calculate the score:     Age: 68 years     Sex: Female     Is Non-Hispanic African American: No     Diabetic: No     Tobacco smoker: No     Systolic Blood Pressure: 122 mmHg     Is  BP treated: Yes     HDL Cholesterol: 66 mg/dL*     Total Cholesterol: 205 mg/dL*     * - Cholesterol units were assumed for this score calculation   She is on Crestor 10mg  daily- denis myalgias  01/08/2024 Cards Assessment Notes: Hyperlipidemia -last LDL is 115.  Her coronary calcium score was 38.3.  Her goal LDL is less than 70.  She has been taking her rosuvastatin on a daily basis.  She will have a labs with her primary medical doctor.  I would have a low threshold to add Zetia 10 mg a day if her LDL is not at goal.  We also could consider referring her to the lipid clinic for consideration of a PCSK9 inhibitor.   I have strongly encouraged her to continue working on weight loss and regular exercise.  3. Hypertension, unspecified type BP at goal She denies CP with exertion She is currently on rosuvastatin (CRESTOR) 10 MG tablet  losartan  (COZAAR ) 100 MG tablet  metoprolol  succinate (TOPROL -XL) 50 MG 24 hr tablet  amLODipine  (NORVASC ) 5 MG tablet   She is followed by Cards/Dr. Floria Mcguire Last Cards OV was Feb 2025, annual f/u  Assessment/Plan:   1. Vitamin D deficiency Check Labs this Summer  2. Hyperlipidemia, unspecified hyperlipidemia type Limit  fat intake Continue to increase regular cardiovascular exercise F/u with Cards annually and as directed  3. Hypertension, unspecified type (Primary) Continue to increase regular cardiovascular exercise Continue rosuvastatin (CRESTOR) 10 MG tablet  losartan  (COZAAR ) 100 MG tablet  metoprolol  succinate (TOPROL -XL) 50 MG 24 hr tablet  amLODipine  (NORVASC ) 5 MG tablet   F/u with Cards as directed (annually)  5. Obesity, CURRENT BMI 43.6  Carolyn Mcguire is currently in the action stage of change. As such, her goal is to continue with weight loss efforts. She has agreed to the Category 2 Plan.   Exercise goals: Older adults should follow the adult guidelines. When older adults cannot meet the adult guidelines, they should be as physically  active as their abilities and conditions will allow.  Older adults should do exercises that maintain or improve balance if they are at risk of falling.  Older adults should determine their level of effort for physical activity relative to their level of fitness.  Older adults with chronic conditions should understand whether and how their conditions affect their ability to do regular physical activity safely.  Behavioral modification strategies: increasing lean protein intake, decreasing simple carbohydrates, increasing vegetables, increasing water intake, keeping healthy foods in the home, ways to avoid boredom eating, ways to avoid night time snacking, and planning for success.  Carolyn Mcguire has agreed to follow-up with our clinic in 4 weeks. She was informed of the importance of frequent follow-up visits to maximize her success with intensive lifestyle modifications for her multiple health conditions.   Check Labs early Summer 2025  Objective:   Blood pressure 122/71, pulse 64, temperature 98.5 F (36.9 C), height 5\' 4"  (1.626 m), weight 249 lb (112.9 kg), SpO2 97%. Body mass index is 42.74 kg/m.  General: Cooperative, alert, well developed, in no acute distress. HEENT: Conjunctivae and lids unremarkable. Cardiovascular: Regular rhythm.  Lungs: Normal work of breathing. Neurologic: No focal deficits.   Lab Results  Component Value Date   CREATININE 0.9 10/18/2023   BUN 14 10/18/2023   NA 144 10/18/2023   K 4.0 10/18/2023   CL 104 10/18/2023   CO2 22 10/18/2023   Lab Results  Component Value Date   ALT 15 10/18/2023   AST 20 10/18/2023   ALKPHOS 116 10/18/2023   BILITOT 1.3 (H) 09/12/2022   No results found for: "HGBA1C" No results found for: "INSULIN" Lab Results  Component Value Date   TSH 1.267 09/16/2015   Lab Results  Component Value Date   CHOL 205 (A) 07/10/2023   HDL 66 07/10/2023   LDLCALC 115 07/10/2023   TRIG 140 07/10/2023   No results found for: "VD25OH" Lab  Results  Component Value Date   WBC 7.3 10/18/2023   HGB 13.9 10/18/2023   HCT 42 10/18/2023   MCV 88.4 09/12/2022   PLT 239 10/18/2023   No results found for: "IRON", "TIBC", "FERRITIN"  Attestation Statements:   Reviewed by clinician on day of visit: allergies, medications, problem list, medical history, surgical history, family history, social history, and previous encounter notes.  "25 minutes spent face-to-face with the patient discussing management of disordered eating symptoms, reviewing current medications, and providing strategies for coping with emotional eating."  I have reviewed the above documentation for accuracy and completeness, and I agree with the above. -  Ellee Wawrzyniak d. Jamale Spangler, NP-C

## 2024-04-14 DIAGNOSIS — A059 Bacterial foodborne intoxication, unspecified: Secondary | ICD-10-CM | POA: Diagnosis not present

## 2024-04-15 DIAGNOSIS — L718 Other rosacea: Secondary | ICD-10-CM | POA: Diagnosis not present

## 2024-04-15 DIAGNOSIS — L814 Other melanin hyperpigmentation: Secondary | ICD-10-CM | POA: Diagnosis not present

## 2024-04-15 DIAGNOSIS — L720 Epidermal cyst: Secondary | ICD-10-CM | POA: Diagnosis not present

## 2024-04-15 DIAGNOSIS — D225 Melanocytic nevi of trunk: Secondary | ICD-10-CM | POA: Diagnosis not present

## 2024-04-15 DIAGNOSIS — L821 Other seborrheic keratosis: Secondary | ICD-10-CM | POA: Diagnosis not present

## 2024-05-19 DIAGNOSIS — L718 Other rosacea: Secondary | ICD-10-CM | POA: Diagnosis not present

## 2024-06-02 ENCOUNTER — Ambulatory Visit (INDEPENDENT_AMBULATORY_CARE_PROVIDER_SITE_OTHER): Admitting: Adult Health

## 2024-06-04 ENCOUNTER — Ambulatory Visit (INDEPENDENT_AMBULATORY_CARE_PROVIDER_SITE_OTHER): Admitting: Adult Health

## 2024-06-04 ENCOUNTER — Encounter (INDEPENDENT_AMBULATORY_CARE_PROVIDER_SITE_OTHER): Payer: Self-pay

## 2024-06-10 DIAGNOSIS — R7303 Prediabetes: Secondary | ICD-10-CM | POA: Diagnosis not present

## 2024-06-10 DIAGNOSIS — I10 Essential (primary) hypertension: Secondary | ICD-10-CM | POA: Diagnosis not present

## 2024-06-10 DIAGNOSIS — E7800 Pure hypercholesterolemia, unspecified: Secondary | ICD-10-CM | POA: Diagnosis not present

## 2024-06-11 ENCOUNTER — Ambulatory Visit (INDEPENDENT_AMBULATORY_CARE_PROVIDER_SITE_OTHER): Admitting: Adult Health

## 2024-06-11 ENCOUNTER — Encounter (INDEPENDENT_AMBULATORY_CARE_PROVIDER_SITE_OTHER): Payer: Self-pay | Admitting: Adult Health

## 2024-06-11 VITALS — BP 131/75 | HR 69 | Temp 99.2°F | Ht 64.0 in | Wt 248.0 lb

## 2024-06-11 DIAGNOSIS — Z6841 Body Mass Index (BMI) 40.0 and over, adult: Secondary | ICD-10-CM | POA: Diagnosis not present

## 2024-06-11 DIAGNOSIS — E785 Hyperlipidemia, unspecified: Secondary | ICD-10-CM | POA: Diagnosis not present

## 2024-06-11 DIAGNOSIS — I10 Essential (primary) hypertension: Secondary | ICD-10-CM | POA: Diagnosis not present

## 2024-06-11 DIAGNOSIS — E669 Obesity, unspecified: Secondary | ICD-10-CM

## 2024-06-11 DIAGNOSIS — E559 Vitamin D deficiency, unspecified: Secondary | ICD-10-CM

## 2024-06-11 NOTE — Progress Notes (Signed)
 WEIGHT SUMMARY AND BIOMETRICS  Vitals Temp: 99.2 F (37.3 C) BP: 131/75 Pulse Rate: 69 SpO2: 97 %   Anthropometric Measurements Height: 5' 4 (1.626 m) Weight: 248 lb (112.5 kg) BMI (Calculated): 42.55 Weight at Last Visit: 249 lb Weight Lost Since Last Visit: 1 lb Weight Gained Since Last Visit: 0 Starting Weight: 256 lb Total Weight Loss (lbs): 8 lb (3.629 kg)   Body Composition  Body Fat %: 51.6 % Fat Mass (lbs): 128 lbs Muscle Mass (lbs): 114.2 lbs Total Body Water (lbs): 86.8 lbs Visceral Fat Rating : 18   Other Clinical Data Fasting: no Labs: no Today's Visit #: 7 Starting Date: 10/23/23    Chief Complaint:   OBESITY Carolyn Mcguire is here to discuss her progress with her obesity treatment plan.  She is on the the Category 2 Plan and states she is following her eating plan approximately 25 % of the time.  She states she is exercising Walking 20 minutes 3 times per week.  Interim History:  PCP increased her CCB dose yesterday for uncontrolled HTN. She is currently on daily Losartan  100mg , Metoprolol  50mg , and recently increased Amlodipine  10mg  BP stable at OV  Ms. Shawgo reports being able to follow the prescribed Meal Plan consistently the firs 1-2 weeks after an OV at [facility]. The following weeks until next OV, she will decrease compliance and weight loss is difficult. Discussed shortening time in between OV to help with adherence- she declined as she prefers not to travel when the heat is so severe.  Subjective:   1. Vitamin D deficiency She is on daily OTC Vit D3 1000 international units   2. Hyperlipidemia, unspecified hyperlipidemia type Lipid Panel     Component Value Date/Time   CHOL 205 (A) 07/10/2023 0000   TRIG 140 07/10/2023 0000   HDL 66 07/10/2023 0000   LDLCALC 115 07/10/2023 0000    She is on Co-Enzyme Q10, Rosuvastatin 10mg  She denies myalgias  3. Hypertension, unspecified type PCP increased her CCB dose yesterday for  uncontrolled HTN. She is currently on Losartan  100mg , Metoprolol  50mg , and recently increased Amlodipine  10mg  BP stable at OV  Assessment/Plan:   1. Vitamin D deficiency (Primary) Check Labs at next OV  2. Hyperlipidemia, unspecified hyperlipidemia type Check Labs at next OV Increase compliance of Cat 2 MP and increase regular walking (weather permitting)  3. Hypertension, unspecified type Check Labs at next OV Increase compliance of Cat 2 MP and increase regular walking (weather permitting)  4. Obesity, CURRENT BMI 42.6  [name] is currently in the action stage of change. As such, her goal is to continue with weight loss efforts. She has agreed to the Category 2 Plan.   Exercise goals: Older adults should follow the adult guidelines. When older adults cannot meet the adult guidelines, they should be as physically active as their abilities and conditions will allow.  Older adults should do exercises that maintain or improve balance if they are at risk of falling.  Older adults should determine their level of effort for physical activity relative to their level of fitness.  Older adults with chronic conditions should understand whether and how their conditions affect their ability to do regular physical activity safely.  Behavioral modification strategies: increasing lean protein intake, decreasing simple carbohydrates, increasing vegetables, increasing water intake, no skipping meals, meal planning and cooking strategies, keeping healthy foods in the home, ways to avoid boredom eating, and planning for success.  [name] has agreed to follow-up with our  clinic in 4 weeks. She was informed of the importance of frequent follow-up visits to maximize her success with intensive lifestyle modifications for her multiple health conditions.   Check Fasting Labs at next OV  Objective:   Blood pressure 131/75, pulse 69, temperature 99.2 F (37.3 C), height 5' 4 (1.626 m), weight 248 lb (112.5 kg),  SpO2 97%. Body mass index is 42.57 kg/m.  General: Cooperative, alert, well developed, in no acute distress. HEENT: Conjunctivae and lids unremarkable. Cardiovascular: Regular rhythm.  Lungs: Normal work of breathing. Neurologic: No focal deficits.   Lab Results  Component Value Date   CREATININE 0.9 10/18/2023   BUN 14 10/18/2023   NA 144 10/18/2023   K 4.0 10/18/2023   CL 104 10/18/2023   CO2 22 10/18/2023   Lab Results  Component Value Date   ALT 15 10/18/2023   AST 20 10/18/2023   ALKPHOS 116 10/18/2023   BILITOT 1.3 (H) 09/12/2022   No results found for: HGBA1C No results found for: INSULIN Lab Results  Component Value Date   TSH 1.267 09/16/2015   Lab Results  Component Value Date   CHOL 205 (A) 07/10/2023   HDL 66 07/10/2023   LDLCALC 115 07/10/2023   TRIG 140 07/10/2023   No results found for: VD25OH Lab Results  Component Value Date   WBC 7.3 10/18/2023   HGB 13.9 10/18/2023   HCT 42 10/18/2023   MCV 88.4 09/12/2022   PLT 239 10/18/2023   No results found for: IRON, TIBC, FERRITIN  Attestation Statements:   Reviewed by clinician on day of visit: allergies, medications, problem list, medical history, surgical history, family history, social history, and previous encounter notes.  Time spent on visit including pre-visit chart review and post-visit care and charting was 28 minutes.   I have reviewed the above documentation for accuracy and completeness, and I agree with the above. -  Nyana Haren d. Janessa Mickle, NP-C

## 2024-06-23 NOTE — Progress Notes (Unsigned)
 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  Further recommendations pending results.  At this point, she declines medications. Migraine rescue:  rizatriptan -MLT 10mg  Limit use of pain relievers to no more than 9 days out of the month to prevent risk of rebound or medication-overuse headache. Headache diary Follow up 8 months.       Subjective:  Carolyn Mcguire is a 68 year old female with HTN, diastolic dysfunction, HLD, hypothyroidism who follows up for migraines.  UPDATE: Referred to physical therapy for neck pain but wanted to wait until vestibular rehab finished.  She does not want to try CGRP inhibitor.    Vertigo did resolve.  But now she still notes brief vertigo with sudden head movements and feels off balance.    She continues to have a persistent headache.  It flares up 3 to 4 times a week.  She takes rizatriptan  and rests in dark and it resolves to baseline headache after a couple of hours.     Current NSAIDS/analgesics:  Tylenol  Arthritis (once every 2 weeks) Current triptans:  rizatriptan -MLT 10mg  Current ergotamine:  none Current anti-emetic:  none Current muscle relaxants:  none Current Antihypertensive medications:  metoprolol  succinate, amlodipine ,losartan  Current Antidepressant medications:  none Current Anticonvulsant medications:  none Current anti-CGRP:  none Current Vitamins/Herbal/Supplements:  CoQ10, KCl Current Antihistamines/Decongestants:  none Other therapy:  cold packs, hot shower Hormone/birth control:  none Has seen ENT for stapedius myoclonus.   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  Caffeine:  No coffee.  Drinks tea 1 to 2 times a week Diet:  Tries to increase water intake.  No soda.  She says she needs to lose weight.   Exercise: trying to walk more.   Depression:  some; Anxiety:  some Other pain:  generalized aches, arthritis Sleep hygiene:  Has mild to moderate OSA.  Does not use CPAP.  Sleep is good. 4-6 hours.  Feels tired in the day.  HISTORY: Onset:  Since around 68 years old Location:  diffuse, occasionally just temples or back of head.  Some neck pain (has arthritis in neck) Quality:  pounding Intensity:  8/10.  Aura:  absent Prodrome:  absent Associated symptoms:  Nausea, photophobia, phonophobia.  She denies associated visual disturbance, unilateral numbness or weakness. Duration:  a couple of hours to all day Frequency:  2 days a week Frequency of abortive medication:  Tylenol  Arthritis once a week.  Rarely rizatriptan  Triggers:  maybe change in weather, stress Relieving factors:  rest in dark room, hot shower, cool pack to head.   Activity:  aggravates - rests She does carry a baseline low-grade  pressure headache She also has chronic tinnitus of several years- constant ringing, roaring   MRI of brain without contrast on 02/09/2020 was normal.  Longstanding history of tinnitus and episodic positional vertigo.  MRI of internal auditory canals/posterior fossa with and without contrast on 04/25/2016 was unremarkable but incidentally demonstrated degenerative changes at C3-4 resulting in moderate spinal canal and bilateral foraminal stenosis.     Past NSAIDS/analgesics:  naproxen , meloxicam Past abortive triptans:  sumatriptan  50mg , rizatriptan  10mg  Past  abortive ergotamine:  none Past muscle relaxants:  baclofen , Robaxin  Past anti-emetic:  Zofran  8mg  Past antihypertensive medications:  furosemide , valsartan, HCTZ Past antidepressant medications:  venlafaxine , sertraline, bupropion Past  anticonvulsant medications:  topiramate, gabapentin  (leg swelling) Past anti-CGRP:  Ubrelvy (cannot swallow the tablet) Past vitamins/Herbal/Supplements:  none Past antihistamines/decongestants:  none Other past therapies:  trigger point injections in back of head/neck She does not want to try CGRP inhibitors, Botox  Family history of headache:  No   PAST MEDICAL HISTORY: Past Medical History:  Diagnosis Date   Adenomatous colon polyp    Allergic rhinitis    Animal bite 05/2015   fox    Anxiety    Arthritis    shoulder   Carpal tunnel syndrome    RIGHT HAND   Depression    Diastolic dysfunction    a. 2D echo 09/08/15: EF 55-60%, grade 1 DD, elevated LVEDP, mild MR, mild TR. - normal BNP 08/2015.   Essential hypertension    GERD (gastroesophageal reflux disease)    Headache    Heart murmur    Hepatic steatosis    Hyperlipidemia    Hypothyroidism    LBBB (left bundle branch block)    a. negative nuclear stress test 06/2011.   Migraines    Obesity    OSA (obstructive sleep apnea)    mild   Prediabetes    Tinnitus    Varicose vein of leg    Vitamin D deficiency     MEDICATIONS: Current Outpatient Medications on File Prior to Visit  Medication Sig Dispense Refill   amLODipine  (NORVASC ) 10 MG tablet Take 10 mg by mouth daily.     Cholecalciferol (VITAMIN D3) 1000 units CAPS Take by mouth. One daily     Co-Enzyme Q10 100 MG CAPS Take 100 mg by mouth 2 (two) times daily.     levothyroxine (SYNTHROID) 88 MCG tablet TAKE 1 TABLET BY MOUTH EVERY MORNING ON AN EMPTY STOMACH Orally Once a day     losartan  (COZAAR ) 100 MG tablet Take 100 mg by mouth daily. as directed     metoprolol  succinate (TOPROL -XL) 50 MG 24 hr tablet TAKE 1 TABLET BY MOUTH ONCE A DAY. TEVA [employer]URER ONLY Orally Once a day     ondansetron  (ZOFRAN -ODT) 4 MG disintegrating tablet 1 tablet on the tongue and allow to dissolve Orally three times a day; Duration: 2 days     rizatriptan  (MAXALT -MLT) 10 MG  disintegrating tablet DISSOLVE 1 TABLET IN MOUTH AS NEEDED FOR MIGRAINE. MAY REPEAT IN 2 HOURS IF NEEDED 6 tablet 0   rosuvastatin (CRESTOR) 10 MG tablet 1 tablet Orally Once a day; Duration: 90 days     No current facility-administered medications on file prior to visit.     ALLERGIES: Allergies  Allergen Reactions   Elemental Sulfur Rash    MAKES PT RED    FAMILY HISTORY: Family History  Problem Relation Age of Onset   Neuropathy Mother    Parkinsonism Mother    Ovarian cancer Mother        dx. 60-59; TAH-BSO at 31   Diabetes Mother    Pancreatic cancer Mother 16       d. 23   Skin cancer Mother        maybe basal cell carcinoma   Obesity Mother    Heart disease Father    Bladder Cancer Father        dx 55-81   Skin cancer Father  possibly melanoma; +sun exposure   Cancer Brother 57       salivary gland cancer; d. 12; no tobacco use   Breast cancer Maternal Aunt        dx unspecified age; s/p mastectomy   Other Maternal Aunt        intellectual disabilities   Heart failure Maternal Aunt 86       acute diastolic heart failure   Colon cancer Maternal Uncle        dx late 69s; d. early 70s   Heart attack Maternal Uncle 56   Cancer Paternal Uncle        CLL, dx. 55   Ovarian cancer Maternal Grandmother        d. 23; w/ mets   Heart attack Maternal Grandfather        d. 72   Esophageal cancer Neg Hx    Rectal cancer Neg Hx    Stomach cancer Neg Hx       Objective:  Blood pressure (!) 146/60, pulse 73, height 5' 4 (1.626 m), weight 252 lb (114.3 kg), SpO2 97%. General: No acute distress.  Patient appears well-groomed.      Juliene Dunnings, DO  CC: Ryan Hives, MD

## 2024-06-24 ENCOUNTER — Ambulatory Visit: Admitting: Neurology

## 2024-06-24 ENCOUNTER — Encounter: Payer: Self-pay | Admitting: Neurology

## 2024-06-24 VITALS — BP 146/60 | HR 73 | Ht 64.0 in | Wt 252.0 lb

## 2024-06-24 DIAGNOSIS — M4802 Spinal stenosis, cervical region: Secondary | ICD-10-CM | POA: Diagnosis not present

## 2024-06-24 DIAGNOSIS — R2681 Unsteadiness on feet: Secondary | ICD-10-CM

## 2024-06-24 DIAGNOSIS — G43709 Chronic migraine without aura, not intractable, without status migrainosus: Secondary | ICD-10-CM

## 2024-06-24 DIAGNOSIS — R42 Dizziness and giddiness: Secondary | ICD-10-CM

## 2024-06-24 NOTE — Patient Instructions (Signed)
 Due to cervical spinal stenosis and gait instability, check MRI of the cervical spine without contrast.  Further recommendations pending results.  Otherwise use rizatriptan  as needed.  Limit use of pain relievers to no more than 9 days out of the month to prevent risk of rebound or medication-overuse headache.

## 2024-06-30 ENCOUNTER — Ambulatory Visit (INDEPENDENT_AMBULATORY_CARE_PROVIDER_SITE_OTHER): Admitting: Adult Health

## 2024-07-01 ENCOUNTER — Ambulatory Visit (INDEPENDENT_AMBULATORY_CARE_PROVIDER_SITE_OTHER): Admitting: Adult Health

## 2024-07-01 DIAGNOSIS — Z8041 Family history of malignant neoplasm of ovary: Secondary | ICD-10-CM | POA: Diagnosis not present

## 2024-07-06 ENCOUNTER — Ambulatory Visit
Admission: RE | Admit: 2024-07-06 | Discharge: 2024-07-06 | Disposition: A | Discharge status: Home / Self Care | Source: Ambulatory Visit | Attending: Neurology

## 2024-07-06 DIAGNOSIS — R2681 Unsteadiness on feet: Secondary | ICD-10-CM

## 2024-07-06 DIAGNOSIS — M4802 Spinal stenosis, cervical region: Secondary | ICD-10-CM

## 2024-07-06 DIAGNOSIS — M5021 Other cervical disc displacement,  high cervical region: Secondary | ICD-10-CM | POA: Diagnosis not present

## 2024-07-06 DIAGNOSIS — M50223 Other cervical disc displacement at C6-C7 level: Secondary | ICD-10-CM | POA: Diagnosis not present

## 2024-07-15 ENCOUNTER — Ambulatory Visit: Payer: Self-pay | Admitting: Neurology

## 2024-07-15 DIAGNOSIS — I10 Essential (primary) hypertension: Secondary | ICD-10-CM | POA: Diagnosis not present

## 2024-07-15 DIAGNOSIS — E7800 Pure hypercholesterolemia, unspecified: Secondary | ICD-10-CM | POA: Diagnosis not present

## 2024-07-15 DIAGNOSIS — R7303 Prediabetes: Secondary | ICD-10-CM | POA: Diagnosis not present

## 2024-07-15 DIAGNOSIS — M4802 Spinal stenosis, cervical region: Secondary | ICD-10-CM

## 2024-07-15 NOTE — Progress Notes (Signed)
 LMOVM for patient to call the office back.

## 2024-07-15 NOTE — Telephone Encounter (Signed)
-----   Message from Juliene Lamar Dunnings sent at 07/15/2024  8:41 AM EDT ----- There is arthritis in the neck that is pressing on the spinal cord.  I would like to refer her to neurosurgery for their evaluation and opinion regarding treatment. ----- Message ----- From: Interface, Rad Results In Sent: 07/14/2024  11:37 PM EDT To: Juliene JONELLE Dunnings, DO

## 2024-07-22 ENCOUNTER — Ambulatory Visit (INDEPENDENT_AMBULATORY_CARE_PROVIDER_SITE_OTHER): Admitting: Adult Health

## 2024-07-28 ENCOUNTER — Telehealth: Payer: Self-pay | Admitting: Neurology

## 2024-07-28 NOTE — Telephone Encounter (Signed)
 Pt was calling to check on the status of the new neurosurgery referral. She wants to go to one in [facility] on [address]   Please call

## 2024-07-29 ENCOUNTER — Other Ambulatory Visit: Payer: Self-pay

## 2024-07-29 DIAGNOSIS — G4486 Cervicogenic headache: Secondary | ICD-10-CM

## 2024-07-29 DIAGNOSIS — M4802 Spinal stenosis, cervical region: Secondary | ICD-10-CM

## 2024-07-29 DIAGNOSIS — M47812 Spondylosis without myelopathy or radiculopathy, cervical region: Secondary | ICD-10-CM

## 2024-07-29 NOTE — Progress Notes (Signed)
 Ref[facility] closed, Per note Patient would like to go to a provider closer to home.

## 2024-07-29 NOTE — Telephone Encounter (Signed)
 This was sent this am by Sheena Michaels,CMA

## 2024-08-05 DIAGNOSIS — M4722 Other spondylosis with radiculopathy, cervical region: Secondary | ICD-10-CM | POA: Diagnosis not present

## 2024-08-05 DIAGNOSIS — G4486 Cervicogenic headache: Secondary | ICD-10-CM | POA: Diagnosis not present

## 2024-08-06 NOTE — Progress Notes (Signed)
 Appt scheduled for 08/05/24 at 10:30

## 2024-08-13 DIAGNOSIS — M4802 Spinal stenosis, cervical region: Secondary | ICD-10-CM | POA: Diagnosis not present

## 2024-08-13 DIAGNOSIS — R600 Localized edema: Secondary | ICD-10-CM | POA: Diagnosis not present

## 2024-08-13 DIAGNOSIS — I10 Essential (primary) hypertension: Secondary | ICD-10-CM | POA: Diagnosis not present

## 2024-08-20 DIAGNOSIS — Z1231 Encounter for screening mammogram for malignant neoplasm of breast: Secondary | ICD-10-CM | POA: Diagnosis not present

## 2024-08-21 ENCOUNTER — Emergency Department (HOSPITAL_BASED_OUTPATIENT_CLINIC_OR_DEPARTMENT_OTHER)

## 2024-08-21 ENCOUNTER — Emergency Department (HOSPITAL_BASED_OUTPATIENT_CLINIC_OR_DEPARTMENT_OTHER)
Admission: EM | Admit: 2024-08-21 | Discharge: 2024-08-21 | Disposition: A | Discharge status: Home / Self Care | Source: Ambulatory Visit | Attending: Emergency Medicine | Admitting: Emergency Medicine

## 2024-08-21 ENCOUNTER — Other Ambulatory Visit: Payer: Self-pay

## 2024-08-21 DIAGNOSIS — K5732 Diverticulitis of large intestine without perforation or abscess without bleeding: Secondary | ICD-10-CM | POA: Insufficient documentation

## 2024-08-21 DIAGNOSIS — K5792 Diverticulitis of intestine, part unspecified, without perforation or abscess without bleeding: Secondary | ICD-10-CM

## 2024-08-21 DIAGNOSIS — R509 Fever, unspecified: Secondary | ICD-10-CM | POA: Insufficient documentation

## 2024-08-21 DIAGNOSIS — D270 Benign neoplasm of right ovary: Secondary | ICD-10-CM | POA: Diagnosis not present

## 2024-08-21 DIAGNOSIS — R1032 Left lower quadrant pain: Secondary | ICD-10-CM | POA: Diagnosis not present

## 2024-08-21 LAB — URINALYSIS, ROUTINE W REFLEX MICROSCOPIC
Bilirubin Urine: NEGATIVE
Glucose, UA: NEGATIVE mg/dL
Hgb urine dipstick: NEGATIVE
Ketones, ur: NEGATIVE mg/dL
Leukocytes,Ua: NEGATIVE
Nitrite: NEGATIVE
Protein, ur: NEGATIVE mg/dL
Specific Gravity, Urine: 1.016 (ref 1.005–1.030)
pH: 5.5 (ref 5.0–8.0)

## 2024-08-21 LAB — COMPREHENSIVE METABOLIC PANEL WITH GFR
ALT: 9 U/L (ref 0–44)
AST: 17 U/L (ref 15–41)
Albumin: 4.4 g/dL (ref 3.5–5.0)
Alkaline Phosphatase: 109 U/L (ref 38–126)
Anion gap: 15 (ref 5–15)
BUN: 11 mg/dL (ref 8–23)
CO2: 24 mmol/L (ref 22–32)
Calcium: 9.9 mg/dL (ref 8.9–10.3)
Chloride: 102 mmol/L (ref 98–111)
Creatinine, Ser: 0.81 mg/dL (ref 0.44–1.00)
GFR, Estimated: >60 mL/min (ref >60)
Glucose, Bld: 140 mg/dL — ABNORMAL HIGH (ref 70–99)
Potassium: 3.9 mmol/L (ref 3.5–5.1)
Sodium: 141 mmol/L (ref 135–145)
Total Bilirubin: 1.5 mg/dL — ABNORMAL HIGH (ref 0.0–1.2)
Total Protein: 7.7 g/dL (ref 6.5–8.1)

## 2024-08-21 LAB — RESP PANEL BY RT-PCR (RSV, FLU A&B, COVID)  RVPGX2
Influenza A by PCR: NEGATIVE
Influenza B by PCR: NEGATIVE
Resp Syncytial Virus by PCR: NEGATIVE
SARS Coronavirus 2 by RT PCR: NEGATIVE

## 2024-08-21 LAB — CBC
HCT: 38.7 % (ref 36.0–46.0)
Hemoglobin: 13.1 g/dL (ref 12.0–15.0)
MCH: 29.6 pg (ref 26.0–34.0)
MCHC: 33.9 g/dL (ref 30.0–36.0)
MCV: 87.6 fL (ref 80.0–100.0)
Platelets: 223 K/uL (ref 150–400)
RBC: 4.42 MIL/uL (ref 3.87–5.11)
RDW: 12.4 % (ref 11.5–15.5)
WBC: 12.9 K/uL — ABNORMAL HIGH (ref 4.0–10.5)
nRBC: 0 % (ref 0.0–0.2)

## 2024-08-21 LAB — LIPASE, BLOOD: Lipase: 15 U/L (ref 11–51)

## 2024-08-21 MED ORDER — ONDANSETRON 4 MG PO TBDP: ORAL_TABLET | ORAL | 0 refills | Status: DC

## 2024-08-21 MED ORDER — IOHEXOL 300 MG/ML  SOLN
100.0000 mL | Freq: Once | INTRAMUSCULAR | Status: AC | PRN
  Administered 2024-08-21: 100 mL via INTRAVENOUS

## 2024-08-21 MED ORDER — AMOXICILLIN-POT CLAVULANATE 875-125 MG PO TABS: 1.0000 | ORAL_TABLET | Freq: Two times a day (BID) | ORAL | 0 refills | Status: DC

## 2024-08-21 NOTE — ED Triage Notes (Signed)
 Pt caox4 ambulatory NAD c/o LLQ abd pain since last night. States she hasn't felt well for the past week. Pt reports fever/chills, nausea yesterday.

## 2024-08-21 NOTE — ED Notes (Signed)
 Pt given discharge instructions and reviewed prescriptions. Opportunities given for questions. Pt verbalizes understanding. PIV removed x1. Bethena Powell SAUNDERS, RN

## 2024-08-21 NOTE — Discharge Instructions (Signed)
 1.  Start taking your antibiotics today.  Take exercise Tylenol  if needed for pain.  Take Zofran  if needed for nausea. 2.  Return to Emergency Department immediately if you have a fever, suddenly worsening pain, nausea and vomiting and cannot take your medication or other concerning changes. 3.  Schedule a recheck with your doctor in about 5 days to make sure your symptoms are improving.

## 2024-08-21 NOTE — ED Provider Notes (Signed)
 [place] EMERGENCY DEPARTMENT AT [facility] Provider Note   CSN: [id number] Arrival date & time: 08/21/24  9072     Patient presents with: Fever   Carolyn Mcguire is a 68 y.o. female.   HPI Patient reports has a prior history of diverticulitis.  She reports a couple days ago she started feeling a little unwell with some malaise.  She reports last night she had some chills and a fever to 100.6.  She reports she started developing pain yesterday evening on the left side of the lower abdomen and slightly to the suprapubic area.  She reports it feels similar to when she had diverticulitis.  She denies pain burning urgency with urination.  No vomiting.  No constipation.  No diarrhea.    Prior to Admission medications   Medication Sig Start Date End Date Taking? Authorizing Provider  celecoxib  (CELEBREX ) 100 MG capsule Take 100 mg by mouth daily. 08/11/24  Yes [provider]  amLODipine  (NORVASC ) 10 MG tablet Take 10 mg by mouth daily. 06/10/24   [provider]  amoxicillin -clavulanate (AUGMENTIN ) 875-125 MG tablet Take 1 tablet by mouth every 12 (twelve) hours. 08/21/24  Yes Armenta Canning, MD  Cholecalciferol (VITAMIN D3) 1000 units CAPS Take by mouth. One daily    [provider]  Co-Enzyme Q10 100 MG CAPS Take 100 mg by mouth 2 (two) times daily.    [provider]  levothyroxine (SYNTHROID) 88 MCG tablet TAKE 1 TABLET BY MOUTH EVERY MORNING ON AN EMPTY STOMACH Orally Once a day    [provider]  losartan  (COZAAR ) 100 MG tablet Take 100 mg by mouth daily. as directed    [provider]  metoprolol  succinate (TOPROL -XL) 50 MG 24 hr tablet TAKE 1 TABLET BY MOUTH ONCE A DAY. TEVA MANUFACTURER ONLY Orally Once a day    [provider]  ondansetron  (ZOFRAN -ODT) 4 MG disintegrating tablet 4mg  ODT q4 hours prn nausea/vomit 08/21/24  Yes Reinhard Schack, Canning, MD  rizatriptan  (MAXALT -MLT) 10 MG disintegrating tablet DISSOLVE 1  TABLET IN MOUTH AS NEEDED FOR MIGRAINE. MAY REPEAT IN 2 HOURS IF NEEDED 11/19/23   Skeet, Adam R, DO  rosuvastatin (CRESTOR) 10 MG tablet 1 tablet Orally Once a day; Duration: 90 days    [provider]    Allergies: Elemental sulfur    Review of Systems  Updated Vital Signs BP (!) 129/56 (BP Location: Right Arm)   Pulse 71   Temp 98.8 F (37.1 C) (Oral)   Resp 16   Ht 5' 4 (1.626 m)   Wt 113.4 kg   SpO2 98%   BMI 42.91 kg/m   Physical Exam Constitutional:      Comments: Alert nontoxic well in appearance.  HENT:     Mouth/Throat:     Pharynx: Oropharynx is clear.  Cardiovascular:     Rate and Rhythm: Normal rate and regular rhythm.  Pulmonary:     Effort: Pulmonary effort is normal.     Breath sounds: Normal breath sounds.  Abdominal:     Comments: Abdomen soft guarding.  Moderate tenderness suprapubic and left lower quadrant.  Abdominal wall otherwise normal.  Musculoskeletal:        General: No swelling or tenderness. Normal range of motion.     Right lower leg: No edema.     Left lower leg: No edema.  Skin:    General: Skin is warm and dry.  Neurological:     General: No focal deficit present.  Mental Status: She is oriented to person, place, and time.     Motor: No weakness.     Coordination: Coordination normal.  Psychiatric:        Mood and Affect: Mood normal.     (all labs ordered are listed, but only abnormal results are displayed) Labs Reviewed  COMPREHENSIVE METABOLIC PANEL WITH GFR - Abnormal; Notable for the following components:      Result Value   Glucose, Bld 140 (*)    Total Bilirubin 1.5 (*)    All other components within normal limits  CBC - Abnormal; Notable for the following components:   WBC 12.9 (*)    All other components within normal limits  RESP PANEL BY RT-PCR (RSV, FLU A&B, COVID)  RVPGX2  LIPASE, BLOOD  URINALYSIS, ROUTINE W REFLEX MICROSCOPIC    EKG: None  Radiology: CT ABDOMEN PELVIS W CONTRAST Result  Date: 08/21/2024 CLINICAL DATA:  Left lower quadrant abdominal pain since yesterday, fever and chills, nausea EXAM: CT ABDOMEN AND PELVIS WITH CONTRAST TECHNIQUE: Multidetector CT imaging of the abdomen and pelvis was performed using the standard protocol following bolus administration of intravenous contrast. RADIATION DOSE REDUCTION: This exam was performed according to the departmental dose-optimization program which includes automated exposure control, adjustment of the mA and/or kV according to patient size and/or use of iterative reconstruction technique. CONTRAST:  OMNIPAQUE  IOHEXOL  300 MG/ML  SOLN COMPARISON:  09/12/2022 FINDINGS: Lower chest: No acute pleural or parenchymal lung disease. Hepatobiliary: No focal liver abnormality is seen. No gallstones, gallbladder wall thickening, or biliary dilatation. Pancreas: Unremarkable. No pancreatic ductal dilatation or surrounding inflammatory changes. Spleen: Normal in size without focal abnormality. Adrenals/Urinary Tract: Adrenal glands are unremarkable. Kidneys are normal, without renal calculi, focal lesion, or hydronephrosis. Bladder is unremarkable. Stomach/Bowel: No bowel obstruction or ileus. Sigmoid diverticulosis, with short segment wall thickening and pericolonic fat stranding consistent with sigmoid diverticulitis. No perforation, fluid collection, or abscess. Normal appendix right lower quadrant. Vascular/Lymphatic: Aortic atherosclerosis. No enlarged abdominal or pelvic lymph nodes. Reproductive: Uterus is unremarkable. There is a small 9 mm fat containing focus within the right adnexa, consistent with small right ovarian dermoid. This appears unchanged since prior study. Unremarkable left ovary. Other: No free fluid or free intraperitoneal gas. No abdominal wall hernia. Musculoskeletal: No acute or destructive bony abnormalities. Reconstructed images demonstrate no additional findings. IMPRESSION: 1. Acute uncomplicated sigmoid diverticulitis.  No perforation, fluid collection, or abscess. 2.  Aortic Atherosclerosis (ICD10-I70.0). 3. Incidental 9 mm right ovarian dermoid, unchanged since prior exam. Electronically Signed   By: Ozell Daring M.D.   On: 08/21/2024 14:17     Procedures   Medications Ordered in the ED  iohexol  (OMNIPAQUE ) 300 MG/ML solution 100 mL (100 mLs Intravenous Contrast Given 08/21/24 1322)                                    Medical Decision Making Amount and/or Complexity of Data Reviewed Labs: ordered. Radiology: ordered.  Risk Prescription drug management.   Patient presents as outlined with low-grade fever measured at home 100.6.  Some general malaise and then followed by lower abdominal pain.  Will proceed with lab work and CT imaging.  Differential diagnosis includes UTI\pyelonephritis\diverticulitis.  Patient denies any need for pain medications at this time.  White count 12.9 H&H 13 and 38 comprehensive metabolic panel normal urinalysis negative.  CT interpreted radiology positive for diverticulitis without acute complication.  At this time patient is clinically well in appearance.  Vital signs are normal.  She does not have any vomiting.  Stable to take oral antibiotics on outpatient basis.  At this time we will prescribe Augmentin .  Patient denies need for narcotic pain medication.  She is advised to return immediately with worsening or suddenly worsening symptoms, vomiting or fever.  Patient voices understanding regarding the scope of illness that can be associated with diverticulitis.  Recommended follow-up in about 5 days.     Final diagnoses:  Diverticulitis    ED Discharge Orders          Ordered    amoxicillin -clavulanate (AUGMENTIN ) 875-125 MG tablet  Every 12 hours        08/21/24 1441    ondansetron  (ZOFRAN -ODT) 4 MG disintegrating tablet        08/21/24 1441               Armenta Canning, MD 08/21/24 1444

## 2024-08-26 DIAGNOSIS — H02831 Dermatochalasis of right upper eyelid: Secondary | ICD-10-CM | POA: Diagnosis not present

## 2024-08-26 DIAGNOSIS — H2513 Age-related nuclear cataract, bilateral: Secondary | ICD-10-CM | POA: Diagnosis not present

## 2024-08-26 DIAGNOSIS — H02834 Dermatochalasis of left upper eyelid: Secondary | ICD-10-CM | POA: Diagnosis not present

## 2024-09-02 DIAGNOSIS — D72829 Elevated white blood cell count, unspecified: Secondary | ICD-10-CM | POA: Diagnosis not present

## 2024-09-02 DIAGNOSIS — K5792 Diverticulitis of intestine, part unspecified, without perforation or abscess without bleeding: Secondary | ICD-10-CM | POA: Diagnosis not present

## 2024-09-07 DIAGNOSIS — M542 Cervicalgia: Secondary | ICD-10-CM | POA: Diagnosis not present

## 2024-09-15 ENCOUNTER — Ambulatory Visit

## 2024-09-15 ENCOUNTER — Encounter: Payer: Self-pay | Admitting: Podiatry

## 2024-09-15 ENCOUNTER — Ambulatory Visit: Admitting: Podiatry

## 2024-09-15 DIAGNOSIS — M7662 Achilles tendinitis, left leg: Secondary | ICD-10-CM | POA: Diagnosis not present

## 2024-09-15 DIAGNOSIS — M7752 Other enthesopathy of left foot: Secondary | ICD-10-CM | POA: Diagnosis not present

## 2024-09-15 DIAGNOSIS — M7661 Achilles tendinitis, right leg: Secondary | ICD-10-CM

## 2024-09-15 DIAGNOSIS — B070 Plantar wart: Secondary | ICD-10-CM

## 2024-09-15 DIAGNOSIS — M7751 Other enthesopathy of right foot: Secondary | ICD-10-CM

## 2024-09-15 DIAGNOSIS — M7731 Calcaneal spur, right foot: Secondary | ICD-10-CM

## 2024-09-15 DIAGNOSIS — M7732 Calcaneal spur, left foot: Secondary | ICD-10-CM

## 2024-09-15 MED ORDER — PREDNISONE 5 MG PO TABS: ORAL_TABLET | ORAL | 1 refills | Status: AC

## 2024-09-15 NOTE — Progress Notes (Addendum)
 Patient presents with complaint of pain in the posterior aspect of the heels bilaterally.  Has been bothering him mostly in the morning where she puts pressure on it.  Pain with standing on it for a while.  Also complains of a painful lesion on the plantar aspect of the left foot that is giving her pain.  Hurts with walking but there were no bare feet or shoes.  Does not recall any injury to the area.  Physical exam:  General appearance: Pleasant, and in no acute distress. AOx3.  Vascular: Pedal pulses: DP 2/4 bilaterally, PT 2/4 bilaterally. Mild edema lower legs bilaterally. Capillary fill time immediate bilaterally.  Neurological: Light touch intact feet bilaterally.  Normal Achilles reflex bilaterally.  No clonus or spasticity noted.  Negative Tinel sign tarsal tunnel and porta pedis bilaterally  Dermatologic:   Verrucous lesion plantar left foot with pinpoint hemorrhages and skin lines going around the lesion.  Sometimes lateral pressure on the lesion.  Skin normal temperature bilaterally.  Skin normal color, tone, and texture bilaterally.   Musculoskeletal: Tenderness posterior aspect heel and distal 2 to 3 cm of Achilles tendion bilaterally.  Radiographs: 3 views foot bilaterally: Osteophytic changes plantar and posterior aspect calcaneus.  No is any fractures.  Notes and dislocations.  Normal Kagar's triangle.  No gross changes in calcaneus.  Diagnosis: Plantar verruca left 2.  Achilles tendinitis bilaterally 3.  Calcaneal spurs bilateral  Plan: -Established office visit for evaluation and management.  Level 3.  Modifier 25. -Established office visit today separate from procedure done for the plantar verruca on the left.  Discussed with the Achilles tendinitis etiology and treatment.  She has already been using Voltaren gel on it.  Encouraged her to ice it 20 minutes an hour several times a day.  Will also place her on prednisone for this.  She says she does not do well with  injections.  Discussed proper shoes avoid flat soled shoes or going in bare feet. -Rx prednisone 5 mg, 30 mg p.o. daily first day, then decrease by 5 mg every other day for 12 days.  -Discussed verruca and etiology and treatment.  Discussed various treatment options.  -Applied Salinocaine compound to lesion(s) as noted in physical exam after debriding lesions to pinpoint bleeding.  Salinocaine applied to lesion(s) and covered with an occlusive dressing with Coban wrap.  Written and oral instructions given to patient.   Return 2 weeks follow-up Achilles tendinitis bilaterally and wart left

## 2024-09-16 ENCOUNTER — Other Ambulatory Visit: Payer: Self-pay | Admitting: Podiatry

## 2024-09-16 ENCOUNTER — Telehealth: Payer: Self-pay | Admitting: Podiatry

## 2024-09-16 DIAGNOSIS — R7303 Prediabetes: Secondary | ICD-10-CM | POA: Diagnosis not present

## 2024-09-16 DIAGNOSIS — M542 Cervicalgia: Secondary | ICD-10-CM | POA: Diagnosis not present

## 2024-09-16 DIAGNOSIS — E7800 Pure hypercholesterolemia, unspecified: Secondary | ICD-10-CM | POA: Diagnosis not present

## 2024-09-16 DIAGNOSIS — I10 Essential (primary) hypertension: Secondary | ICD-10-CM | POA: Diagnosis not present

## 2024-09-16 DIAGNOSIS — Z23 Encounter for immunization: Secondary | ICD-10-CM | POA: Diagnosis not present

## 2024-09-16 MED ORDER — PREDNISONE 5 MG PO TABS: ORAL_TABLET | ORAL | 0 refills | Status: AC

## 2024-09-16 NOTE — Telephone Encounter (Signed)
 Patient called and would like her prescription for prednisone sent to the [facility] pharmacy on [address].

## 2024-09-24 ENCOUNTER — Other Ambulatory Visit: Payer: Self-pay

## 2024-09-24 ENCOUNTER — Emergency Department (HOSPITAL_BASED_OUTPATIENT_CLINIC_OR_DEPARTMENT_OTHER)

## 2024-09-24 ENCOUNTER — Encounter (HOSPITAL_BASED_OUTPATIENT_CLINIC_OR_DEPARTMENT_OTHER): Payer: Self-pay | Admitting: Emergency Medicine

## 2024-09-24 ENCOUNTER — Emergency Department (HOSPITAL_BASED_OUTPATIENT_CLINIC_OR_DEPARTMENT_OTHER)
Admission: EM | Admit: 2024-09-24 | Discharge: 2024-09-24 | Disposition: A | Discharge status: Home / Self Care | Attending: Emergency Medicine | Admitting: Emergency Medicine

## 2024-09-24 DIAGNOSIS — I10 Essential (primary) hypertension: Secondary | ICD-10-CM | POA: Diagnosis not present

## 2024-09-24 DIAGNOSIS — R1032 Left lower quadrant pain: Secondary | ICD-10-CM | POA: Diagnosis not present

## 2024-09-24 DIAGNOSIS — R1085 Abdominal pain of multiple sites: Secondary | ICD-10-CM | POA: Diagnosis not present

## 2024-09-24 DIAGNOSIS — K5730 Diverticulosis of large intestine without perforation or abscess without bleeding: Secondary | ICD-10-CM | POA: Diagnosis not present

## 2024-09-24 DIAGNOSIS — R1030 Lower abdominal pain, unspecified: Secondary | ICD-10-CM

## 2024-09-24 DIAGNOSIS — R1031 Right lower quadrant pain: Secondary | ICD-10-CM | POA: Diagnosis not present

## 2024-09-24 DIAGNOSIS — Z79899 Other long term (current) drug therapy: Secondary | ICD-10-CM | POA: Insufficient documentation

## 2024-09-24 LAB — URINALYSIS, W/ REFLEX TO CULTURE (INFECTION SUSPECTED)
Bacteria, UA: NONE SEEN
Bilirubin Urine: NEGATIVE
Glucose, UA: NEGATIVE mg/dL
Hgb urine dipstick: NEGATIVE
Ketones, ur: NEGATIVE mg/dL
Leukocytes,Ua: NEGATIVE
Nitrite: NEGATIVE
Protein, ur: NEGATIVE mg/dL
Specific Gravity, Urine: 1.009 (ref 1.005–1.030)
pH: 6.5 (ref 5.0–8.0)

## 2024-09-24 LAB — COMPREHENSIVE METABOLIC PANEL WITH GFR
ALT: 11 U/L (ref 0–44)
AST: 20 U/L (ref 15–41)
Albumin: 4.5 g/dL (ref 3.5–5.0)
Alkaline Phosphatase: 94 U/L (ref 38–126)
Anion gap: 12 (ref 5–15)
BUN: 13 mg/dL (ref 8–23)
CO2: 27 mmol/L (ref 22–32)
Calcium: 9.9 mg/dL (ref 8.9–10.3)
Chloride: 104 mmol/L (ref 98–111)
Creatinine, Ser: 0.79 mg/dL (ref 0.44–1.00)
GFR, Estimated: >60 mL/min (ref >60)
Glucose, Bld: 130 mg/dL — ABNORMAL HIGH (ref 70–99)
Potassium: 3.7 mmol/L (ref 3.5–5.1)
Sodium: 142 mmol/L (ref 135–145)
Total Bilirubin: 1.2 mg/dL (ref 0.0–1.2)
Total Protein: 7.2 g/dL (ref 6.5–8.1)

## 2024-09-24 LAB — CBC WITH DIFFERENTIAL/PLATELET
Abs Immature Granulocytes: 0.02 K/uL (ref 0.00–0.07)
Basophils Absolute: 0 K/uL (ref 0.0–0.1)
Basophils Relative: 0 %
Eosinophils Absolute: 0.1 K/uL (ref 0.0–0.5)
Eosinophils Relative: 1 %
HCT: 39.6 % (ref 36.0–46.0)
Hemoglobin: 13.7 g/dL (ref 12.0–15.0)
Immature Granulocytes: 0 %
Lymphocytes Relative: 15 %
Lymphs Abs: 1.1 K/uL (ref 0.7–4.0)
MCH: 29.7 pg (ref 26.0–34.0)
MCHC: 34.6 g/dL (ref 30.0–36.0)
MCV: 85.9 fL (ref 80.0–100.0)
Monocytes Absolute: 0.4 K/uL (ref 0.1–1.0)
Monocytes Relative: 6 %
Neutro Abs: 5.8 K/uL (ref 1.7–7.7)
Neutrophils Relative %: 78 %
Platelets: 181 K/uL (ref 150–400)
RBC: 4.61 MIL/uL (ref 3.87–5.11)
RDW: 12.1 % (ref 11.5–15.5)
WBC: 7.4 K/uL (ref 4.0–10.5)
nRBC: 0 % (ref 0.0–0.2)

## 2024-09-24 LAB — LIPASE, BLOOD: Lipase: 15 U/L (ref 11–51)

## 2024-09-24 MED ORDER — ONDANSETRON HCL 4 MG/2ML IJ SOLN: 4.0000 mg | Freq: Four times a day (QID) | INTRAMUSCULAR | Status: DC | PRN

## 2024-09-24 MED ORDER — MORPHINE SULFATE (PF) 2 MG/ML IV SOLN: 2.0000 mg | Freq: Once | INTRAVENOUS | Status: DC

## 2024-09-24 MED ORDER — ONDANSETRON HCL 4 MG/2ML IJ SOLN: 4.0000 mg | Freq: Once | INTRAMUSCULAR | Status: DC

## 2024-09-24 MED ORDER — IOHEXOL 300 MG/ML  SOLN: 100.0000 mL | Freq: Once | INTRAMUSCULAR | Status: DC | PRN

## 2024-09-24 MED ORDER — ONDANSETRON HCL 4 MG PO TABS: 4.0000 mg | ORAL_TABLET | Freq: Three times a day (TID) | ORAL | 0 refills | Status: DC | PRN

## 2024-09-24 MED ORDER — DICYCLOMINE HCL 20 MG PO TABS: 10.0000 mg | ORAL_TABLET | Freq: Two times a day (BID) | ORAL | 0 refills | Status: DC | PRN

## 2024-09-24 MED ORDER — IOHEXOL 300 MG/ML  SOLN
100.0000 mL | Freq: Once | INTRAMUSCULAR | Status: AC | PRN
  Administered 2024-09-24: 100 mL via INTRAVENOUS

## 2024-09-24 NOTE — ED Triage Notes (Signed)
 Pt reports generalized lower abd pain, pain worse in RLQ with nausea x 3 days. Hx of diverticulitis. Denies pain at this time, endorses decreased po intake

## 2024-09-24 NOTE — ED Notes (Signed)
 Patient states not able to provide urine sample at this time. Labeled cup left at bedside. Patient instructed to hit call bell when able to provide sample.

## 2024-09-24 NOTE — ED Notes (Signed)
 Pt ambulatory with steady gait to restroom

## 2024-09-24 NOTE — ED Provider Notes (Signed)
 [place] EMERGENCY DEPARTMENT AT [facility] Provider Note   CSN: [id number] Arrival date & time: 09/24/24  9173     History Chief Complaint  Patient presents with   Abdominal Pain    HPI: Carolyn Mcguire is a 68 y.o. female with history pertinent HTN, HLD, diastolic dysfunction, OSA, elevated BMI, LBBB, hepatic steatosis, prediabetes who presents complaining of abdominal pain. Patient arrived via POV.  History provided by patient.  No interpreter required during this encounter.  Patient reports that she has a past medical history of diverticulitis, and states that current symptoms feel similar.  Reports that she first developed pain in her right lower quadrant 3 days ago while walking up the stairs, reports that since then it is generalized to her whole abdomen, though waxing wanes in intensity.  Reports that she previously had left-sided diverticulitis that overall felt similar.  Denies any fever.  Reports that she has had some recent subjective chills.  Denies chest pain, shortness of breath, dysuria, hematuria, vaginal bleeding, vaginal discharge, melena, hematochezia, diarrhea.  Reports that her most recent prior stool was yesterday and was formed.  Reports that she does have partial relief in symptoms with having a bowel movement.  Denies vomiting, however endorses nausea, most recent p.o. intake was water this morning and taking her Synthroid.  Given the persistence of her pain and similar to prior episode of diverticulitis she became concerned for recurrent diverticulitis and decided to come to the emergency department for reevaluation.  Patient's recorded medical, surgical, social, medication list and allergies were reviewed in the Snapshot window as part of the initial history.   Prior to Admission medications   Medication Sig Start Date End Date Taking? Authorizing Provider  dicyclomine (BENTYL) 20 MG tablet Take 0.5 tablets (10 mg total) by mouth 2 (two) times daily as  needed for spasms. 09/24/24  Yes Rogelia Jerilynn RAMAN, MD  ondansetron  (ZOFRAN ) 4 MG tablet Take 1 tablet (4 mg total) by mouth every 8 (eight) hours as needed for nausea or vomiting. 09/24/24  Yes Rogelia Jerilynn RAMAN, MD  predniSONE (DELTASONE) 5 MG tablet prednisone 5 mg, 30 mg p.o. daily first day, then decrease by 5 mg every other day for 12 days. 09/16/24   Christine Rush, DPM  amLODipine  (NORVASC ) 10 MG tablet Take 10 mg by mouth daily. 06/10/24   [provider]  amoxicillin -clavulanate (AUGMENTIN ) 875-125 MG tablet Take 1 tablet by mouth every 12 (twelve) hours. 08/21/24   Armenta Canning, MD  celecoxib  (CELEBREX ) 100 MG capsule Take 100 mg by mouth daily. 08/11/24   [provider]  Cholecalciferol (VITAMIN D3) 1000 units CAPS Take by mouth. One daily    [provider]  Co-Enzyme Q10 100 MG CAPS Take 100 mg by mouth 2 (two) times daily.    [provider]  levothyroxine (SYNTHROID) 88 MCG tablet TAKE 1 TABLET BY MOUTH EVERY MORNING ON AN EMPTY STOMACH Orally Once a day    [provider]  losartan  (COZAAR ) 100 MG tablet Take 100 mg by mouth daily. as directed    [provider]  metoprolol  succinate (TOPROL -XL) 50 MG 24 hr tablet TAKE 1 TABLET BY MOUTH ONCE A DAY. TEVA MANUFACTURER ONLY Orally Once a day    [provider]  ondansetron  (ZOFRAN -ODT) 4 MG disintegrating tablet 4mg  ODT q4 hours prn nausea/vomit 08/21/24   Armenta Canning, MD  predniSONE (DELTASONE) 5 MG tablet prednisone 5 mg, 30 mg p.o. daily first day, then decrease by 5 mg  every other day for 12 days. 09/15/24   Christine Rush, DPM  rizatriptan  (MAXALT -MLT) 10 MG disintegrating tablet DISSOLVE 1 TABLET IN MOUTH AS NEEDED FOR MIGRAINE. MAY REPEAT IN 2 HOURS IF NEEDED 11/19/23   Skeet, Adam R, DO  rosuvastatin (CRESTOR) 10 MG tablet 1 tablet Orally Once a day; Duration: 90 days    [provider]     Allergies: Elemental sulfur   Review of Systems   ROS as per  HPI  Physical Exam Updated Vital Signs BP (!) 132/58   Pulse 66   Temp 98.2 F (36.8 C) (Oral)   Resp 15   Wt 111.1 kg   SpO2 97%   BMI 42.05 kg/m  Physical Exam Vitals and nursing note reviewed.  Constitutional:      General: She is not in acute distress.    Appearance: Normal appearance.  HENT:     Head: Normocephalic and atraumatic.  Eyes:     Extraocular Movements: Extraocular movements intact.  Cardiovascular:     Rate and Rhythm: Normal rate.     Pulses: Normal pulses.  Pulmonary:     Effort: Pulmonary effort is normal.  Abdominal:     Tenderness: There is abdominal tenderness (Mild to moderate, bilateral lower quadrants) in the right lower quadrant and left lower quadrant.  Skin:    General: Skin is warm and dry.     Capillary Refill: Capillary refill takes less than 2 seconds.  Neurological:     General: No focal deficit present.     Mental Status: She is alert and oriented to person, place, and time.     ED Course/ Medical Decision Making/ A&P    Procedures Procedures   Medications Ordered in ED Medications  ondansetron  (ZOFRAN ) injection 4 mg (4 mg Intravenous Not Given 09/24/24 0909)    Followed by  ondansetron  (ZOFRAN ) injection 4 mg (has no administration in time range)  iohexol  (OMNIPAQUE ) 300 MG/ML solution 100 mL (100 mLs Intravenous Contrast Given 09/24/24 1016)    Medical Decision Making:   [name] is a 68 y.o. female who presents for abdominal pain as per above.  Physical exam is pertinent for mild to moderate right and left lower quadrant pain.   The differential includes but is not limited to diverticulitis, appendicitis, abscess, perforation, ovarian pathology, bowel obstruction, enteritis, pancreatitis, cystitis, pyelonephritis, nephrolithiasis.  Independent historian: None  External data reviewed: No pertinent external data  Initial Plan:  Screening labs including CBC and Metabolic panel to evaluate for infectious or  metabolic etiology of disease.  Urinalysis with reflex culture ordered to evaluate for UTI or relevant urologic/nephrologic pathology.  Screening lipase to evaluate for pancreatitis CT abdomen pelvis to evaluate for structural/infectious intra-abdominal pathology.  EKG to evaluate for cardiac pathology Objective evaluation as below reviewed   Labs: Ordered, Independent interpretation, and Details: CBC without leukocytosis, anemia, thrombocytopenia.  CMP without AKI, emergent derangement, emergent LFT abnormality.  Lipase WNL.  UA without UTI  Radiology: Ordered, Independent interpretation, Details: Personally viewed CT of the abdomen and pelvis, do not appreciate free air, free fluid, mass lesion, obstructive bowel gas pattern, fat stranding or hyperenhancement, and All images reviewed independently.  Agree with radiology report at this time.   CT ABDOMEN PELVIS W CONTRAST Result Date: 09/24/2024 EXAM: CT ABDOMEN AND PELVIS WITH CONTRAST 09/24/2024 10:22:35 AM TECHNIQUE: CT of the abdomen and pelvis was performed with the administration of 100 mL of iohexol  (OMNIPAQUE ) 300 MG/ML solution. Multiplanar reformatted images are provided for  review. Automated exposure control, iterative reconstruction, and/or weight-based adjustment of the mA/kV was utilized to reduce the radiation dose to as low as reasonably achievable. COMPARISON: 08/21/2024 CLINICAL HISTORY: LLQ abdominal pain; RLQ abdominal pain. FINDINGS: LOWER CHEST: No acute abnormality. LIVER: The liver is unremarkable. GALLBLADDER AND BILE DUCTS: Gallbladder is unremarkable. No biliary ductal dilatation. SPLEEN: No acute abnormality. PANCREAS: No acute abnormality. ADRENAL GLANDS: No acute abnormality. KIDNEYS, URETERS AND BLADDER: No stones in the kidneys or ureters. No hydronephrosis. No perinephric or periureteral stranding. Urinary bladder is unremarkable. GI AND BOWEL: Sigmoid diverticulosis without acute inflammation. Stomach demonstrates no  acute abnormality. There is no bowel obstruction. PERITONEUM AND RETROPERITONEUM: No ascites. No free air. VASCULATURE: Aortic atherosclerosis stable. Aorta is normal in caliber. LYMPH NODES: No lymphadenopathy. REPRODUCTIVE ORGANS: Probable small right ovarian dermoid. BONES AND SOFT TISSUES: No acute osseous abnormality. No focal soft tissue abnormality. IMPRESSION: 1. No acute findings. 2. Sigmoid diverticulosis without acute inflammation. 3. Probable small right ovarian dermoid. Electronically signed by: Lynwood Seip MD 09/24/2024 10:46 AM EDT RP Workstation: [id number]   DG Foot Complete Right Result Date: 09/15/2024 Please see detailed radiograph report in office note.  DG Foot Complete Left Result Date: 09/15/2024 Please see detailed radiograph report in office note.   EKG/Medicine tests: Ordered and Independent interpretation EKG Interpretation: Sinus rhythm Nonspecific IVCD with LAD Left ventricular hypertrophy Anterior Q waves, possibly due to LVH No significant change since last tracing Confirmed by Rogelia Satterfield ([id number]) on 09/24/2024 9:44:09 AM  Interventions: None  See the EMR for full details regarding lab and imaging results.  On exam, patient vitally stable, abdomen does have mild to moderate lower abdominal tenderness, therefore do feel that labs and imaging are reasonable.  Patient was offered Zofran  and morphine for pain and nausea control, however felt that symptoms were sufficiently controlled without these interventions, therefore they were not administered while patient was in the ED.  Labs obtained and lipase WNL, doubt pancreatitis, no evidence of UTI on UA.  No evidence of pyelonephritis, nephrolithiasis on CT, no hematuria on UA.  Patient without significant dehydration as evidenced by normal electrolytes and no AKI.  Patient without leukocytosis which makes severe infectious or inflammatory intra-abdominal pathology less likely.  CT does not reveal acute  intra-abdominal pathology, has diverticulosis without diverticulitis which patient is aware of.  Radiology does note right sided ovarian cyst, patient reports that she is previously aware of this abnormality, patient does not have waxing and waning severe pain in the right lower quadrant, therefore not consistent with ovarian torsion, do not feel that patient warrants pelvic ultrasound at this time.  Patient agreeable to following up with her usual outpatient OB/GYN for further evaluation of her ovarian cyst.  Given reassuring workup, feel that patient is appropriate for discharge and outpatient follow-up, prescribed courses of Bentyl and Zofran , patient is comfortable with this plan.  Presentation is most consistent with acute complicated illness and I did consider and rule out acute life/limb-threatening illness  Discussion of management or test interpretations with external provider(s): Not indicated  Risk Drugs:Prescription drug management  Disposition: DISCHARGE: I believe that the patient is safe for discharge home with outpatient follow-up. Patient was informed of all pertinent physical exam, laboratory, and imaging findings including right ovarian cyst which patient was aware of Patient's suspected etiology of their symptom presentation was discussed with the patient and all questions were answered. We discussed following up with PCP, OB/GYN. I provided thorough ED return precautions. The patient feels safe and  comfortable with this plan.  MDM generated using voice dictation software and may contain dictation errors.  Please contact me for any clarification or with any questions.  Clinical Impression:  1. Lower abdominal pain      Discharge   Final Clinical Impression(s) / ED Diagnoses Final diagnoses:  Lower abdominal pain    Rx / DC Orders ED Discharge Orders          Ordered    dicyclomine (BENTYL) 20 MG tablet  2 times daily PRN        09/24/24 1119    ondansetron  (ZOFRAN )  4 MG tablet  Every 8 hours PRN        09/24/24 1119             Rogelia Jerilynn RAMAN, MD 09/24/24 1144

## 2024-09-24 NOTE — Discharge Instructions (Signed)
 Arland LITTIE Clover  Thank you for allowing us  to take care of you today.  You came to the Emergency Department today because you have pain in the lower part of your belly for about 2 days.  Here in the emergency department your exam and vitals were reassuring.  We did labs as well as a CT.  The labs showed that you do not have a urine infection, your liver, kidney, pancreas numbers are normal.  Your blood salts are normal and blood counts are normal.  Your CT shows the cyst on your right ovary that you are aware of, it also shows diverticulosis which is the outpouching of parts of your colon, however no evidence of diverticulitis which is the inflammation of these outpouchings.  Your workup does not show the cause of your pain, however it rules out the emergency causes abdominal pain, therefore you are safe to continue your workup outpatient with your primary care doctor.  Will prescribe you a pain medication called Bentyl that you can take twice a day as needed for abdominal pain, as well as Zofran  which you can take every 8 hours as needed for nausea and vomiting.  To-Do: 1. Please follow-up with your primary doctor within 7 days / as soon as possible.   Please return to the Emergency Department or call 911 if you experience have worsening of your symptoms, or do not get better, inability to keep down water by mouth despite Zofran , chest pain, shortness of breath, severe or significantly worsening pain, high fever, severe confusion, pass out or have any reason to think that you need emergency medical care.   We hope you feel better soon.   Mitzie Later, MD Department of Emergency Medicine [facility]  CLINICAL HISTORY:  LLQ abdominal pain; RLQ abdominal pain.    FINDINGS:    LOWER CHEST:  No acute abnormality.    LIVER:  The liver is unremarkable.    GALLBLADDER AND BILE DUCTS:  Gallbladder is unremarkable. No biliary ductal dilatation.    SPLEEN:  No acute  abnormality.    PANCREAS:  No acute abnormality.    ADRENAL GLANDS:  No acute abnormality.    KIDNEYS, URETERS AND BLADDER:  No stones in the kidneys or ureters. No hydronephrosis. No perinephric or  periureteral stranding. Urinary bladder is unremarkable.    GI AND BOWEL:  Sigmoid diverticulosis without acute inflammation. Stomach demonstrates no  acute abnormality. There is no bowel obstruction.    PERITONEUM AND RETROPERITONEUM:  No ascites. No free air.    VASCULATURE:  Aortic atherosclerosis stable. Aorta is normal in caliber.    LYMPH NODES:  No lymphadenopathy.    REPRODUCTIVE ORGANS:  Probable small right ovarian dermoid.    BONES AND SOFT TISSUES:  No acute osseous abnormality. No focal soft tissue abnormality.    IMPRESSION:  1. No acute findings.  2. Sigmoid diverticulosis without acute inflammation.  3. Probable small right ovarian dermoid.    Electronically signed by: Lynwood Seip MD 09/24/2024 10:46 AM EDT RP  Workstation: [id number]

## 2024-09-28 DIAGNOSIS — D270 Benign neoplasm of right ovary: Secondary | ICD-10-CM | POA: Diagnosis not present

## 2024-09-28 DIAGNOSIS — R1031 Right lower quadrant pain: Secondary | ICD-10-CM | POA: Diagnosis not present

## 2024-09-28 DIAGNOSIS — D1803 Hemangioma of intra-abdominal structures: Secondary | ICD-10-CM | POA: Diagnosis not present

## 2024-09-28 DIAGNOSIS — E041 Nontoxic single thyroid nodule: Secondary | ICD-10-CM | POA: Diagnosis not present

## 2024-09-29 ENCOUNTER — Encounter: Payer: Self-pay | Admitting: Podiatry

## 2024-09-29 ENCOUNTER — Ambulatory Visit: Admitting: Podiatry

## 2024-09-29 DIAGNOSIS — B070 Plantar wart: Secondary | ICD-10-CM

## 2024-09-29 DIAGNOSIS — M542 Cervicalgia: Secondary | ICD-10-CM | POA: Diagnosis not present

## 2024-09-29 NOTE — Progress Notes (Signed)
 Patient presents for follow-up of plantar verruca left and Achilles tendinitis bilaterally.  Was unable to start taking prednisone should go to the ER with some abdominal pain.  They think it might have been a flare of her diverticulitis but they are not sure.  They told her to hold off for a week or 2 before taking prednisone.  She just started the prednisone.  Says her verrucous lesion site is feeling much better on the left.  Not as much pain as before   Physical exam:  General appearance: Pleasant, and in no acute distress. AOx3.  Vascular: Pedal pulses: DP 2/4 bilaterally, PT 2/4 bilaterally. Moderate edema lower legs bilaterally. Capillary fill time bilaterally.  Neurological: Grossly intact bilaterally  Dermatologic:   Verrucous lesion plantar lateral forefoot left with skin lines going around the lesion and 1 pinpoint hemorrhage.  Improved and reduced in size.  Skin normal temperature bilaterally.  Skin normal color, tone, and texture bilaterally.   Musculoskeletal: Some tenderness posterior aspect heel left greater than right bilaterally.  No defects in Achilles tendon noted.    Diagnosis: 1.  Plantar verruca left -improved  Plan: -Will do second application of Salinocaine to verrucous lesion of the left today. -Take the prednisone taper 12-day taper as prescribed.  -Applied Salinocaine compound to lesion(s) as noted in physical exam after debriding lesions to pinpoint bleeding.  Salinocaine applied to lesion(s) and covered with an occlusive dressing with Coban wrap.  Written and oral instructions given to patient.    Return 2 weeks follow-up wart left and Achilles tendinitis bilateral

## 2024-09-30 DIAGNOSIS — R1032 Left lower quadrant pain: Secondary | ICD-10-CM | POA: Diagnosis not present

## 2024-09-30 DIAGNOSIS — R1031 Right lower quadrant pain: Secondary | ICD-10-CM | POA: Diagnosis not present

## 2024-09-30 DIAGNOSIS — D270 Benign neoplasm of right ovary: Secondary | ICD-10-CM | POA: Diagnosis not present

## 2024-10-08 DIAGNOSIS — Z6841 Body Mass Index (BMI) 40.0 and over, adult: Secondary | ICD-10-CM | POA: Diagnosis not present

## 2024-10-08 DIAGNOSIS — M4722 Other spondylosis with radiculopathy, cervical region: Secondary | ICD-10-CM | POA: Diagnosis not present

## 2024-10-08 DIAGNOSIS — G4486 Cervicogenic headache: Secondary | ICD-10-CM | POA: Diagnosis not present

## 2024-10-09 DIAGNOSIS — M25552 Pain in left hip: Secondary | ICD-10-CM | POA: Diagnosis not present

## 2024-10-09 DIAGNOSIS — M545 Low back pain, unspecified: Secondary | ICD-10-CM | POA: Diagnosis not present

## 2024-10-09 DIAGNOSIS — M25551 Pain in right hip: Secondary | ICD-10-CM | POA: Diagnosis not present

## 2024-10-12 DIAGNOSIS — M25559 Pain in unspecified hip: Secondary | ICD-10-CM | POA: Diagnosis not present

## 2024-10-13 ENCOUNTER — Ambulatory Visit: Admitting: Podiatry

## 2024-10-13 ENCOUNTER — Encounter: Payer: Self-pay | Admitting: Podiatry

## 2024-10-13 DIAGNOSIS — B070 Plantar wart: Secondary | ICD-10-CM | POA: Diagnosis not present

## 2024-10-13 NOTE — Progress Notes (Signed)
 Patient presents follow-up plantar verruca left foot.  Swelling is some pain with JAP.   Physical exam:  General appearance: Pleasant, and in no acute distress. AOx3.  Vascular: Pedal pulses: DP 2/4 bilaterally, PT 2/4 bilaterally.  Minimal edema lower legs bilaterally. Capillary fill time immediate bilaterally.  Neurological: Resting intact bilaterally  Dermatologic:   This lesion plantar lateral foot left almost resolved.  Distal 1 mm diameter verrucous lesion still remaining with skin lines still going around.  Small pinpoint hemorrhage on debridement skin normal temperature bilaterally.  Skin normal color, tone, and texture bilaterally.   Musculoskeletal:    Diagnosis: 1.  Plantar verruca left-almost resolved  Plan: -Given how small the lesion is we will do another treatment with Salinocaine.  I think this will resolve it.  If it comes back or still present after several weeks I would recommend a punch biopsy of the lesion  -Applied Salinocaine compound to lesion(s) as noted in physical exam after debriding lesions to pinpoint bleeding.  Salinocaine applied to lesion(s) and covered with an occlusive dressing with Coban wrap.  Written and oral instructions given to patient.    Return prn

## 2024-10-19 DIAGNOSIS — M542 Cervicalgia: Secondary | ICD-10-CM | POA: Diagnosis not present

## 2024-10-21 DIAGNOSIS — M25559 Pain in unspecified hip: Secondary | ICD-10-CM | POA: Diagnosis not present

## 2024-10-26 ENCOUNTER — Ambulatory Visit: Admitting: Neurology

## 2024-10-27 DIAGNOSIS — R2689 Other abnormalities of gait and mobility: Secondary | ICD-10-CM | POA: Diagnosis not present

## 2024-10-27 NOTE — Progress Notes (Unsigned)
 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  Also may be cervicogenic. 2  Chronic migraine without aura, without status migrainosus, not intractable - cervicogenic 3  Cervical spinal stenosis and spondylosis 4  Hypertension   Check MRI of brain to rule out secondary intracranial etiology. Continue physical therapy for neck. If MRI unrevealing, consider starting treatment for vestibular migraine - would recommend topiramate 25mg  at bedtime.  Alternatively, lamotrigine may be helpful in treating chronic migraine aura symptoms. Rizatriptan  10mg  and Zofran  4mg  for acute migraines/nausea. Follow up with PCP regarding BP Follow up 6 months.       Subjective:  Carolyn Mcguire is a 68 year old female with HTN, diastolic dysfunction, HLD, hypothyroidism who follows up for migraines.  MRI C-spine personally reviewed.  UPDATE: Due to ongoing headache, neck pain dizziness and gait instability, she had MRI of cervical spine without contrast on 07/06/2024 which confirmed moderate to severe spinal canal stenosis at C3-C4 with mild cord compression as well as moderate spinal canal stenosis at C5-C6 with mild ventral cord flattening, mild spinal canal stenosis at C6-C7 without cord contact, and multilevel foraminal stenosis greatest and severe on the right at C5-6.  She was referred to neurosurgery who recommended conservative management first with physical therapy and Celebrex .  She just recently started physical therapy.  Takes Celebrex  only once every 2 weeks.  Also treats with ice.    3 weeks ago she started experiencing feeling unsteady on her feet.  Her head doesn't feel right, it is like a pressure-sensation.  It is not vertigo like spinning.  She finds herself stumble or veer to either side  when ambulating.  Symptoms are pretty much persistent.  Notes associated nausea, photophobia, phonophobia.  No change in her chronic tinnitus.  No double vision.  She saw ENT who did not suspect inner ear etiology.   Current NSAIDS/analgesics:  Tylenol  Arthritis (once every 2 weeks) Current triptans:  rizatriptan -MLT 10mg  Current ergotamine:  none Current anti-emetic:  none Current muscle relaxants:  none Current Antihypertensive medications:  metoprolol  succinate, amlodipine ,losartan  Current Antidepressant medications:  none Current Anticonvulsant medications:  none Current anti-CGRP:  none Current Vitamins/Herbal/Supplements:  CoQ10, KCl Current Antihistamines/Decongestants:  none Other therapy:  cold packs, hot shower Hormone/birth control:  none Has seen ENT for stapedius myoclonus.  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  Caffeine:  No coffee.  Drinks tea 1 to 2 times a week Diet:  Tries to increase water intake.  No soda.  She says she needs to lose weight.   Exercise: trying to walk more.   Depression:  some; Anxiety:  some Other pain:  generalized aches, arthritis Sleep hygiene:  Has mild to moderate OSA.  Does not use CPAP.  Sleep is good. 4-6 hours.  Feels tired in the day.  HISTORY: Onset:  Since around 68 years old Location:  diffuse, occasionally just temples or back of head.  Some neck pain (has arthritis in neck) Quality:  pounding Intensity:  8/10.  Aura:  absent Prodrome:  absent Associated symptoms:  Nausea, photophobia, phonophobia.  She denies associated visual disturbance, unilateral numbness or weakness. Duration:  a couple of hours to all day Frequency:  2 days a week Frequency of abortive medication:  Tylenol  Arthritis once a week.  Rarely rizatriptan  Triggers:  maybe change  in weather, stress Relieving factors:  rest in dark room, hot shower, cool pack to head.   Activity:  aggravates - rests She does carry a baseline low-grade  pressure  headache She also has chronic tinnitus of several years- constant ringing, roaring   MRI of brain without contrast on 02/09/2020 was normal.  Longstanding history of tinnitus and episodic positional vertigo.  MRI of internal auditory canals/posterior fossa with and without contrast on 04/25/2016 was unremarkable but incidentally demonstrated degenerative changes at C3-4 resulting in moderate spinal canal and bilateral foraminal stenosis.  Has previously responded to vestibular rehab.     Past NSAIDS/analgesics:  naproxen , meloxicam Past abortive triptans:  sumatriptan  50mg , rizatriptan  10mg  Past abortive ergotamine:  none Past muscle relaxants:  baclofen , Robaxin  Past anti-emetic:  Zofran  8mg  Past antihypertensive medications:  furosemide , valsartan, HCTZ Past antidepressant medications:  venlafaxine , sertraline, bupropion Past anticonvulsant medications: gabapentin  (leg swelling) Past anti-CGRP:  Ubrelvy (cannot swallow the tablet) Past vitamins/Herbal/Supplements:  none Past antihistamines/decongestants:  none Other past therapies:  trigger point injections in back of head/neck She does not want to try CGRP inhibitors, Botox  Family history of headache:  No   PAST MEDICAL HISTORY: Past Medical History:  Diagnosis Date   Adenomatous colon polyp    Allergic rhinitis    Animal bite 05/2015   fox    Anxiety    Arthritis    shoulder   Carpal tunnel syndrome    RIGHT HAND   Depression    Diastolic dysfunction    a. 2D echo 09/08/15: EF 55-60%, grade 1 DD, elevated LVEDP, mild MR, mild TR. - normal BNP 08/2015.   Essential hypertension    GERD (gastroesophageal reflux disease)    Headache    Heart murmur    Hepatic steatosis    Hyperlipidemia    Hypothyroidism    LBBB (left bundle branch block)    a. negative nuclear stress test 06/2011.   Migraines    Obesity    OSA (obstructive sleep apnea)    mild   Prediabetes    Tinnitus    Varicose vein of leg    Vitamin D  deficiency     MEDICATIONS: Current Outpatient Medications on File Prior to Visit  Medication Sig Dispense Refill   amLODipine  (NORVASC ) 10 MG tablet Take 10 mg by mouth daily.     amoxicillin -clavulanate (AUGMENTIN ) 875-125 MG tablet Take 1 tablet by mouth every 12 (twelve) hours. 14 tablet 0   celecoxib  (CELEBREX ) 100 MG capsule Take 100 mg by mouth daily.     Cholecalciferol (VITAMIN D3) 1000 units CAPS Take by mouth. One daily     Co-Enzyme Q10 100 MG CAPS Take 100 mg by mouth 2 (two) times daily.     dicyclomine  (BENTYL ) 20 MG tablet Take 0.5 tablets (10 mg total) by mouth 2 (two) times daily as needed for spasms. 20 tablet 0   levothyroxine (SYNTHROID) 88 MCG tablet TAKE 1 TABLET BY MOUTH EVERY MORNING ON AN EMPTY STOMACH Orally Once a day     losartan  (COZAAR ) 100 MG tablet Take 100 mg by mouth daily. as directed     metoprolol  succinate (TOPROL -XL) 50 MG 24 hr tablet TAKE 1 TABLET BY MOUTH ONCE A DAY. TEV[employer]ER ONLY Orally Once a day     ondansetron  (ZOFRAN ) 4 MG tablet Take 1 tablet (4 mg total) by mouth every 8 (eight) hours as needed for nausea or vomiting. 12 tablet 0   ondansetron  (ZOFRAN -ODT) 4 MG disintegrating tablet 4mg   ODT q4 hours prn nausea/vomit 20 tablet 0   predniSONE  (DELTASONE ) 5 MG tablet prednisone  5 mg, 30 mg p.o. daily first day, then decrease by 5 mg every other day for 12 days. 42 tablet 1   predniSONE  (DELTASONE ) 5 MG tablet prednisone  5 mg, 30 mg p.o. daily first day, then decrease by 5 mg every other day for 12 days. 42 tablet 0   rizatriptan  (MAXALT -MLT) 10 MG disintegrating tablet DISSOLVE 1 TABLET IN MOUTH AS NEEDED FOR MIGRAINE. MAY REPEAT IN 2 HOURS IF NEEDED 6 tablet 0   rosuvastatin (CRESTOR) 10 MG tablet 1 tablet Orally Once a day; Duration: 90 days     No current facility-administered medications on file prior to visit.     ALLERGIES: Allergies  Allergen Reactions   Elemental Sulfur Rash    MAKES PT RED    FAMILY HISTORY: Family  History  Problem Relation Age of Onset   Neuropathy Mother    Parkinsonism Mother    Ovarian cancer Mother        dx. 44-59; TAH-BSO at 54   Diabetes Mother    Pancreatic cancer Mother 33       d. 37   Skin cancer Mother        maybe basal cell carcinoma   Obesity Mother    Heart disease Father    Bladder Cancer Father        dx 59-81   Skin cancer Father        possibly melanoma; +sun exposure   Cancer Brother 5       salivary gland cancer; d. 8; no tobacco use   Breast cancer Maternal Aunt        dx unspecified age; s/p mastectomy   Other Maternal Aunt        intellectual disabilities   Heart failure Maternal Aunt 86       acute diastolic heart failure   Colon cancer Maternal Uncle        dx late 54s; d. early 70s   Heart attack Maternal Uncle 31   Cancer Paternal Uncle        CLL, dx. 62   Ovarian cancer Maternal Grandmother        d. 35; w/ mets   Heart attack Maternal Grandfather        d. 72   Esophageal cancer Neg Hx    Rectal cancer Neg Hx    Stomach cancer Neg Hx       Objective:  Blood pressure (!) 150/76, pulse 69, height 5' 4 (1.626 m), weight 243 lb 6.4 oz (110.4 kg), SpO2 98%. General: No acute distress.  Patient appears well-groomed.   Head:  Normocephalic/atraumatic Neck:  Supple.  No paraspinal tenderness.  Full range of motion. Heart:  Regular rate and rhythm. Neuro:  Alert and oriented.  Speech fluent and not dysarthric.  Language intact.  CN II-XII intact.  Bulk and tone normal.  Muscle strength 5/5 throughout.  Sensation to light touch intact.  Deep tendon reflexes 2+ throughout, toes downgoing.  Gait cautious, wide based and unsteady.  Unable to tandem walk.  Romberg with sway.      Juliene Dunnings, DO  CC: Ryan Hives, MD

## 2024-10-28 ENCOUNTER — Ambulatory Visit: Admitting: Neurology

## 2024-10-28 ENCOUNTER — Encounter: Payer: Self-pay | Admitting: Neurology

## 2024-10-28 VITALS — BP 150/76 | HR 69 | Ht 64.0 in | Wt 243.4 lb

## 2024-10-28 DIAGNOSIS — I10 Essential (primary) hypertension: Secondary | ICD-10-CM

## 2024-10-28 DIAGNOSIS — M4802 Spinal stenosis, cervical region: Secondary | ICD-10-CM

## 2024-10-28 DIAGNOSIS — G43709 Chronic migraine without aura, not intractable, without status migrainosus: Secondary | ICD-10-CM

## 2024-10-28 DIAGNOSIS — R270 Ataxia, unspecified: Secondary | ICD-10-CM | POA: Diagnosis not present

## 2024-10-28 MED ORDER — RIZATRIPTAN BENZOATE 10 MG PO TBDP: 10.0000 mg | ORAL_TABLET | ORAL | 5 refills | Status: AC | PRN

## 2024-10-28 MED ORDER — ONDANSETRON HCL 4 MG PO TABS: 4.0000 mg | ORAL_TABLET | Freq: Three times a day (TID) | ORAL | 5 refills | Status: AC | PRN

## 2024-10-28 NOTE — Patient Instructions (Addendum)
 Check MRI of brain with and without contrast. We have sent a referral to [facility] for your MRI and they will call you directly to schedule your appointment. They are located at [address] [facility]. If you need to contact them directly please call [phone].  Rizatriptan  and ondansetron  filled Continue physical therapy for neck If MRI unremarkable, consider treating for chronic migraine with TOPIRAMATE. LAMOTRIGINE is another option Follow up 6 months

## 2024-11-02 DIAGNOSIS — M542 Cervicalgia: Secondary | ICD-10-CM | POA: Diagnosis not present

## 2024-11-02 DIAGNOSIS — R7303 Prediabetes: Secondary | ICD-10-CM | POA: Diagnosis not present

## 2024-11-02 DIAGNOSIS — E039 Hypothyroidism, unspecified: Secondary | ICD-10-CM | POA: Diagnosis not present

## 2024-11-03 ENCOUNTER — Ambulatory Visit: Admitting: Nurse Practitioner

## 2024-11-03 ENCOUNTER — Encounter: Payer: Self-pay | Admitting: Nurse Practitioner

## 2024-11-03 ENCOUNTER — Telehealth: Payer: Self-pay | Admitting: Pharmacy Technician

## 2024-11-03 VITALS — BP 124/78 | HR 68 | Ht 64.0 in | Wt 242.0 lb

## 2024-11-03 DIAGNOSIS — K5732 Diverticulitis of large intestine without perforation or abscess without bleeding: Secondary | ICD-10-CM | POA: Diagnosis not present

## 2024-11-03 DIAGNOSIS — Z860101 Personal history of adenomatous and serrated colon polyps: Secondary | ICD-10-CM | POA: Diagnosis not present

## 2024-11-03 NOTE — Progress Notes (Signed)
 11/03/2024 Carolyn Mcguire [id number] 1956-03-18   Chief Complaint: Follow up diverticulitis   History of Present Illness: Carolyn Mcguire. Carolyn Mcguire is a 68 year old female with a past medical history of hypertension, hyperlipidemia, left bundle branch block, obstructive sleep apnea and diverticulitis.  She is known by Dr. Albertus.  She presents today for further evaluation regarding diverticulitis.  She was evaluated in the ED on 08/21/2024 with right lower abdominal pain.  CTAP identified acute sigmoid diverticulitis without abscess or perforation and she was prescribed a course of Augmentin .  She continued to have persistent right and left lower abdominal pain and presented back to the ED 09/24/2024.  Repeat CTAP with contrast showed sigmoid diverticulosis without acute inflammation/diverticulitis and a probable small right ovarian dermoid.  She stated her PCP advised GI follow-up.  Patient stated her first episode of diverticulitis was 06/2022 which resolved after taking a course of antibiotics.  She recalled eating nuts and felt a little constipated for a few days prior to being diagnosed with diverticulitis 07/2024 as noted above.  Since then, she endorses passing several normal formed bowel movements daily and does not feel constipated.  She does not strain to pass a BM.  No bloody or black stools.  No further abdominal pain.  Her most recent colonoscopy was 10/16/2022 which showed diverticulosis in the sigmoid colon and internal hemorrhoids.  She was advised to repeat a colonoscopy in 10 years.  Prior colonoscopy 05/2017 identified 2 polyps removed from the colon, path report consistent with 1 sessile serrated polyp and fragments of a benign polypoid lymphoid aggregate.  Maternal uncle with history of colon cancer.     Latest Ref Rng & Units 09/24/2024    9:08 AM 08/21/2024   10:15 AM 10/18/2023   12:00 AM  CBC  WBC 4.0 - 10.5 K/uL 7.4  12.9  7.3      Hemoglobin 12.0 - 15.0 g/dL 86.2  86.8  86.0       Hematocrit 36.0 - 46.0 % 39.6  38.7  42      Platelets 150 - 400 K/uL 181  223  239         This result is from an external source.       Latest Ref Rng & Units 09/24/2024    9:08 AM 08/21/2024   10:15 AM 10/18/2023   12:00 AM  CMP  Glucose 70 - 99 mg/dL 869  859    BUN 8 - 23 mg/dL 13  11  14       Creatinine 0.44 - 1.00 mg/dL 9.20  9.18  0.9      Sodium 135 - 145 mmol/L 142  141  144      Potassium 3.5 - 5.1 mmol/L 3.7  3.9  4.0      Chloride 98 - 111 mmol/L 104  102  104      CO2 22 - 32 mmol/L 27  24  22       Calcium 8.9 - 10.3 mg/dL 9.9  9.9  9.3      Total Protein 6.5 - 8.1 g/dL 7.2  7.7    Total Bilirubin 0.0 - 1.2 mg/dL 1.2  1.5    Alkaline Phos 38 - 126 U/L 94  109  116      AST 15 - 41 U/L 20  17  20       ALT 0 - 44 U/L 11  9  15          This  result is from an external source.    CTAP with contrast 09/24/2024: FINDINGS:   LOWER CHEST: No acute abnormality.   LIVER: The liver is unremarkable.   GALLBLADDER AND BILE DUCTS: Gallbladder is unremarkable. No biliary ductal dilatation.   SPLEEN: No acute abnormality.   PANCREAS: No acute abnormality.   ADRENAL GLANDS: No acute abnormality.   KIDNEYS, URETERS AND BLADDER: No stones in the kidneys or ureters. No hydronephrosis. No perinephric or periureteral stranding. Urinary bladder is unremarkable.   GI AND BOWEL: Sigmoid diverticulosis without acute inflammation. Stomach demonstrates no acute abnormality. There is no bowel obstruction.   PERITONEUM AND RETROPERITONEUM: No ascites. No free air.   VASCULATURE: Aortic atherosclerosis stable. Aorta is normal in caliber.   LYMPH NODES: No lymphadenopathy.   REPRODUCTIVE ORGANS: Probable small right ovarian dermoid.   BONES AND SOFT TISSUES: No acute osseous abnormality. No focal soft tissue abnormality.   IMPRESSION: 1. No acute findings. 2. Sigmoid diverticulosis without acute inflammation. 3. Probable small right ovarian dermoid.    CTAP  with contrast 08/21/2024: FINDINGS: Lower chest: No acute pleural or parenchymal lung disease.   Hepatobiliary: No focal liver abnormality is seen. No gallstones, gallbladder wall thickening, or biliary dilatation.   Pancreas: Unremarkable. No pancreatic ductal dilatation or surrounding inflammatory changes.   Spleen: Normal in size without focal abnormality.   Adrenals/Urinary Tract: Adrenal glands are unremarkable. Kidneys are normal, without renal calculi, focal lesion, or hydronephrosis. Bladder is unremarkable.   Stomach/Bowel: No bowel obstruction or ileus. Sigmoid diverticulosis, with short segment wall thickening and pericolonic fat stranding consistent with sigmoid diverticulitis. No perforation, fluid collection, or abscess. Normal appendix right lower quadrant.   Vascular/Lymphatic: Aortic atherosclerosis. No enlarged abdominal or pelvic lymph nodes.   Reproductive: Uterus is unremarkable. There is a small 9 mm fat containing focus within the right adnexa, consistent with small right ovarian dermoid. This appears unchanged since prior study. Unremarkable left ovary.   Other: No free fluid or free intraperitoneal gas. No abdominal wall hernia.   Musculoskeletal: No acute or destructive bony abnormalities. Reconstructed images demonstrate no additional findings.   IMPRESSION: 1. Acute uncomplicated sigmoid diverticulitis. No perforation, fluid collection, or abscess. 2.  Aortic Atherosclerosis (ICD10-I70.0). 3. Incidental 9 mm right ovarian dermoid, unchanged since prior exam.     ECHO 01/23/2023: 1. Left ventricular ejection fraction, by estimation, is 60 to 65%. The  left ventricle has normal function. The left ventricle has no regional  wall motion abnormalities. Left ventricular diastolic parameters are  consistent with Grade I diastolic  dysfunction (impaired relaxation).   2. Right ventricular systolic function is normal. The right ventricular  size is  normal.   3. The mitral valve is normal in structure. Trivial mitral valve  regurgitation. No evidence of mitral stenosis.   4. The aortic valve is normal in structure. Aortic valve regurgitation is  trivial. Aortic valve sclerosis/calcification is present, without any  evidence of aortic stenosis.   5. The inferior vena cava is normal in size with greater than 50%  respiratory variability, suggesting right atrial pressure of 3 mmHg.   GI PROCEDURES:  Colonoscopy 10/16/2022: - Moderate diverticulosis in the sigmoid colon.  - Small internal hemorrhoids.  - The examination was otherwise normal.  - No specimens collected. - Recall colonoscopy 10 years   Colonoscopy 05/31/2017: - Two 4 to 7 mm polyps in the descending colon, removed with a cold snare. Resected and retrieved. - The examination was otherwise normal on direct and retroflexion views. -  SESSILE SERRATED POLYP WITHOUT CYTOLOGIC DYSPLASIA.  - FRAGMENT OF BENIGN POLYPOID COLORECTAL MUCOSA WITH LYMPHOID AGGREGATE.  EGD 12/28/2015: 1. The mucosa of the esophagus appeared normal 2. Nodular mucosa was found in the prepyloric region of the stomach; multiple biopsies were performed 3. The stomach otherwise appeared normal; multiple biopsies 4. Sessile polyp measuring 3 mm in size was found in the duodenal bulb; polypectomy was performed, otherwise normal duodenum  Current Outpatient Medications on File Prior to Visit  Medication Sig Dispense Refill   amLODipine  (NORVASC ) 10 MG tablet Take 10 mg by mouth daily.     celecoxib  (CELEBREX ) 100 MG capsule Take 100 mg by mouth daily. (Patient taking differently: Take 100 mg by mouth as needed.)     Cholecalciferol (VITAMIN D3) 1000 units CAPS Take by mouth. One daily     Co-Enzyme Q10 100 MG CAPS Take 100 mg by mouth 2 (two) times daily.     levothyroxine (SYNTHROID) 88 MCG tablet TAKE 1 TABLET BY MOUTH EVERY MORNING ON AN EMPTY STOMACH Orally Once a day     losartan  (COZAAR ) 100 MG tablet  Take 100 mg by mouth daily. as directed     metoprolol  succinate (TOPROL -XL) 50 MG 24 hr tablet TAKE 1 TABLET BY MOUTH ONCE A DAY. TEVA MANUFACTURER ONLY Orally Once a day     ondansetron  (ZOFRAN ) 4 MG tablet Take 1 tablet (4 mg total) by mouth every 8 (eight) hours as needed. 20 tablet 5   rizatriptan  (MAXALT -MLT) 10 MG disintegrating tablet Take 1 tablet (10 mg total) by mouth as needed for migraine. May repeat in 2 hours if needed.  Maximum 2 tablets in 24 hours. 9 tablet 5   rosuvastatin (CRESTOR) 10 MG tablet 1 tablet Orally Once a day; Duration: 90 days     predniSONE  (DELTASONE ) 5 MG tablet prednisone  5 mg, 30 mg p.o. daily first day, then decrease by 5 mg every other day for 12 days. (Patient not taking: Reported on 11/03/2024) 42 tablet 1   predniSONE  (DELTASONE ) 5 MG tablet prednisone  5 mg, 30 mg p.o. daily first day, then decrease by 5 mg every other day for 12 days. (Patient not taking: Reported on 11/03/2024) 42 tablet 0   No current facility-administered medications on file prior to visit.   Allergies  Allergen Reactions   Elemental Sulfur Rash    MAKES PT RED   Current Medications, Allergies, Past Medical History, Past Surgical History, Family History and Social History were reviewed in [employer] record.  Review of Systems:   Constitutional: Negative for fever, sweats, chills or weight loss.  Respiratory: Negative for shortness of breath.   Cardiovascular: Negative for chest pain, palpitations and leg swelling.  Gastrointestinal: See HPI.  Musculoskeletal: Negative for back pain or muscle aches.  Neurological: Negative for dizziness, headaches or paresthesias.   Physical Exam: BP 124/78   Pulse 68   Ht 5' 4 (1.626 m)   Wt 242 lb (109.8 kg)   BMI 41.54 kg/m   General: 68 year old female in no acute distress. Head: Normocephalic and atraumatic. Eyes: No scleral icterus. Conjunctiva pink . Ears: Normal auditory acuity. Mouth: Dentition intact.  No ulcers or lesions.  Lungs: Clear throughout to auscultation. Heart: Regular rate and rhythm, no murmur. Abdomen: Soft, nontender and nondistended. No masses or hepatomegaly. Normal bowel sounds x 4 quadrants.  Rectal: Deferred.  Musculoskeletal: Symmetrical with no gross deformities. Extremities: No edema. Neurological: Alert oriented x 4. No focal deficits.  Psychological: Alert and cooperative.  Normal mood and affect  Assessment and Recommendations:  68 year old female with a history of diverticulitis, first episode diagnosed 06/2022 which resolved after taking a course of antibiotics.  Second episode diagnosed per CT 07/2024, resolved after taking Augmentin  twice daily x 7 days. - Patient instructed to avoid constipation - Drink 64 ounces of water daily - Benefiber 1 tablespoon daily as tolerated - Miralax nightly as needed - Patient to contact our office if lower abdominal pain recurs - Her most recent colonoscopy 09/2022 showed diverticulosis in the sigmoid colon, no polyps.  Dr. Albertus to verify if an earlier colonoscopy warranted at this juncture secondary to episode of sigmoid diverticulitis 07/2024.  History of colon polyps.  Colonoscopy 05/2017 identified 2 polyps removed from the colon, path report consistent with 1 sessile serrated polyp and the other polyp biopsy showed fragment of benign polypoid colorectal mucosa with lymphoid aggregate.  Paternal uncle with history of colon cancer. - Next colonoscopy due 09/2032

## 2024-11-03 NOTE — Telephone Encounter (Signed)
 Pharmacy Patient Advocate Encounter   Received notification from CoverMyMeds that prior authorization for ONDANSETRON  4MG  is required/requested.   Insurance verification completed.   The patient is insured through [employer] [facility] [employer].   Per test claim: PA required; PA submitted to above mentioned insurance via Latent Key/confirmation #/EOC [id number] Status is pending

## 2024-11-03 NOTE — Patient Instructions (Signed)
 Take 1 tablespoon of Benefiber daily.  Take Miralax at bedtime as needed to avoid constipation.  Drink 64 ounces of water daily  Contact our office if your abdominal pain returns.  _______________________________________________________  If your blood pressure at your visit was 140/90 or greater, please contact your primary care physician to follow up on this.  _______________________________________________________  If you are age 66 or older, your body mass index should be between 23-30. Your Body mass index is 41.54 kg/m. If this is out of the aforementioned range listed, please consider follow up with your Primary Care Provider.  If you are age 67 or younger, your body mass index should be between 19-25. Your Body mass index is 41.54 kg/m. If this is out of the aformentioned range listed, please consider follow up with your Primary Care Provider.   ________________________________________________________  The [facility] providers would like to encourage you to use MYCHART to communicate with providers for non-urgent requests or questions.  Due to long hold times on the telephone, sending your provider a message by [facility] may be a faster and more efficient way to get a response.  Please allow 48 business hours for a response.  Please remember that this is for non-urgent requests.  _______________________________________________________  [facility] is using a team-based approach to care.  Your team is made up of your doctor and two to three APPS. Our APPS (Nurse Practitioners and Physician Assistants) work with your physician to ensure care continuity for you. They are fully qualified to address your health concerns and develop a treatment plan. They communicate directly with your gastroenterologist to care for you. Seeing the Advanced Practice Practitioners on your physician's team can help you by facilitating care more promptly, often allowing for earlier appointments,  access to diagnostic testing, procedures, and other specialty referrals.

## 2024-11-04 DIAGNOSIS — M25559 Pain in unspecified hip: Secondary | ICD-10-CM | POA: Diagnosis not present

## 2024-11-12 NOTE — Progress Notes (Signed)
 Pt notified via mychart

## 2024-11-12 NOTE — Progress Notes (Signed)
 Linda/Dottie: Please contact the patient and let her know Dr. Albertus reviewed her recent office visit and he did not assess she needed an earlier colonoscopy at this juncture.  However, please have the patient contact our office if she develops any further lower abdominal pain or if she is diagnosed with diverticulitis in the future by any provider.  Thank you

## 2024-11-27 ENCOUNTER — Other Ambulatory Visit

## 2024-12-11 ENCOUNTER — Ambulatory Visit
Admission: RE | Admit: 2024-12-11 | Discharge: 2024-12-11 | Disposition: A | Discharge status: Home / Self Care | Source: Ambulatory Visit | Attending: Neurology | Admitting: Neurology

## 2024-12-11 DIAGNOSIS — R270 Ataxia, unspecified: Secondary | ICD-10-CM

## 2024-12-11 MED ORDER — GADOPICLENOL 0.5 MMOL/ML IV SOLN
10.0000 mL | Freq: Once | INTRAVENOUS | Status: AC | PRN
  Administered 2024-12-11: 10 mL via INTRAVENOUS

## 2024-12-14 ENCOUNTER — Ambulatory Visit: Payer: Self-pay | Admitting: Neurology

## 2024-12-14 NOTE — Telephone Encounter (Signed)
 Pt cld back for results call

## 2024-12-14 NOTE — Progress Notes (Signed)
 LMOVM for patient to call the office back.

## 2024-12-14 NOTE — Progress Notes (Signed)
 Patient advised. Will hold off on her starting a medication for now. She has an appt with Neuro Surgery  on Wednesday.

## 2024-12-24 ENCOUNTER — Ambulatory Visit: Admitting: Neurology

## 2025-01-14 ENCOUNTER — Ambulatory Visit: Admitting: Internal Medicine

## 2025-04-28 ENCOUNTER — Ambulatory Visit: Admitting: Neurology
